# Patient Record
Sex: Female | Born: 1985 | Race: White | Hispanic: No | Smoking: Current every day smoker
Health system: Southern US, Community
[De-identification: ages and names within clinical notes are randomized; demographics above are authoritative.]

## PROBLEM LIST (undated history)

## (undated) ENCOUNTER — Inpatient Hospital Stay (HOSPITAL_COMMUNITY): Payer: Self-pay

## (undated) DIAGNOSIS — J449 Chronic obstructive pulmonary disease, unspecified: Secondary | ICD-10-CM

## (undated) DIAGNOSIS — F419 Anxiety disorder, unspecified: Secondary | ICD-10-CM

## (undated) DIAGNOSIS — F1020 Alcohol dependence, uncomplicated: Secondary | ICD-10-CM

## (undated) DIAGNOSIS — F319 Bipolar disorder, unspecified: Secondary | ICD-10-CM

## (undated) DIAGNOSIS — E669 Obesity, unspecified: Secondary | ICD-10-CM

## (undated) DIAGNOSIS — K759 Inflammatory liver disease, unspecified: Secondary | ICD-10-CM

## (undated) HISTORY — PX: TONSILLECTOMY AND ADENOIDECTOMY: SUR1326

## (undated) HISTORY — DX: Obesity, unspecified: E66.9

## (undated) HISTORY — DX: Anxiety disorder, unspecified: F41.9

## (undated) HISTORY — DX: Alcohol dependence, uncomplicated: F10.20

## (undated) HISTORY — PX: TEMPOROMANDIBULAR JOINT ARTHROPLASTY: SUR76

## (undated) HISTORY — DX: Chronic obstructive pulmonary disease, unspecified: J44.9

## (undated) HISTORY — PX: GASTRIC BYPASS: SHX52

## (undated) HISTORY — PX: CHOLECYSTECTOMY: SHX55

## (undated) HISTORY — PX: REMOVAL OF EAR TUBE: SHX6057

## (undated) HISTORY — PX: TMJ ARTHROPLASTY: SHX1066

## (undated) HISTORY — PX: OTHER SURGICAL HISTORY: SHX169

## (undated) HISTORY — PX: TUBAL LIGATION: SHX77

---
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
## 2003-01-06 ENCOUNTER — Inpatient Hospital Stay (HOSPITAL_COMMUNITY): Admission: EM | Admit: 2003-01-06 | Discharge: 2003-01-09 | Payer: Self-pay | Admitting: Emergency Medicine

## 2003-01-31 ENCOUNTER — Emergency Department (HOSPITAL_COMMUNITY): Admission: EM | Admit: 2003-01-31 | Discharge: 2003-01-31 | Payer: Self-pay | Admitting: Emergency Medicine

## 2003-06-11 ENCOUNTER — Emergency Department (HOSPITAL_COMMUNITY): Admission: EM | Admit: 2003-06-11 | Discharge: 2003-06-11 | Payer: Self-pay | Admitting: Emergency Medicine

## 2003-06-21 ENCOUNTER — Emergency Department (HOSPITAL_COMMUNITY): Admission: EM | Admit: 2003-06-21 | Discharge: 2003-06-21 | Payer: Self-pay | Admitting: Emergency Medicine

## 2004-01-03 ENCOUNTER — Emergency Department (HOSPITAL_COMMUNITY): Admission: EM | Admit: 2004-01-03 | Discharge: 2004-01-03 | Payer: Self-pay | Admitting: Emergency Medicine

## 2004-01-21 ENCOUNTER — Emergency Department (HOSPITAL_COMMUNITY): Admission: EM | Admit: 2004-01-21 | Discharge: 2004-01-21 | Payer: Self-pay | Admitting: Emergency Medicine

## 2004-05-05 ENCOUNTER — Emergency Department (HOSPITAL_COMMUNITY): Admission: EM | Admit: 2004-05-05 | Discharge: 2004-05-05 | Payer: Self-pay | Admitting: Emergency Medicine

## 2004-05-25 ENCOUNTER — Inpatient Hospital Stay (HOSPITAL_COMMUNITY): Admission: AD | Admit: 2004-05-25 | Discharge: 2004-05-25 | Payer: Self-pay | Admitting: Family Medicine

## 2004-05-28 ENCOUNTER — Inpatient Hospital Stay (HOSPITAL_COMMUNITY): Admission: AD | Admit: 2004-05-28 | Discharge: 2004-05-28 | Payer: Self-pay | Admitting: Obstetrics and Gynecology

## 2004-05-29 ENCOUNTER — Inpatient Hospital Stay (HOSPITAL_COMMUNITY): Admission: AD | Admit: 2004-05-29 | Discharge: 2004-05-29 | Payer: Self-pay | Admitting: Obstetrics & Gynecology

## 2004-06-23 ENCOUNTER — Other Ambulatory Visit: Admission: RE | Admit: 2004-06-23 | Discharge: 2004-06-23 | Payer: Self-pay | Admitting: Obstetrics and Gynecology

## 2004-09-03 ENCOUNTER — Inpatient Hospital Stay (HOSPITAL_COMMUNITY): Admission: AD | Admit: 2004-09-03 | Discharge: 2004-09-03 | Payer: Self-pay | Admitting: Obstetrics & Gynecology

## 2005-01-12 ENCOUNTER — Ambulatory Visit (HOSPITAL_COMMUNITY): Admission: RE | Admit: 2005-01-12 | Discharge: 2005-01-12 | Payer: Self-pay | Admitting: Obstetrics and Gynecology

## 2005-01-13 ENCOUNTER — Inpatient Hospital Stay (HOSPITAL_COMMUNITY): Admission: RE | Admit: 2005-01-13 | Discharge: 2005-01-15 | Payer: Self-pay | Admitting: Obstetrics and Gynecology

## 2006-01-02 ENCOUNTER — Emergency Department (HOSPITAL_COMMUNITY): Admission: EM | Admit: 2006-01-02 | Discharge: 2006-01-03 | Payer: Self-pay | Admitting: Emergency Medicine

## 2006-01-04 ENCOUNTER — Emergency Department (HOSPITAL_COMMUNITY): Admission: EM | Admit: 2006-01-04 | Discharge: 2006-01-04 | Payer: Self-pay | Admitting: Emergency Medicine

## 2006-01-06 ENCOUNTER — Inpatient Hospital Stay (HOSPITAL_COMMUNITY): Admission: AD | Admit: 2006-01-06 | Discharge: 2006-01-06 | Payer: Self-pay | Admitting: Gynecology

## 2008-03-17 ENCOUNTER — Emergency Department (HOSPITAL_COMMUNITY): Admission: EM | Admit: 2008-03-17 | Discharge: 2008-03-17 | Payer: Self-pay | Admitting: Emergency Medicine

## 2008-07-10 ENCOUNTER — Emergency Department (HOSPITAL_COMMUNITY): Admission: EM | Admit: 2008-07-10 | Discharge: 2008-07-10 | Payer: Self-pay | Admitting: Emergency Medicine

## 2008-07-25 ENCOUNTER — Inpatient Hospital Stay (HOSPITAL_COMMUNITY): Admission: AD | Admit: 2008-07-25 | Discharge: 2008-07-25 | Payer: Self-pay | Admitting: Obstetrics & Gynecology

## 2008-08-01 ENCOUNTER — Inpatient Hospital Stay (HOSPITAL_COMMUNITY): Admission: AD | Admit: 2008-08-01 | Discharge: 2008-08-01 | Payer: Self-pay | Admitting: Obstetrics and Gynecology

## 2008-08-14 ENCOUNTER — Emergency Department (HOSPITAL_COMMUNITY): Admission: EM | Admit: 2008-08-14 | Discharge: 2008-08-14 | Payer: Self-pay | Admitting: Emergency Medicine

## 2008-08-27 ENCOUNTER — Emergency Department (HOSPITAL_COMMUNITY): Admission: EM | Admit: 2008-08-27 | Discharge: 2008-08-28 | Payer: Self-pay | Admitting: Emergency Medicine

## 2008-08-30 ENCOUNTER — Ambulatory Visit (HOSPITAL_COMMUNITY): Admission: RE | Admit: 2008-08-30 | Discharge: 2008-08-30 | Payer: Self-pay | Admitting: Obstetrics and Gynecology

## 2008-09-10 ENCOUNTER — Inpatient Hospital Stay (HOSPITAL_COMMUNITY): Admission: AD | Admit: 2008-09-10 | Discharge: 2008-09-10 | Payer: Self-pay | Admitting: Obstetrics & Gynecology

## 2008-09-23 ENCOUNTER — Emergency Department (HOSPITAL_COMMUNITY): Admission: EM | Admit: 2008-09-23 | Discharge: 2008-09-23 | Payer: Self-pay | Admitting: Emergency Medicine

## 2009-01-17 ENCOUNTER — Inpatient Hospital Stay (HOSPITAL_COMMUNITY): Admission: RE | Admit: 2009-01-17 | Discharge: 2009-01-19 | Payer: Self-pay | Admitting: Obstetrics and Gynecology

## 2010-06-04 LAB — CBC
HCT: 31.1 % — ABNORMAL LOW (ref 36.0–46.0)
HCT: 38.1 % (ref 36.0–46.0)
Hemoglobin: 10.3 g/dL — ABNORMAL LOW (ref 12.0–15.0)
Hemoglobin: 12.6 g/dL (ref 12.0–15.0)
MCHC: 33.2 g/dL (ref 30.0–36.0)
MCHC: 33.2 g/dL (ref 30.0–36.0)
MCV: 82.6 fL (ref 78.0–100.0)
MCV: 83.9 fL (ref 78.0–100.0)
Platelets: 204 10*3/uL (ref 150–400)
Platelets: 221 10*3/uL (ref 150–400)
RBC: 3.71 MIL/uL — ABNORMAL LOW (ref 3.87–5.11)
RBC: 4.61 MIL/uL (ref 3.87–5.11)
RDW: 13.9 % (ref 11.5–15.5)
RDW: 14.2 % (ref 11.5–15.5)
WBC: 10 10*3/uL (ref 4.0–10.5)
WBC: 9.9 10*3/uL (ref 4.0–10.5)

## 2010-06-04 LAB — RPR: RPR Ser Ql: NONREACTIVE

## 2010-06-08 LAB — COMPREHENSIVE METABOLIC PANEL
ALT: 16 U/L (ref 0–35)
AST: 20 U/L (ref 0–37)
Albumin: 3.1 g/dL — ABNORMAL LOW (ref 3.5–5.2)
Alkaline Phosphatase: 51 U/L (ref 39–117)
BUN: 4 mg/dL — ABNORMAL LOW (ref 6–23)
CO2: 24 mEq/L (ref 19–32)
Calcium: 8.8 mg/dL (ref 8.4–10.5)
Chloride: 105 mEq/L (ref 96–112)
Creatinine, Ser: 0.48 mg/dL (ref 0.4–1.2)
GFR calc Af Amer: >60 mL/min (ref >60)
GFR calc non Af Amer: >60 mL/min (ref >60)
Glucose, Bld: 108 mg/dL — ABNORMAL HIGH (ref 70–99)
Potassium: 3.5 mEq/L (ref 3.5–5.1)
Sodium: 136 mEq/L (ref 135–145)
Total Bilirubin: 0.6 mg/dL (ref 0.3–1.2)
Total Protein: 6.1 g/dL (ref 6.0–8.3)

## 2010-06-08 LAB — CBC
HCT: 36.1 % (ref 36.0–46.0)
Hemoglobin: 12.4 g/dL (ref 12.0–15.0)
MCHC: 34.3 g/dL (ref 30.0–36.0)
MCV: 84.9 fL (ref 78.0–100.0)
Platelets: 226 10*3/uL (ref 150–400)
RBC: 4.26 MIL/uL (ref 3.87–5.11)
RDW: 13.4 % (ref 11.5–15.5)
WBC: 9 10*3/uL (ref 4.0–10.5)

## 2010-06-09 LAB — WET PREP, GENITAL
Clue Cells Wet Prep HPF POC: NONE SEEN
Trich, Wet Prep: NONE SEEN
Yeast Wet Prep HPF POC: NONE SEEN

## 2010-06-09 LAB — URINALYSIS, ROUTINE W REFLEX MICROSCOPIC
Bilirubin Urine: NEGATIVE
Glucose, UA: NEGATIVE mg/dL
Ketones, ur: 40 mg/dL — AB
Leukocytes, UA: NEGATIVE
Nitrite: NEGATIVE
Protein, ur: NEGATIVE mg/dL
Specific Gravity, Urine: 1.02 (ref 1.005–1.030)
Urobilinogen, UA: 0.2 mg/dL (ref 0.0–1.0)
pH: 7 (ref 5.0–8.0)

## 2010-06-09 LAB — URINE MICROSCOPIC-ADD ON

## 2010-06-10 LAB — URINALYSIS, ROUTINE W REFLEX MICROSCOPIC
Bilirubin Urine: NEGATIVE
Glucose, UA: NEGATIVE mg/dL
Hgb urine dipstick: NEGATIVE
Ketones, ur: NEGATIVE mg/dL
Nitrite: NEGATIVE
Protein, ur: NEGATIVE mg/dL
Specific Gravity, Urine: >1.03 — ABNORMAL HIGH (ref 1.005–1.030)
Urobilinogen, UA: 0.2 mg/dL (ref 0.0–1.0)
pH: 6 (ref 5.0–8.0)

## 2010-06-10 LAB — GC/CHLAMYDIA PROBE AMP, GENITAL
Chlamydia, DNA Probe: NEGATIVE
GC Probe Amp, Genital: NEGATIVE

## 2010-06-10 LAB — WET PREP, GENITAL
Clue Cells Wet Prep HPF POC: NONE SEEN
Trich, Wet Prep: NONE SEEN
Yeast Wet Prep HPF POC: NONE SEEN

## 2010-07-18 NOTE — Op Note (Signed)
NAMEVELINDA, Kathy Burns                 ACCOUNT NO.:  [id number]   MEDICAL RECORD NO.:  [id number]          PATIENT TYPE:  INP   LOCATION:  9125                          FACILITY:  [facility]   PHYSICIAN:  Malva Limes, M.D.    DATE OF BIRTH:  Jan 14, 1986   DATE OF PROCEDURE:  01/13/2005  DATE OF DISCHARGE:                                 OPERATIVE REPORT   PREOPERATIVE DIAGNOSIS:  1.  Intrauterine pregnancy at term.  2.  The patient strongly desires primary cesarean section.  3.  Suspected macrosomia.   POSTOPERATIVE DIAGNOSIS:  1.  Intrauterine pregnancy at term.  2.  The patient strongly desires primary cesarean section.  3.  Suspected macrosomia.   PROCEDURE:  Primary low transverse cesarean section.   SURGEON:  Earlene Plater, M.D.   ASSISTANT:  Luvenia Redden, M.D.   ANESTHESIA:  Spinal.   ANTIBIOTICS:  Ancef 1 gram.   ESTIMATED BLOOD LOSS:  900 mL.   COMPLICATIONS:  None.   SPECIMENS:  None.   FINDINGS:  The patient had normal fallopian tubes and ovaries bilaterally.  The uterus appeared to be normal.  The patient delivered one live viable  white female infant with Apgars of 9 at one minute and 9 at five minutes.  Weight was 8 pounds 5 ounces.   DESCRIPTION OF PROCEDURE:  The patient was taken to the operating room where  a spinal anesthetic was administered.  She was then placed in the dorsal  supine position with a left lateral tilt.  She was prepped with Betadine and  Foley catheter was placed.  She was then draped in the usual fashion for  this procedure.  Once an adequate level was reached, a Pfannenstiel incision  was made.  This was carried down to the fascia.  The fascia was entered in  the midline and extended laterally with the Mayo scissors.  The rectus  muscles were then dissected from the fascia with the Bovie.  The rectus  muscles were divided in the midline and taken superiorly and inferiorly.  The parietoperitoneum was entered sharply and taken superiorly  and  inferiorly.  The bladder flap was taken down sharply.  A low transverse  uterine incision was made in the midline and extended laterally with blunt  dissection.  The amniotic fluid was noted to be clear.  The infant was  delivered in the vertex presentation.  On delivery of the head, the  oropharynx and nostrils were bulb suctioned.  The remaining infant was then  delivered.  The cord was doubly clamped and cut and the infant handed to the  awaiting NICU team.  The placenta was manually removed and the uterus  exteriorized.  The uterine cavity was cleaned with a wet laparoscopy.  The  uterine incision was closed in a single layer of 0 Monocryl in a running  locking fashion.  The bladder flap was closed using 2-0 Monocryl in a  running fashion.  The uterus was placed back into the abdominal cavity.  Hemostasis was checked and found to be adequate.  Parietoperitoneum and  rectus muscles  were reapproximated in the midline using 2-0 Monocryl suture.  The rectus fascia was then closed using 0 Monocryl suture in a running  fashion.  Subcuticular tissue was made hemostatic with the Bovie.  Stainless  steel clips were used to close the skin.  The patient tolerated the  procedure well and she was taken to the recovery room in stable condition.  Needle, sponge, and instrument counts correct x2.           ______________________________  Malva Limes, M.D.     MA/MEDQ  D:  01/13/2005  T:  01/13/2005  Job:  [id number]

## 2010-07-18 NOTE — Discharge Summary (Signed)
Kathy Burns, Burns                 ACCOUNT NO.:  [id number]   MEDICAL RECORD NO.:  [id number]          Burns TYPE:  INP   LOCATION:  9125                          FACILITY:  [facility]   PHYSICIAN:  Randye Lobo, M.D.   DATE OF BIRTH:  11/25/1985   DATE OF ADMISSION:  01/13/2005  DATE OF DISCHARGE:  01/15/2005                                 DISCHARGE SUMMARY   FINAL DIAGNOSES:  1.  Intrauterine pregnancy at term.  2.  Suspected macrosomia.  3.  Kathy Burns desires primary cesarean section.   PROCEDURE:  Primary low transverse cesarean section. Surgeon Dr. Malva Limes.  Assistant Dr. Lodema Hong.   COMPLICATIONS:  None.   HISTORY OF PRESENT ILLNESS:  This 25 year old, G1, P0 presents at term  requesting a primary low transverse cesarean section. There is some  suspicion of macrosomia. Otherwise, Kathy Burns's antepartum course had been  uncomplicated. She did have a positive group B strep culture obtained in Kathy  office at 35 weeks. Kathy Burns also has a history of drug abuse, a history  of Kathy panic attacks and anxiety.  Kathy Burns was not on any medicines  during this pregnancy for her panic attacks.   HOSPITAL COURSE:  She was taken to Kathy operating room on November14,2006 by  Dr. Malva Limes where a primary low transverse cesarean section was  performed with Kathy delivery of an 8 pound 5 ounce female infant, with Apgars  of 9/9. Delivery went without complications. Kathy Burns's postoperative  course was benign without any significant fevers. She was felt ready for  discharge on postoperative day #2.   DIET:  She was sent home on a regular diet.   ACTIVITY:  Told to decrease activities.   DISCHARGE MEDICATIONS:  1.  She was told to continue her prenatal vitamins, and to make sure she is      getting enough calcium.  2.  She was given Percocet one to two every 4-6 hours as needed for pain.  3.  Told she could use ibuprofen up to 600 milligrams every 6 hours as  needed for pain.   FOLLOW UP:  She was to follow up in Kathy office in 4 weeks.   LABORATORIES ON DISCHARGE:  Kathy Burns had a hemoglobin of 11.2, white  blood cell count of 10.4 and platelets of 193,000.      Kathy Burns, P.A.-C.      Randye Lobo, M.D.  Electronically Signed    MB/MEDQ  D:  02/04/2005  T:  02/04/2005  Job:  [id number]
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
## 2010-07-18 NOTE — Discharge Summary (Signed)
Kathy Burns, Kathy Burns                             ACCOUNT NO.:  [id number]   MEDICAL RECORD NO.:  [id number]                   PATIENT TYPE:  LINP   LOCATION:                                       FACILITY:  [facility]   PHYSICIAN:  Katy Fitch. Sypher, M.D.              DATE OF BIRTH:  Oct 23, 1985   DATE OF ADMISSION:  01/06/2003  DATE OF DISCHARGE:  01/09/2003                                 DISCHARGE SUMMARY   ADMISSION DIAGNOSES:  1. Cellulitis, left hand and dorsal forearm secondary to history of     intravenous cocaine abuse.  2. Severe chronic and progressive cocaine abuse.  3. Relapse from substance abuse program.   OPERATION PERFORMED:  None.   CONSULTATIONS:  Psychiatry.   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  She spent  approximately eight month in a drug rehab program with a recent apparent  relapse.  She presented to [facility] Emergency Room with 3+  cellulitis of the left hand and dorsal forearm.  She was seen by the  emergency room physicians and a hand consultation was ordered.  They started  the patient on IV Unasyn.  Exam revealed 3+ cellulitis of the left hand with  multiple needle tracks on the dorsal and lower aspect of the left hand and  arm.  It was felt that the patient would need to be admitted.  Placed on IV  Unasyn and obtain a behavioral medicine consult.   LABORATORY DATA:  Hemoglobin of 13.3, hematocrit 39.7, white blood cell  count 12.9, and 333,000 platelets.  Differential reveals elevated  neutrophils at 79%, lymphs were low at 11, and monos were slightly high at  1.1.  X-ray of the left hand revealed no acute bony abnormality.   HOSPITAL COURSE:  The patient was seen and evaluated by Dr. Teressa Senter in the  emergency room.  She was admitted with the diagnosis of cellulitis of the  left  hand and forearm secondary to multiple injections of cocaine.  She was  placed on Unasyn 3 g IV q.6h., given Percocet for pain.  Started on the  Librium protocol for substance withdrawal, and a behavioral medicine consult  was called to the psychiatrist on call.  She was seen on the same day by  psychiatry.  They placed the patient on Ativan 2 mg IM or p.o. q.6h. p.r.n.  and Seroquel 100 mg p.o. q. day, q.4h. p.r.n. anxiety and agitation.  Apparently, they did not feel that she was a threat to herself.  The second  day postop on January 07, 2003, she was afebrile with stable vital signs.  The patient had been quite manipulative of the staff, disrupting the  patients care.  Maximum sedation was  supervised by Dr. Dub Mikes who is the  treating psychiatrist.  The patient was seen by Dr. Teressa Senter and the floor  nurse.  The patient requested IM meds, but this was discontinued.  On exam,  the patient was 4+ sedated, she was much calmer overall.  Complaining about  Dr. Dub Mikes not medicated her enough.  Left hand revealed decreased rubor and  edema.  Range of motion was 50% improved.  There was no signs of abscess  formation.  At this time, he felt that there was no indication for IV  morphine noted, felt that it was unsafe, and Dr. Stark Jock judgment with her  present level of sedation.  There was a great deal of improvement of the  cellulitis.  Her Unasyn was continued.  Her IM meds were discontinued.  We  felt that p.o. meds were reasonable for her pain control.  The patient was  started on a K-Pad for her left hand and arm.  The following day on January 08, 2003, the patient was complaining of severe pain of the left upper  extremity that p.o. medications were not relieving.  She stated that she  wished she could just get a shot in her IV to relax all her limbs and it  made her feel good and the pain goes away.  She had a fair appetite, she  was voiding.  On exam, she had 1+ swelling of the dorsum of the  hand.  There  was no lymphangitis.  Neurovascularly, she was grossly intact.  We discussed  with her the need to continue to elevate her left upper extremity.  Would  continue p.o. medications for pain only in anticipation of discharge home.  We spoke at length with Kathy Burns, R.N., case manager, concerning the  need for p.o. Augmentin for discharge.  On January 09, 2003, the patient  remained afebrile with stable vital signs.  She continued to complain of  moderate to severe pain.  She was taking a combination of Dilaudid, Ativan,  and Seroquel for pain relief.  She stated that she was so drowsy, but just  cannot sleep, wants something to kill all the pain.  On exam, she had less  swelling than on the previous day.  There was no erythema or lymphangitis.  She had full range of motion fo her digits, wrists, and elbow.  Plan at this  time:  It was felt that the patient was stable and ready for discharge home.  She was given a prescription for Tylox 5 mg.  After a lengthy discussion  concerning her pain she felt that this would be adequate for her.  Next was  Augmentin 875 mg #10 one p.o. b.i.d.  This prescription was filled by case  Production designer, theatre/television/film and social services at [facility].   ACTIVITY:  The patient will continue to keep her hand elevated and work on  range of motion of her fingers.   FOLLOWUP:  She will return to see Dr. Teressa Senter on Monday, January 15, 2003.  She will call [phone] for an appointment.   DIET:  No restrictions.   WOUND CARE:  She will keep her hand elevated.   CONDITION ON DISCHARGE:  Stable and improving.   FINAL DISCHARGE DIAGNOSIS:  Cellulitis of left upper extremity related to a  history of intravenous cocaine abuse.     Kathy Burns. Dasnoit, P.A.                   Katy Fitch  Sypher, M.D.    RJD/MEDQ  D:  02/15/2003  T:  02/15/2003  Job:  [id number]

## 2010-09-22 IMAGING — US US OB COMP +14 WK
2 series · 14 of 28 positions shown · non-contrast
Comparison: none

OBSTETRICAL ULTRASOUND:
 This ultrasound exam was performed in the [HOSPITAL] Ultrasound Department.  The OB US report was generated in the AS system, and faxed to the ordering physician.  This report is also available in [REDACTED] PACS.

[Series 1: us ob comp +14 wk · 12 of 47 slices shown (1 of 2)]
[im 3/47]
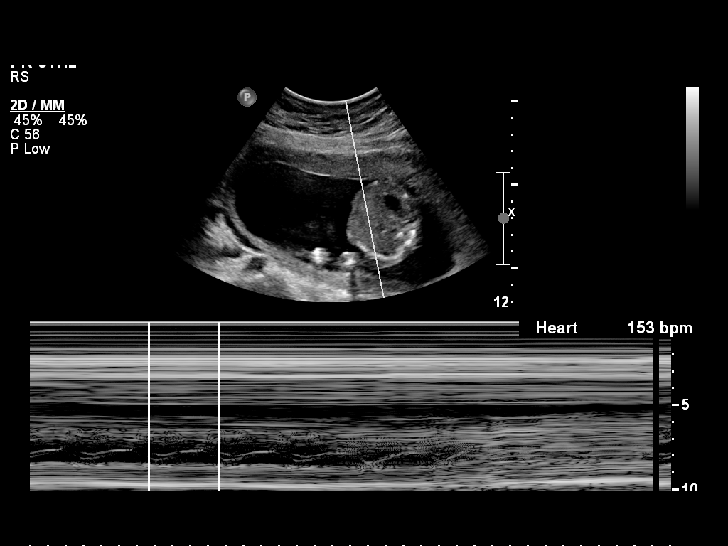
[im 7/47]
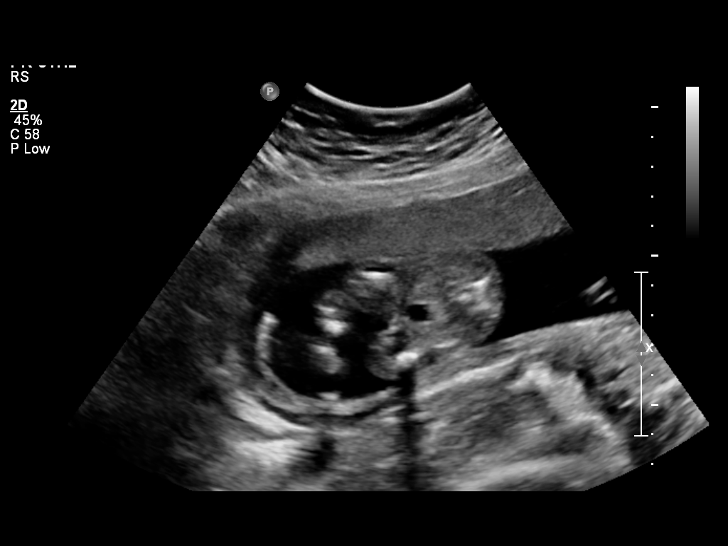
[im 11/47]
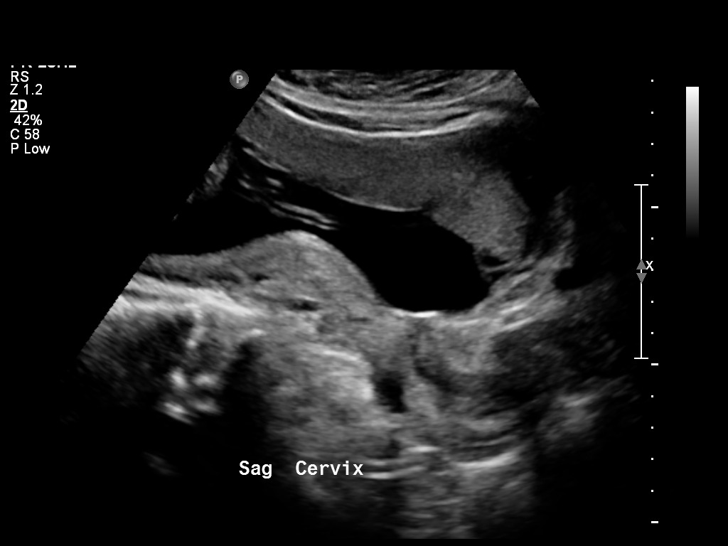
[im 15/47]
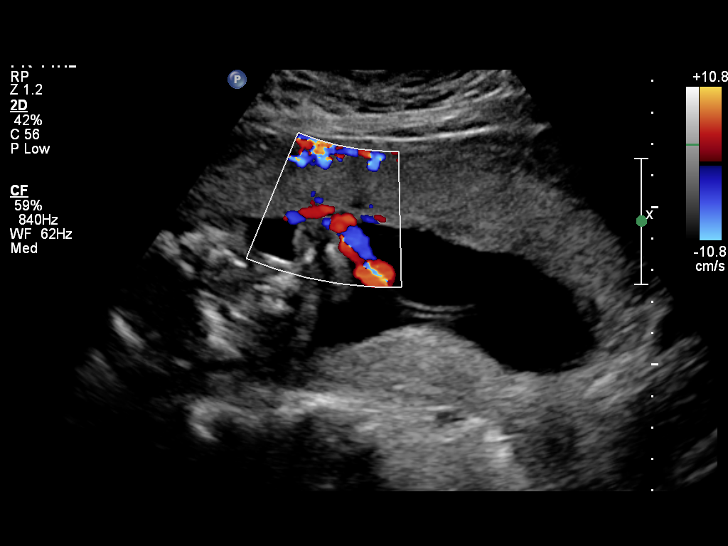
[im 19/47]
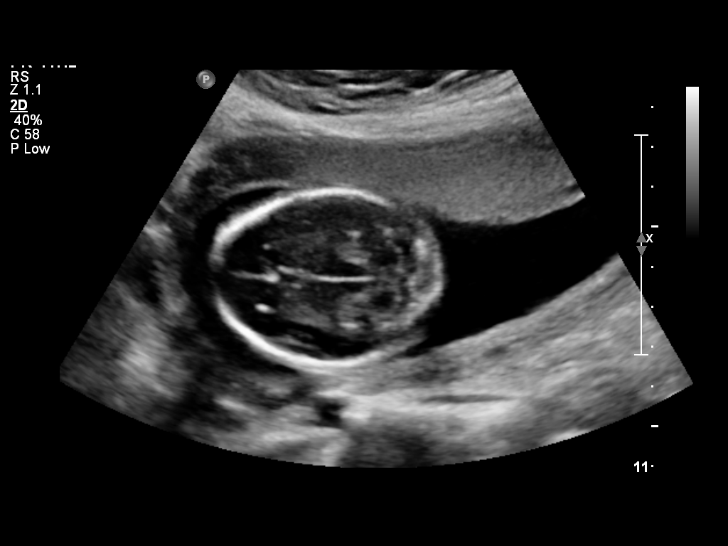
[im 23/47]
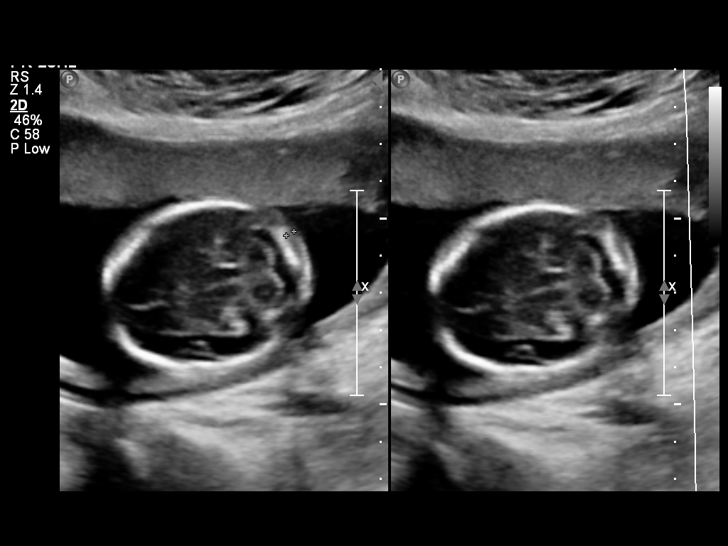
[im 27/47]
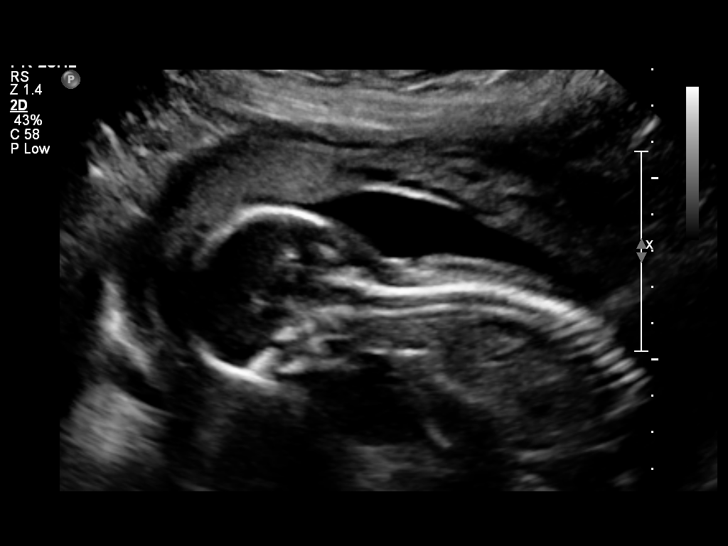
[im 31/47]
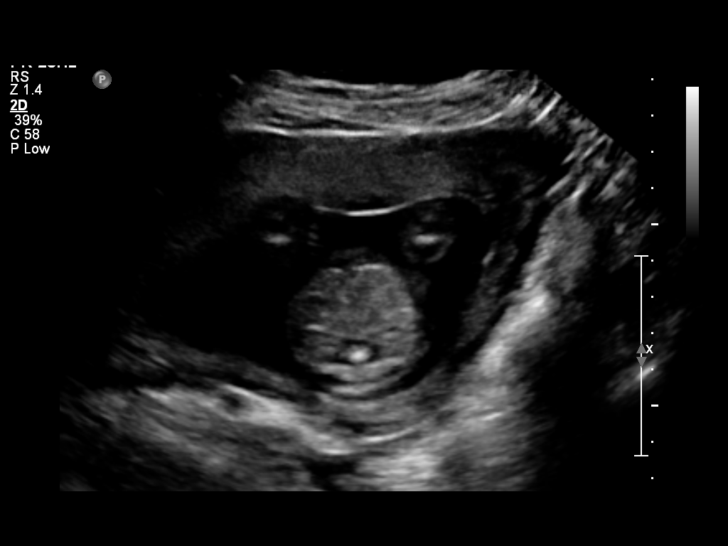
[im 35/47]
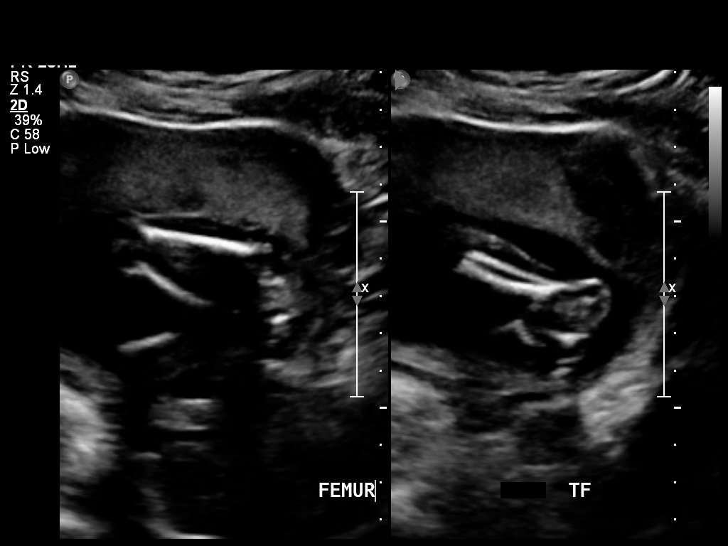
[im 39/47]
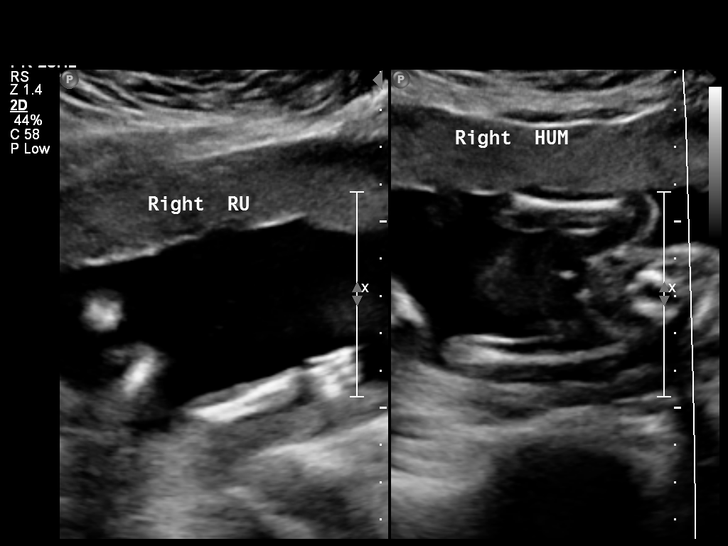
[im 43/47]
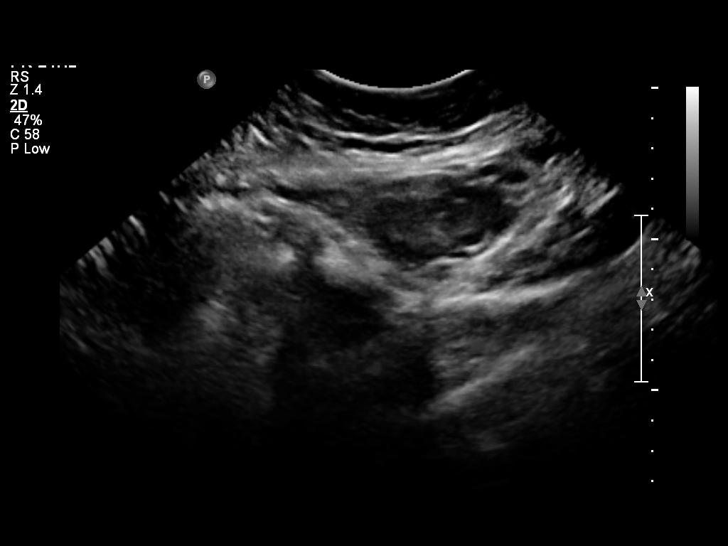
[im 47/47]
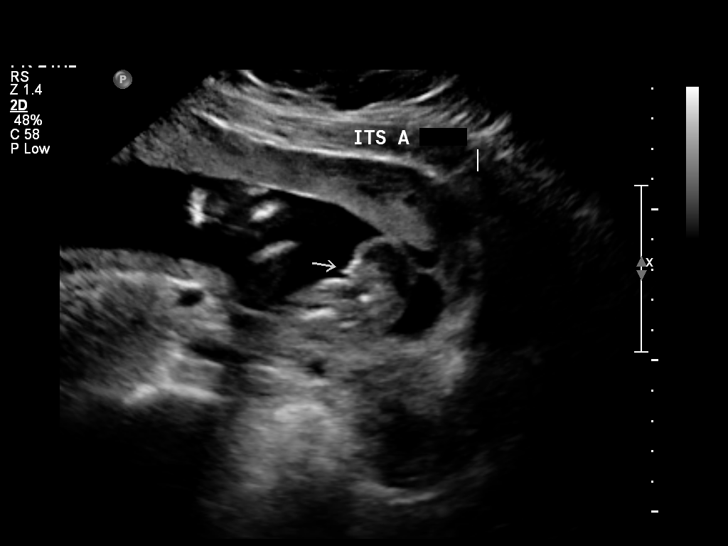

[Series 1: us ob comp +14 wk · 0.14mm/px · 2 of 7 slices shown (2 of 2)]
[im 3/7]
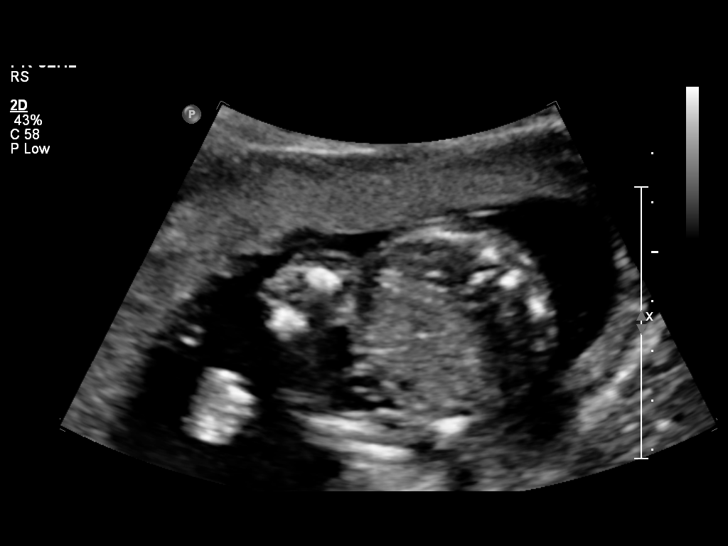
[im 7/7]
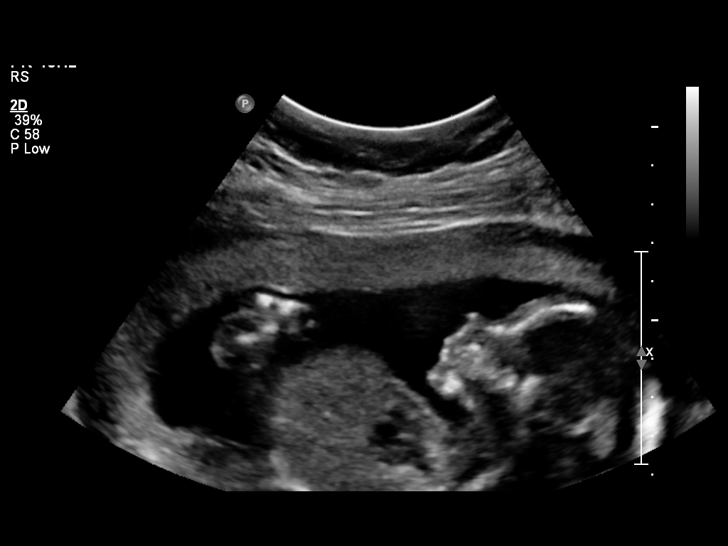

[14 of 28 positions shown; findings below may reference images not displayed]

IMPRESSION: See AS Obstetric US report.

## 2012-02-12 ENCOUNTER — Encounter (HOSPITAL_COMMUNITY): Payer: Self-pay | Admitting: *Deleted

## 2012-02-12 ENCOUNTER — Emergency Department (HOSPITAL_COMMUNITY)
Admission: EM | Admit: 2012-02-12 | Discharge: 2012-02-12 | Disposition: A | Discharge status: Home / Self Care | Payer: Self-pay | Attending: Emergency Medicine | Admitting: Emergency Medicine

## 2012-02-12 DIAGNOSIS — R209 Unspecified disturbances of skin sensation: Secondary | ICD-10-CM | POA: Insufficient documentation

## 2012-02-12 DIAGNOSIS — F1910 Other psychoactive substance abuse, uncomplicated: Secondary | ICD-10-CM

## 2012-02-12 DIAGNOSIS — F1110 Opioid abuse, uncomplicated: Secondary | ICD-10-CM | POA: Insufficient documentation

## 2012-02-12 DIAGNOSIS — F319 Bipolar disorder, unspecified: Secondary | ICD-10-CM | POA: Insufficient documentation

## 2012-02-12 DIAGNOSIS — R202 Paresthesia of skin: Secondary | ICD-10-CM

## 2012-02-12 DIAGNOSIS — F17200 Nicotine dependence, unspecified, uncomplicated: Secondary | ICD-10-CM | POA: Insufficient documentation

## 2012-02-12 HISTORY — DX: Bipolar disorder, unspecified: F31.9

## 2012-02-12 LAB — GLUCOSE, CAPILLARY: Glucose-Capillary: 108 mg/dL — ABNORMAL HIGH (ref 70–99)

## 2012-02-12 NOTE — ED Provider Notes (Signed)
History     CSN: [id number]  Arrival date & time 02/12/12  1849   None     Chief Complaint  Patient presents with  . Tingling    (Consider location/radiation/quality/duration/timing/severity/associated sxs/prior treatment) HPI  Kathy Burns is a 26 y.o. female complaining of left hand paraesthesia to all 5 digits radiating into the wrist and up the ulnar side of the arm. This has been happening off and on for 2 weeks. Nothing she does makes it better or worse. Patient is IV drug user she injects heroin into the left arm she states that she has tried to stop injecting into the left arm. She denies any fever, back pain, neck pain, shortness of breath or rashes.   Past Medical History  Diagnosis Date  . Bipolar 1 disorder     History reviewed. No pertinent past surgical history.  No family history on file.  History  Substance Use Topics  . Smoking status: Current Every Day Smoker  . Smokeless tobacco: Not on file  . Alcohol Use: No    OB History    Grav Para Term Preterm Abortions TAB SAB Ect Mult Living                  Review of Systems  Constitutional: Negative for fever.  Respiratory: Negative for shortness of breath.   Cardiovascular: Negative for chest pain.  Gastrointestinal: Negative for nausea, vomiting, abdominal pain and diarrhea.  Neurological: Positive for numbness.  All other systems reviewed and are negative.    Allergies  Broccoli and Codeine  Home Medications  No current outpatient prescriptions on file.  BP 119/65  Pulse 79  Temp 98.8 F (37.1 C) (Oral)  Resp 18  SpO2 99%  Physical Exam  Nursing note and vitals reviewed. Constitutional: She is oriented to person, place, and time. She appears well-developed and well-nourished. No distress.  HENT:  Head: Normocephalic.  Eyes: Conjunctivae normal and EOM are normal. Pupils are equal, round, and reactive to light.  Neck: Normal range of motion.  Cardiovascular: Normal rate.   No  murmur heard. Pulmonary/Chest: Effort normal. No stridor.  Musculoskeletal: Normal range of motion.       Patient has full range of motion to left wrist and all digits. Tinel and Phalen are negative.  Neurological: She is alert and oriented to person, place, and time.  Skin:       Non-infected injection marks to right and left arm. No induration, erythema, warmth, tenderness to palpation.  Psychiatric: She has a normal mood and affect.    ED Course  Procedures (including critical care time)  Labs Reviewed  GLUCOSE, CAPILLARY - Abnormal; Notable for the following:    Glucose-Capillary 108 (*)     All other components within normal limits   No results found.   1. Paresthesia of left arm   2. IV drug abuse       MDM   Reassured patient that she is not having a stroke. Advise cessation of IV drug use. Discussed case with attending who agrees with stability to discharge to home. Outpatient resource guide for both primary care and drug detox given        Wynetta Emery, [name] 02/12/12 2217

## 2012-02-12 NOTE — ED Notes (Signed)
Pt reports tingling in left fingers and left arm. Thinks that she has diabetes, states cuts/wounds aren't healing.

## 2012-03-02 NOTE — ED Provider Notes (Signed)
Medical screening examination/treatment/procedure(s) were performed by non-physician practitioner and as supervising physician I was immediately available for consultation/collaboration.  Toy Baker, MD 03/02/12 1536

## 2012-03-17 NOTE — ED Provider Notes (Signed)
Medical screening examination/treatment/procedure(s) were performed by non-physician practitioner and as supervising physician I was immediately available for consultation/collaboration.  Toy Baker, MD 03/17/12 [phone]

## 2012-09-10 ENCOUNTER — Emergency Department (HOSPITAL_COMMUNITY)
Admission: EM | Admit: 2012-09-10 | Discharge: 2012-09-13 | Disposition: A | Discharge status: Home / Self Care | Payer: Self-pay | Attending: Emergency Medicine | Admitting: Emergency Medicine

## 2012-09-10 ENCOUNTER — Encounter (HOSPITAL_COMMUNITY): Payer: Self-pay | Admitting: *Deleted

## 2012-09-10 DIAGNOSIS — Z8659 Personal history of other mental and behavioral disorders: Secondary | ICD-10-CM | POA: Insufficient documentation

## 2012-09-10 DIAGNOSIS — Z3202 Encounter for pregnancy test, result negative: Secondary | ICD-10-CM | POA: Insufficient documentation

## 2012-09-10 DIAGNOSIS — F1120 Opioid dependence, uncomplicated: Secondary | ICD-10-CM | POA: Insufficient documentation

## 2012-09-10 DIAGNOSIS — F1110 Opioid abuse, uncomplicated: Secondary | ICD-10-CM

## 2012-09-10 DIAGNOSIS — F17200 Nicotine dependence, unspecified, uncomplicated: Secondary | ICD-10-CM | POA: Insufficient documentation

## 2012-09-10 DIAGNOSIS — F1910 Other psychoactive substance abuse, uncomplicated: Secondary | ICD-10-CM

## 2012-09-10 DIAGNOSIS — F1123 Opioid dependence with withdrawal: Secondary | ICD-10-CM

## 2012-09-10 LAB — RAPID URINE DRUG SCREEN, HOSP PERFORMED
Amphetamines: NOT DETECTED
Opiates: POSITIVE — AB

## 2012-09-10 LAB — COMPREHENSIVE METABOLIC PANEL
Alkaline Phosphatase: 93 U/L (ref 39–117)
BUN: 13 mg/dL (ref 6–23)
Chloride: 103 mEq/L (ref 96–112)
Creatinine, Ser: 0.69 mg/dL (ref 0.50–1.10)
GFR calc Af Amer: >90 mL/min (ref >90)
GFR calc non Af Amer: >90 mL/min (ref >90)
Glucose, Bld: 118 mg/dL — ABNORMAL HIGH (ref 70–99)
Potassium: 3.8 mEq/L (ref 3.5–5.1)
Total Bilirubin: 0.4 mg/dL (ref 0.3–1.2)

## 2012-09-10 LAB — GLUCOSE, CAPILLARY: Glucose-Capillary: 96 mg/dL (ref 70–99)

## 2012-09-10 LAB — CBC
HCT: 43.3 % (ref 36.0–46.0)
Hemoglobin: 14.4 g/dL (ref 12.0–15.0)
MCV: 80 fL (ref 78.0–100.0)
RDW: 13.6 % (ref 11.5–15.5)
WBC: 8.1 10*3/uL (ref 4.0–10.5)

## 2012-09-10 LAB — ETHANOL: Alcohol, Ethyl (B): <11 mg/dL (ref 0–11)

## 2012-09-10 LAB — POCT PREGNANCY, URINE: Preg Test, Ur: NEGATIVE

## 2012-09-10 MED ORDER — SODIUM CHLORIDE 0.9 % IV SOLN
Freq: Once | INTRAVENOUS | Status: AC
  Administered 2012-09-10: 04:00:00 via INTRAVENOUS

## 2012-09-10 MED ORDER — METHOCARBAMOL 500 MG PO TABS
500.0000 mg | ORAL_TABLET | Freq: Three times a day (TID) | ORAL | Status: DC | PRN
  Administered 2012-09-10 – 2012-09-11 (×4): 500 mg via ORAL
  Filled 2012-09-10 (×3): qty 1

## 2012-09-10 MED ORDER — HYDROXYZINE HCL 25 MG PO TABS: 25.0000 mg | ORAL_TABLET | Freq: Four times a day (QID) | ORAL | Status: DC | PRN

## 2012-09-10 MED ORDER — CLONIDINE HCL 0.1 MG PO TABS
0.1000 mg | ORAL_TABLET | ORAL | Status: DC
  Administered 2012-09-12 – 2012-09-13 (×3): 0.1 mg via ORAL
  Filled 2012-09-10 (×3): qty 1

## 2012-09-10 MED ORDER — NAPROXEN 500 MG PO TABS
500.0000 mg | ORAL_TABLET | Freq: Two times a day (BID) | ORAL | Status: DC | PRN
  Administered 2012-09-10 – 2012-09-11 (×3): 500 mg via ORAL
  Filled 2012-09-10 (×2): qty 1

## 2012-09-10 MED ORDER — ONDANSETRON 4 MG PO TBDP: 4.0000 mg | ORAL_TABLET | Freq: Four times a day (QID) | ORAL | Status: DC | PRN

## 2012-09-10 MED ORDER — DICYCLOMINE HCL 20 MG PO TABS: 20.0000 mg | ORAL_TABLET | Freq: Four times a day (QID) | ORAL | Status: DC | PRN

## 2012-09-10 MED ORDER — DIPHENHYDRAMINE HCL 25 MG PO CAPS
25.0000 mg | ORAL_CAPSULE | Freq: Once | ORAL | Status: AC
  Administered 2012-09-11: 25 mg via ORAL
  Filled 2012-09-10: qty 1

## 2012-09-10 MED ORDER — CLONIDINE HCL 0.1 MG PO TABS: 0.1000 mg | ORAL_TABLET | ORAL | Status: DC

## 2012-09-10 MED ORDER — CLONIDINE HCL 0.1 MG PO TABS
0.1000 mg | ORAL_TABLET | Freq: Four times a day (QID) | ORAL | Status: AC
  Administered 2012-09-10 – 2012-09-11 (×5): 0.1 mg via ORAL
  Filled 2012-09-10 (×5): qty 1

## 2012-09-10 MED ORDER — CLONIDINE HCL 0.1 MG PO TABS: 0.1000 mg | ORAL_TABLET | Freq: Every day | ORAL | Status: DC

## 2012-09-10 MED ORDER — LOPERAMIDE HCL 2 MG PO CAPS
2.0000 mg | ORAL_CAPSULE | ORAL | Status: DC | PRN
  Administered 2012-09-13: 2 mg via ORAL
  Filled 2012-09-10: qty 1

## 2012-09-10 MED ORDER — NAPROXEN 500 MG PO TABS
500.0000 mg | ORAL_TABLET | Freq: Two times a day (BID) | ORAL | Status: DC | PRN
  Filled 2012-09-10: qty 1

## 2012-09-10 MED ORDER — CLONIDINE HCL 0.1 MG PO TABS: 0.1000 mg | ORAL_TABLET | Freq: Four times a day (QID) | ORAL | Status: DC

## 2012-09-10 MED ORDER — CLONIDINE HCL 0.1 MG PO TABS
0.1000 mg | ORAL_TABLET | Freq: Every day | ORAL | Status: DC
  Filled 2012-09-10: qty 1

## 2012-09-10 MED ORDER — METHOCARBAMOL 500 MG PO TABS
500.0000 mg | ORAL_TABLET | Freq: Three times a day (TID) | ORAL | Status: DC | PRN
  Filled 2012-09-10: qty 1

## 2012-09-10 MED ORDER — LORAZEPAM 2 MG/ML IJ SOLN
1.0000 mg | Freq: Once | INTRAMUSCULAR | Status: AC
  Administered 2012-09-11: 1 mg via INTRAMUSCULAR
  Filled 2012-09-10: qty 1

## 2012-09-10 MED ORDER — HYDROXYZINE HCL 25 MG PO TABS
25.0000 mg | ORAL_TABLET | Freq: Four times a day (QID) | ORAL | Status: DC | PRN
  Administered 2012-09-10 – 2012-09-13 (×5): 25 mg via ORAL
  Filled 2012-09-10 (×6): qty 1

## 2012-09-10 MED ORDER — LOPERAMIDE HCL 2 MG PO CAPS: 2.0000 mg | ORAL_CAPSULE | ORAL | Status: DC | PRN

## 2012-09-10 NOTE — ED Notes (Signed)
Laying in bed restless, milk and crackers given

## 2012-09-10 NOTE — ED Notes (Signed)
Pt is still complaining of a lot of pain/body aches. BP is too low to give clonidine protocol.

## 2012-09-10 NOTE — ED Notes (Signed)
Dr Arfeen into see 

## 2012-09-10 NOTE — ED Notes (Signed)
Misty Stanley RN charge spoke with Dr Dierdre Highman about pt.

## 2012-09-10 NOTE — ED Notes (Signed)
Pt accidentally pulled IV out,

## 2012-09-10 NOTE — ED Notes (Signed)
Up to the bathroom 

## 2012-09-10 NOTE — Consult Note (Signed)
Reason for Consult: Evaluation for inpatient treatment Referring Physician:  EDP  Kathy Burns is an 27 y.o. female.  HPI:  Patient presents to [facility] wanting detox from heroin and pain pills.  Patient states that she needs help.  Patient denies suicidal ideations, homicidal ideations, psychosis, and paranoia.  Patient states that she is not SI at this time but unsure of what she will do if she is discharged at this state.  Patient does have a history of suicide attempt in past.  Past Medical History  Diagnosis Date  . Bipolar 1 disorder     History reviewed. No pertinent past surgical history.  History reviewed. No pertinent family history.  Social History:  reports that she has been smoking Cigarettes.  She has been smoking about 0.00 packs per day. She does not have any smokeless tobacco history on file. She reports that she uses illicit drugs (Marijuana and Cocaine). She reports that she does not drink alcohol.  Allergies:  Allergies  Allergen Reactions  . Broccoli (Brassica Oleracea Italica) Anaphylaxis, Itching and Swelling    Only raw broccoli causes reaction:  . Codeine Itching    Medications: I have reviewed the patient's current medications.  Results for orders placed during the hospital encounter of 09/10/12 (from the past 48 hour(s))  GLUCOSE, CAPILLARY     Status: None   Collection Time    09/10/12  3:38 AM      Result Value Range   Glucose-Capillary 96  70 - 99 mg/dL   Comment 1 Documented in Chart     Comment 2 Notify RN    CBC     Status: Abnormal   Collection Time    09/10/12  3:57 AM      Result Value Range   WBC 8.1  4.0 - 10.5 K/uL   RBC 5.41 (*) 3.87 - 5.11 MIL/uL   Hemoglobin 14.4  12.0 - 15.0 g/dL   HCT 16.1  09.6 - 04.5 %   MCV 80.0  78.0 - 100.0 fL   MCH 26.6  26.0 - 34.0 pg   MCHC 33.3  30.0 - 36.0 g/dL   RDW 40.9  81.1 - 91.4 %   Platelets 264  150 - 400 K/uL  COMPREHENSIVE METABOLIC PANEL     Status: Abnormal   Collection Time    09/10/12   3:57 AM      Result Value Range   Sodium 139  135 - 145 mEq/L   Potassium 3.8  3.5 - 5.1 mEq/L   Chloride 103  96 - 112 mEq/L   CO2 28  19 - 32 mEq/L   Glucose, Bld 118 (*) 70 - 99 mg/dL   BUN 13  6 - 23 mg/dL   Creatinine, Ser 7.82  0.50 - 1.10 mg/dL   Calcium 95.6  8.4 - 21.3 mg/dL   Total Protein 7.9  6.0 - 8.3 g/dL   Albumin 3.7  3.5 - 5.2 g/dL   AST 23  0 - 37 U/L   ALT 27  0 - 35 U/L   Alkaline Phosphatase 93  39 - 117 U/L   Total Bilirubin 0.4  0.3 - 1.2 mg/dL   GFR calc non Af Amer >90  >90 mL/min   GFR calc Af Amer >90  >90 mL/min   Comment:            The eGFR has been calculated     using the CKD EPI equation.     This calculation  has not been     validated in all clinical     situations.     eGFR's persistently     <90 mL/min signify     possible Chronic Kidney Disease.  ETHANOL     Status: None   Collection Time    09/10/12  3:57 AM      Result Value Range   Alcohol, Ethyl (B) <11  0 - 11 mg/dL   Comment:            LOWEST DETECTABLE LIMIT FOR     SERUM ALCOHOL IS 11 mg/dL     FOR MEDICAL PURPOSES ONLY  URINE RAPID DRUG SCREEN (HOSP PERFORMED)     Status: Abnormal   Collection Time    09/10/12  5:03 AM      Result Value Range   Opiates POSITIVE (*) NONE DETECTED   Cocaine POSITIVE (*) NONE DETECTED   Benzodiazepines NONE DETECTED  NONE DETECTED   Amphetamines NONE DETECTED  NONE DETECTED   Tetrahydrocannabinol NONE DETECTED  NONE DETECTED   Barbiturates NONE DETECTED  NONE DETECTED   Comment:            DRUG SCREEN FOR MEDICAL PURPOSES     ONLY.  IF CONFIRMATION IS NEEDED     FOR ANY PURPOSE, NOTIFY LAB     WITHIN 5 DAYS.                LOWEST DETECTABLE LIMITS     FOR URINE DRUG SCREEN     Drug Class       Cutoff (ng/mL)     Amphetamine      1000     Barbiturate      200     Benzodiazepine   200     Tricyclics       300     Opiates          300     Cocaine          300     THC              50  POCT PREGNANCY, URINE     Status: None    Collection Time    09/10/12  5:13 AM      Result Value Range   Preg Test, Ur NEGATIVE  NEGATIVE   Comment:            THE SENSITIVITY OF THIS     METHODOLOGY IS >24 mIU/mL    No results found.  Review of Systems  Gastrointestinal: Positive for nausea.  Neurological: Positive for tremors.  Psychiatric/Behavioral: Positive for substance abuse (Heroin and pain pills). Negative for depression, suicidal ideas (Denies but states "if I am discharged like this I don't know what I might do.") and hallucinations. The patient is not nervous/anxious.    Blood pressure 108/62, pulse 61, temperature 97.7 F (36.5 C), temperature source Oral, resp. rate 15, SpO2 98.00%. Physical Exam  Constitutional: She is oriented to person, place, and time. She appears well-developed and well-nourished.  HENT:  Head: Normocephalic and atraumatic.  Neck: Normal range of motion.  Respiratory: Effort normal.  Musculoskeletal: Normal range of motion.  Neurological: She is alert and oriented to person, place, and time.  Psychiatric: Her mood appears anxious. She is not actively hallucinating. Thought content is not paranoid. She expresses no homicidal and no suicidal ideation.  Patient showing symptoms of withdrawal.   Face to face interview and consulted with Dr. Lolly Mustache  Assessment/Plan: Axis I: Substance Abuse and Heroin Withdrawal Axis II: Deferred Axis III:  Past Medical History  Diagnosis Date  . Bipolar 1 disorder    Axis IV: economic problems, housing problems, other psychosocial or environmental problems and problems related to social environment Axis V: 51-60 moderate symptoms  Recommendation:  Keep patient in ED to assist with withdrawal and monitor over night.  Outpatient resources and referrals to be given to patient.      Rankin, Shuvon, FNP-BC 09/10/2012, 9:56 AM     I have personally seen the patient and agreed with the findings and involved in the treatment plan. Kathryne Sharper, MD
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
## 2012-09-10 NOTE — ED Notes (Addendum)
Pt ambulated to restroom without difficulty and changed scrubs.  Pt sts she wants detox.  This RN was informed that Pt will probably not qualify for Inpatient care.

## 2012-09-10 NOTE — ED Provider Notes (Signed)
History    CSN: [id number] Arrival date & time 09/10/12  0305  First MD Initiated Contact with Patient 09/10/12 0347     Chief Complaint  Patient presents with  . Medical Clearance   (Consider location/radiation/quality/duration/timing/severity/associated sxs/prior Treatment) HPI History provided by patient. Has been using heroin for some time and is here requesting detox. Last use about 6 hours prior to arrival. States she uses heroin about 3 times a day. She has gone through detox in the past when she was incarcerated and states she's having the same symptoms of diaphoresis, twitching and body aches. No vomiting or diarrhea. She also uses cocaine and marijuana but denies any other ingestions or drug use. She denies alcohol use. No suicidal or homicidal ideation. No history of seizures. Symptoms moderate to severe.  Past Medical History  Diagnosis Date  . Bipolar 1 disorder    History reviewed. No pertinent past surgical history. History reviewed. No pertinent family history. History  Substance Use Topics  . Smoking status: Current Every Day Smoker    Types: Cigarettes  . Smokeless tobacco: Not on file  . Alcohol Use: No   OB History   Grav Para Term Preterm Abortions TAB SAB Ect Mult Living                 Review of Systems  Constitutional: Negative for fever and chills.  HENT: Negative for neck pain and neck stiffness.   Eyes: Negative for visual disturbance.  Respiratory: Negative for shortness of breath.   Cardiovascular: Negative for chest pain.  Gastrointestinal: Negative for vomiting and diarrhea.  Genitourinary: Negative for dysuria and flank pain.  Musculoskeletal: Negative for back pain.  Skin: Negative for rash.  Neurological: Negative for headaches.  All other systems reviewed and are negative.    Allergies  Broccoli and Codeine  Home Medications  No current outpatient prescriptions on file. BP 102/51  Pulse 58  Temp(Src) 97.7 F (36.5 C) (Oral)   Resp 16  SpO2 98% Physical Exam  Constitutional: She is oriented to person, place, and time. She appears well-developed and well-nourished.  HENT:  Head: Normocephalic and atraumatic.  Mouth/Throat: Oropharynx is clear and moist.  Eyes: EOM are normal. Pupils are equal, round, and reactive to light.  Neck: Neck supple.  Cardiovascular: Regular rhythm and intact distal pulses.   Heart rate 50s  Pulmonary/Chest: Effort normal and breath sounds normal. No respiratory distress. She exhibits no tenderness.  Abdominal: Soft. She exhibits no distension. There is no tenderness.  Musculoskeletal: Normal range of motion. She exhibits no edema.  Neurological: She is alert and oriented to person, place, and time. No cranial nerve deficit.  Skin: Skin is warm and dry.    ED Course  Procedures (including critical care time)  Results for orders placed during the hospital encounter of 09/10/12  CBC      Result Value Range   WBC 8.1  4.0 - 10.5 K/uL   RBC 5.41 (*) 3.87 - 5.11 MIL/uL   Hemoglobin 14.4  12.0 - 15.0 g/dL   HCT 78.2  95.6 - 21.3 %   MCV 80.0  78.0 - 100.0 fL   MCH 26.6  26.0 - 34.0 pg   MCHC 33.3  30.0 - 36.0 g/dL   RDW 08.6  57.8 - 46.9 %   Platelets 264  150 - 400 K/uL  COMPREHENSIVE METABOLIC PANEL      Result Value Range   Sodium 139  135 - 145 mEq/L   Potassium  3.8  3.5 - 5.1 mEq/L   Chloride 103  96 - 112 mEq/L   CO2 28  19 - 32 mEq/L   Glucose, Bld 118 (*) 70 - 99 mg/dL   BUN 13  6 - 23 mg/dL   Creatinine, Ser 0.27  0.50 - 1.10 mg/dL   Calcium 25.3  8.4 - 66.4 mg/dL   Total Protein 7.9  6.0 - 8.3 g/dL   Albumin 3.7  3.5 - 5.2 g/dL   AST 23  0 - 37 U/L   ALT 27  0 - 35 U/L   Alkaline Phosphatase 93  39 - 117 U/L   Total Bilirubin 0.4  0.3 - 1.2 mg/dL   GFR calc non Af Amer >90  >90 mL/min   GFR calc Af Amer >90  >90 mL/min  ETHANOL      Result Value Range   Alcohol, Ethyl (B) <11  0 - 11 mg/dL  URINE RAPID DRUG SCREEN (HOSP PERFORMED)      Result Value Range    Opiates POSITIVE (*) NONE DETECTED   Cocaine POSITIVE (*) NONE DETECTED   Benzodiazepines NONE DETECTED  NONE DETECTED   Amphetamines NONE DETECTED  NONE DETECTED   Tetrahydrocannabinol NONE DETECTED  NONE DETECTED   Barbiturates NONE DETECTED  NONE DETECTED  GLUCOSE, CAPILLARY      Result Value Range   Glucose-Capillary 96  70 - 99 mg/dL   Comment 1 Documented in Chart     Comment 2 Notify RN    POCT PREGNANCY, URINE      Result Value Range   Preg Test, Ur NEGATIVE  NEGATIVE   IV fluids.  Clonidine detox protocol initiated.  ACT consult requested  MDM  Polysubstance/heroin abuse requesting help with detox  Labs, UA, UDS reviewed as above  Medications provided  Vital signs, nursing notes reviewed  Sunnie Nielsen, MD 09/10/12 [phone]

## 2012-09-10 NOTE — ED Notes (Signed)
Patient will be transported to [facility].

## 2012-09-10 NOTE — ED Notes (Signed)
This RN removed supplemental O2, in preparation to move Pt to Psych ED.  O2 sat remaining at 95% RA.  Also, had Pt stand and take a couple steps to assess gait.  Pt sts she is "very sleepy," but was able to stand and take 2 steps w/o issue.

## 2012-09-10 NOTE — ED Notes (Signed)
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

## 2012-09-10 NOTE — ED Notes (Signed)
Correction pupils are pinpoint

## 2012-09-10 NOTE — ED Notes (Signed)
Pt states she is a "heroin addict" and gets sick every day and is ready for help. Pt requesting placement in a detox facility. Pt denies SI/HI at this time. Pt is in obvious distress.

## 2012-09-10 NOTE — ED Notes (Signed)
Pt placed on oxygen 2L [place],  O2 had dropped to 86 % room air and hr 42,  Started NS  Wide open

## 2012-09-10 NOTE — ED Notes (Signed)
ACT at bedside 

## 2012-09-10 NOTE — ED Notes (Signed)
Dr Manus Gunning came to see pt, told him pt is moving and jerking a lot, sweating and still complaining of body aches even though given Robaxin and Clonidine cannot be given due to BP 99/55 and this is her first detox from heroine. EDP said he'd put some new orders in for pt.

## 2012-09-11 MED ORDER — TRAZODONE HCL 50 MG PO TABS
50.0000 mg | ORAL_TABLET | Freq: Once | ORAL | Status: AC
  Administered 2012-09-11: 50 mg via ORAL
  Filled 2012-09-11: qty 1

## 2012-09-11 NOTE — BH Assessment (Signed)
[facility] Assessment Progress Note   Pt was evaluated again by Derwood Kaplan who did rounds with Dr. Lolly Mustache and ACT was informed pt will remain in psych ED to detox and be referred to [facility] or [facility] in the AM when they take those referrals.  See Shavon Rankin note

## 2012-09-11 NOTE — ED Notes (Signed)
Up to the bathroom 

## 2012-09-11 NOTE — ED Notes (Signed)
Sandwich/soda given 

## 2012-09-11 NOTE — ED Notes (Signed)
Dr wentz into see 

## 2012-09-11 NOTE — ED Notes (Signed)
Dr Arfeen andShavon NP into see 

## 2012-09-12 DIAGNOSIS — F1110 Opioid abuse, uncomplicated: Secondary | ICD-10-CM

## 2012-09-12 NOTE — Progress Notes (Signed)
Patient ID: Kathy Burns, female   DOB: 1985-05-17, 27 y.o.   MRN: [id number] Consult with and face to face interview with Dr Lolly Mustache this am.  Patient is here for Opioid detoxification and she is medicated with Clonidine per protocol.   She still reports hot/cold symptoms but denies SI/HI/AVH.  She is calm and cooperative and her V/S are within normal range.  She is asking for long treatment period at a facility that can assist her finish  Her detox.  We will continue to seek placement at [facility] or [facility].  She is tolerating po intakes. Dahlia Byes  PMHNP-BC  I have personally seen the patient and agreed with the findings and involved in the treatment plan. Kathryne Sharper, MD

## 2012-09-12 NOTE — Progress Notes (Signed)
Patient for possible discharge to [facility].  Patient is self pay patient and will need medications to enter [facility].  EDCM discussed medications with  [facility] NP Josephine.  As per NP, medications that patient is currently on are all for withdraw including the catapres.

## 2012-09-12 NOTE — ED Provider Notes (Signed)
Filed Vitals:   09/12/12 0500  BP: 109/68  Pulse: 83  Temp:   Resp: 17   Pt seen and assessed. NAD. Feels "better" but still symptomatic. Here for heroin detox. No other acute medical/psychiatric issues. Will attempt placement again today. If cannot place by afternoon, will DC with outpt resources.   Raeford Razor, MD 09/12/12 0930

## 2012-09-12 NOTE — BHH Counselor (Signed)
Pt has completed detox here in the ER 09/11/12. Dr. Lolly Mustache recommends residential treatment.  Pt referred to [facility] for residential treatment. No beds are available today, per [facility].  Pt referred to [facility] but declined due to  suicidal thoughts. However, patient today denies SI. ACT re-faxed consult note to see if [facility] will consider patient for a bed. Referral refaxed along with the evaluating provider Josephine's note from today stating patient is no longer suicidal. Patient was declined again due to SI.  Writer consulted with Julieanne Cotton. She recommended that pt stays here in the ED another night and have the morning providers (Dr. Claudius Sis, NP) decide what to do.

## 2012-09-12 NOTE — Progress Notes (Signed)
[facility] has seen patient and provided her with a oc application. °

## 2012-09-13 MED ORDER — LOPERAMIDE HCL 2 MG PO CAPS: 4.0000 mg | ORAL_CAPSULE | Freq: Once | ORAL | Status: DC

## 2012-09-13 MED ORDER — WHITE PETROLATUM GEL
Status: DC | PRN
  Filled 2012-09-13: qty 5

## 2012-09-13 NOTE — BHH Counselor (Signed)
Pt accepted to to [facility], per [facility]. Pt's bed will be available 7/15. The driver will be here to pick patient up at 10am 7/15.

## 2012-09-13 NOTE — ED Notes (Signed)
Patient requesting to go home so she can make court dates.  She has capacity to make decisions, denies HI or SI.   She is comfortable on exam and will follow up for drug rehab outpatient. Labs reviewed. DC Kathy Burns 9:25 PM   Kathy Skeens, MD 09/13/12 2125

## 2012-09-13 NOTE — ED Notes (Signed)
Pt was scheduled to go to [facility] tomorrow.  However, since she has court on Monday and Tuesday of next week, she is concerned that she will be taken to jail if she misses court.  This has caused pt to decline her admission to [facility].  EDP spoke with pt.  Pt denies SI/HI/AVH.

## 2012-09-13 NOTE — ED Notes (Signed)
Patient is awake and alert, denies HI or SI, interacts well with staff and others. Pt is medication complaint. Pt reports excesive shaking this morning. Denies N/V or SOB or headache. Pt recv scheduled mediation. 125/73 HR: 65 RR: 18 Sat 99% will continue to monitor for safety. Support and encouragment offerdQ 15 mins checks and hourly rounding

## 2012-09-13 NOTE — Progress Notes (Signed)
Kathy Burns [birth year] [id number] [facility]  Face to face interview and consult with Dr. Lolly Mustache.  [name].  [name]. Patient denies psychosis and paranoia.  Will continue to monitor patient.  [name].  Will continue with current plan to find placement for patient in long term rehab program  Kathy Bessler B. Sanaii Caporaso FNP-BC Family Nurse Practitioner, Board Certified 09/13/2012

## 2013-02-13 ENCOUNTER — Emergency Department (HOSPITAL_COMMUNITY)
Admission: EM | Admit: 2013-02-13 | Discharge: 2013-02-13 | Disposition: A | Discharge status: Home / Self Care | Payer: Self-pay | Attending: Emergency Medicine | Admitting: Emergency Medicine

## 2013-02-13 ENCOUNTER — Encounter (HOSPITAL_COMMUNITY): Payer: Self-pay | Admitting: Emergency Medicine

## 2013-02-13 DIAGNOSIS — Y9301 Activity, walking, marching and hiking: Secondary | ICD-10-CM | POA: Insufficient documentation

## 2013-02-13 DIAGNOSIS — Z8659 Personal history of other mental and behavioral disorders: Secondary | ICD-10-CM | POA: Insufficient documentation

## 2013-02-13 DIAGNOSIS — S0180XA Unspecified open wound of other part of head, initial encounter: Secondary | ICD-10-CM | POA: Insufficient documentation

## 2013-02-13 DIAGNOSIS — Y929 Unspecified place or not applicable: Secondary | ICD-10-CM | POA: Insufficient documentation

## 2013-02-13 DIAGNOSIS — F17200 Nicotine dependence, unspecified, uncomplicated: Secondary | ICD-10-CM | POA: Insufficient documentation

## 2013-02-13 DIAGNOSIS — W1809XA Striking against other object with subsequent fall, initial encounter: Secondary | ICD-10-CM | POA: Insufficient documentation

## 2013-02-13 DIAGNOSIS — S01112A Laceration without foreign body of left eyelid and periocular area, initial encounter: Secondary | ICD-10-CM

## 2013-02-13 MED ORDER — LIDOCAINE-EPINEPHRINE-TETRACAINE (LET) SOLUTION
3.0000 mL | Freq: Once | NASAL | Status: AC
  Administered 2013-02-13: 3 mL via TOPICAL
  Filled 2013-02-13: qty 3

## 2013-02-13 NOTE — ED Provider Notes (Signed)
CSN: [id number]     Arrival date & time 02/13/13  0711 History   First MD Initiated Contact with Patient 02/13/13 [phone]     Chief Complaint  Patient presents with  . Facial Laceration   (Consider location/radiation/quality/duration/timing/severity/associated sxs/prior Treatment) HPI Patient reports about an hour ago she got up to go to the bathroom and she fell. She does not know why she fell except she was walking in the dark. She states her head hit the wall. She denies any loss of consciousness. She states she has a laceration in her left eyebrow from the fall. She denies any other injury. She states she felt dizzy initially however that has resolved. She denies any nausea, visual changes, numbness or tingling in her extremities, neck pain. Patient reports her last tetanus was less than 10 years ago.   PCP Dr Jeannetta Nap  Past Medical History  Diagnosis Date  . Bipolar 1 disorder    History reviewed. No pertinent past surgical history. No family history on file. History  Substance Use Topics  . Smoking status: Current Every Day Smoker    Types: Cigarettes  . Smokeless tobacco: Not on file  . Alcohol Use: No   Smokes 2-3 ppd unemployed  OB History   Grav Para Term Preterm Abortions TAB SAB Ect Mult Living                 Review of Systems  All other systems reviewed and are negative.    Allergies  Broccoli and Codeine  Home Medications  No current outpatient prescriptions on file.   BP 117/64  Pulse 88  Temp(Src) 98.8 F (37.1 C)  Resp 20  SpO2 100%  LMP 02/13/2013  Vital signs normal   Physical Exam  Nursing note and vitals reviewed. Constitutional: She is oriented to person, place, and time. She appears well-developed and well-nourished.  Non-toxic appearance. She does not appear ill. No distress.  HENT:  Head: Normocephalic.  Right Ear: External ear normal.  Left Ear: External ear normal.  Nose: Nose normal. No mucosal edema or rhinorrhea.    Mouth/Throat: Oropharynx is clear and moist and mucous membranes are normal. No dental abscesses or uvula swelling.  Eyes: Conjunctivae and EOM are normal. Pupils are equal, round, and reactive to light.    Pt has mild swelling and bruising of her left upper eyelid/eyebrow with 3 mm linear vertically placed laceration in the inferior eyebrow. Mild tenderness of the superior orbital rim. Nontender other facial bones.   Neck: Normal range of motion and full passive range of motion without pain. Neck supple.  Pulmonary/Chest: Effort normal and breath sounds normal. No respiratory distress. She has no rhonchi. She exhibits no crepitus.  Abdominal: Soft. Normal appearance and bowel sounds are normal.  Musculoskeletal: Normal range of motion.  Moves all extremities well.   Neurological: She is alert and oriented to person, place, and time. She has normal strength. No cranial nerve deficit.  Skin: Skin is warm, dry and intact. No rash noted. No erythema. No pallor.  Psychiatric: She has a normal mood and affect. Her speech is normal and behavior is normal. Her mood appears not anxious.    ED Course  Procedures (including critical care time)  Medications  lidocaine-EPINEPHrine-tetracaine (LET) solution (3 mLs Topical Given 02/13/13 0741)   Pt requesting local injection, didn't feel LET had her adequately numb. We discussed bruising and probably black eye in a few days. Pt has no worrisome signs or symptoms to suggest acute intracranial/cervical  injury.    LACERATION REPAIR Performed by: Ward Givens Authorized by: Ward Givens Consent: Verbal consent obtained. Risks and benefits: risks, benefits and alternatives were discussed Consent given by: patient Patient identity confirmed: provided demographic data Prepped and Draped in normal sterile fashion Wound explored  Laceration Location: left lower mid eyebrow  Laceration Length: 0.3 cm  No Foreign Bodies seen or palpated  Anesthesia:  local infiltration and LET  Local anesthetic: lidocaine 2% + 1 epinephrine  Anesthetic total: 2 ml   Amount of cleaning: standard  Skin closure: 6-0 nylon  Number of sutures: 2  Technique: simple interrupted  Patient tolerance: Patient tolerated the procedure well with no immediate complications.    MDM   1. Eyebrow laceration, left, initial encounter     Plan discharge  Devoria Albe, MD, Franz Dell, MD 02/13/13 [phone]

## 2013-02-13 NOTE — ED Notes (Signed)
Pt sts "I got up to go to the bathroom and don't really remember falling."  Small laceration noted to L eyebrow.  Bleeding controlled.

## 2013-02-13 NOTE — ED Notes (Signed)
Pt states sleep walking; fell and hit head; small laceration above left eye; denies any other pain

## 2013-02-13 NOTE — ED Notes (Signed)
Pt escorted to discharge window. Verbalized understanding discharge instructions. In no acute distress.   

## 2013-02-14 ENCOUNTER — Encounter (HOSPITAL_COMMUNITY): Payer: Self-pay | Admitting: Emergency Medicine

## 2013-02-14 ENCOUNTER — Inpatient Hospital Stay (HOSPITAL_COMMUNITY)
Admission: EM | Admit: 2013-02-14 | Discharge: 2013-02-16 | DRG: 603 | Disposition: A | Discharge status: Home / Self Care | Payer: Self-pay | Attending: Internal Medicine | Admitting: Internal Medicine

## 2013-02-14 DIAGNOSIS — D72829 Elevated white blood cell count, unspecified: Secondary | ICD-10-CM | POA: Diagnosis present

## 2013-02-14 DIAGNOSIS — F17200 Nicotine dependence, unspecified, uncomplicated: Secondary | ICD-10-CM | POA: Diagnosis present

## 2013-02-14 DIAGNOSIS — F1910 Other psychoactive substance abuse, uncomplicated: Secondary | ICD-10-CM | POA: Diagnosis present

## 2013-02-14 DIAGNOSIS — Z8249 Family history of ischemic heart disease and other diseases of the circulatory system: Secondary | ICD-10-CM

## 2013-02-14 DIAGNOSIS — F1110 Opioid abuse, uncomplicated: Secondary | ICD-10-CM | POA: Diagnosis present

## 2013-02-14 DIAGNOSIS — Z79899 Other long term (current) drug therapy: Secondary | ICD-10-CM

## 2013-02-14 DIAGNOSIS — Z833 Family history of diabetes mellitus: Secondary | ICD-10-CM

## 2013-02-14 DIAGNOSIS — F319 Bipolar disorder, unspecified: Secondary | ICD-10-CM | POA: Diagnosis present

## 2013-02-14 DIAGNOSIS — F1920 Other psychoactive substance dependence, uncomplicated: Secondary | ICD-10-CM | POA: Diagnosis present

## 2013-02-14 DIAGNOSIS — Z823 Family history of stroke: Secondary | ICD-10-CM

## 2013-02-14 DIAGNOSIS — L0390 Cellulitis, unspecified: Secondary | ICD-10-CM | POA: Diagnosis present

## 2013-02-14 DIAGNOSIS — L02419 Cutaneous abscess of limb, unspecified: Principal | ICD-10-CM | POA: Diagnosis present

## 2013-02-14 DIAGNOSIS — F1210 Cannabis abuse, uncomplicated: Secondary | ICD-10-CM | POA: Diagnosis present

## 2013-02-14 MED ORDER — CLINDAMYCIN PHOSPHATE 900 MG/50ML IV SOLN
900.0000 mg | Freq: Once | INTRAVENOUS | Status: AC
  Administered 2013-02-15: 900 mg via INTRAVENOUS
  Filled 2013-02-14: qty 50

## 2013-02-14 NOTE — ED Notes (Signed)
Pt states she has redness and pain in her right ankle  Pt states she either got bit by something or has an infection in it  Pt states she is an drug user and has shot up in her ankle lately  Pt has redness and swelling noted and it is hot to the touch

## 2013-02-15 ENCOUNTER — Inpatient Hospital Stay (HOSPITAL_COMMUNITY): Payer: Self-pay

## 2013-02-15 ENCOUNTER — Encounter (HOSPITAL_COMMUNITY): Payer: Self-pay | Admitting: Internal Medicine

## 2013-02-15 DIAGNOSIS — L0291 Cutaneous abscess, unspecified: Secondary | ICD-10-CM

## 2013-02-15 DIAGNOSIS — L0390 Cellulitis, unspecified: Secondary | ICD-10-CM | POA: Diagnosis present

## 2013-02-15 DIAGNOSIS — F1910 Other psychoactive substance abuse, uncomplicated: Secondary | ICD-10-CM | POA: Diagnosis present

## 2013-02-15 LAB — COMPREHENSIVE METABOLIC PANEL
ALT: 32 U/L (ref 0–35)
AST: 26 U/L (ref 0–37)
Alkaline Phosphatase: 111 U/L (ref 39–117)
CO2: 27 mEq/L (ref 19–32)
Calcium: 9.3 mg/dL (ref 8.4–10.5)
Chloride: 100 mEq/L (ref 96–112)
GFR calc Af Amer: >90 mL/min (ref >90)
GFR calc non Af Amer: >90 mL/min (ref >90)
Glucose, Bld: 101 mg/dL — ABNORMAL HIGH (ref 70–99)
Sodium: 135 mEq/L (ref 135–145)
Total Bilirubin: 0.7 mg/dL (ref 0.3–1.2)

## 2013-02-15 LAB — CG4 I-STAT (LACTIC ACID): Lactic Acid, Venous: 1.42 mmol/L (ref 0.5–2.2)

## 2013-02-15 LAB — CBC WITH DIFFERENTIAL/PLATELET
Basophils Absolute: 0 10*3/uL (ref 0.0–0.1)
Basophils Relative: 0 % (ref 0–1)
Eosinophils Absolute: 0.4 10*3/uL (ref 0.0–0.7)
Eosinophils Relative: 3 % (ref 0–5)
Eosinophils Relative: 4 % (ref 0–5)
HCT: 36.5 % (ref 36.0–46.0)
Hemoglobin: 12.3 g/dL (ref 12.0–15.0)
Hemoglobin: 13 g/dL (ref 12.0–15.0)
Lymphocytes Relative: 15 % (ref 12–46)
Lymphs Abs: 2.1 10*3/uL (ref 0.7–4.0)
MCH: 27.5 pg (ref 26.0–34.0)
MCH: 27.5 pg (ref 26.0–34.0)
MCHC: 33.2 g/dL (ref 30.0–36.0)
MCHC: 33.7 g/dL (ref 30.0–36.0)
MCV: 81.7 fL (ref 78.0–100.0)
MCV: 82.7 fL (ref 78.0–100.0)
Monocytes Absolute: 1.1 10*3/uL — ABNORMAL HIGH (ref 0.1–1.0)
Monocytes Relative: 8 % (ref 3–12)
Neutro Abs: 10 10*3/uL — ABNORMAL HIGH (ref 1.7–7.7)
Neutro Abs: 8.4 10*3/uL — ABNORMAL HIGH (ref 1.7–7.7)
Neutrophils Relative %: 71 % (ref 43–77)
Neutrophils Relative %: 74 % (ref 43–77)
Platelets: 227 10*3/uL (ref 150–400)
Platelets: 257 10*3/uL (ref 150–400)
RBC: 4.47 MIL/uL (ref 3.87–5.11)
RDW: 14.5 % (ref 11.5–15.5)
RDW: 14.5 % (ref 11.5–15.5)
WBC: 13.5 10*3/uL — ABNORMAL HIGH (ref 4.0–10.5)

## 2013-02-15 LAB — BASIC METABOLIC PANEL
BUN: 13 mg/dL (ref 6–23)
CO2: 25 mEq/L (ref 19–32)
Calcium: 9.1 mg/dL (ref 8.4–10.5)
Chloride: 100 mEq/L (ref 96–112)
Creatinine, Ser: 0.71 mg/dL (ref 0.50–1.10)
GFR calc Af Amer: >90 mL/min (ref >90)
GFR calc non Af Amer: >90 mL/min (ref >90)
Glucose, Bld: 125 mg/dL — ABNORMAL HIGH (ref 70–99)
Potassium: 3.4 mEq/L — ABNORMAL LOW (ref 3.5–5.1)
Sodium: 136 mEq/L (ref 135–145)

## 2013-02-15 LAB — RAPID URINE DRUG SCREEN, HOSP PERFORMED
Amphetamines: NOT DETECTED
Tetrahydrocannabinol: POSITIVE — AB

## 2013-02-15 LAB — HIV ANTIBODY (ROUTINE TESTING W REFLEX): HIV: NONREACTIVE

## 2013-02-15 LAB — SEDIMENTATION RATE: Sed Rate: 25 mm/hr — ABNORMAL HIGH (ref 0–22)

## 2013-02-15 MED ORDER — DICYCLOMINE HCL 20 MG PO TABS
20.0000 mg | ORAL_TABLET | Freq: Four times a day (QID) | ORAL | Status: DC | PRN
  Filled 2013-02-15: qty 1

## 2013-02-15 MED ORDER — TETANUS-DIPHTH-ACELL PERTUSSIS 5-2.5-18.5 LF-MCG/0.5 IM SUSP
0.5000 mL | Freq: Once | INTRAMUSCULAR | Status: AC
  Administered 2013-02-15: 0.5 mL via INTRAMUSCULAR
  Filled 2013-02-15: qty 0.5

## 2013-02-15 MED ORDER — ENOXAPARIN SODIUM 30 MG/0.3ML ~~LOC~~ SOLN: 30.0000 mg | SUBCUTANEOUS | Status: DC

## 2013-02-15 MED ORDER — SODIUM CHLORIDE 0.9 % IV SOLN
INTRAVENOUS | Status: DC
  Administered 2013-02-15: 09:00:00 via INTRAVENOUS

## 2013-02-15 MED ORDER — SODIUM CHLORIDE 0.9 % IV SOLN: INTRAVENOUS | Status: DC

## 2013-02-15 MED ORDER — ONDANSETRON 4 MG PO TBDP
4.0000 mg | ORAL_TABLET | Freq: Four times a day (QID) | ORAL | Status: DC | PRN
  Filled 2013-02-15: qty 1

## 2013-02-15 MED ORDER — CLONIDINE HCL 0.1 MG PO TABS: 0.1000 mg | ORAL_TABLET | Freq: Every day | ORAL | Status: DC

## 2013-02-15 MED ORDER — ACETAMINOPHEN 325 MG PO TABS: 650.0000 mg | ORAL_TABLET | Freq: Four times a day (QID) | ORAL | Status: DC | PRN

## 2013-02-15 MED ORDER — CLONIDINE HCL 0.1 MG PO TABS: 0.1000 mg | ORAL_TABLET | ORAL | Status: DC

## 2013-02-15 MED ORDER — ONDANSETRON HCL 4 MG PO TABS
4.0000 mg | ORAL_TABLET | Freq: Four times a day (QID) | ORAL | Status: DC | PRN
  Administered 2013-02-16: 01:00:00 4 mg via ORAL
  Filled 2013-02-15: qty 1

## 2013-02-15 MED ORDER — ONDANSETRON HCL 4 MG/2ML IJ SOLN: 4.0000 mg | Freq: Four times a day (QID) | INTRAMUSCULAR | Status: DC | PRN

## 2013-02-15 MED ORDER — NAPROXEN 500 MG PO TABS
500.0000 mg | ORAL_TABLET | Freq: Two times a day (BID) | ORAL | Status: DC | PRN
  Filled 2013-02-15: qty 1

## 2013-02-15 MED ORDER — ENOXAPARIN SODIUM 40 MG/0.4ML ~~LOC~~ SOLN
40.0000 mg | SUBCUTANEOUS | Status: DC
  Administered 2013-02-15: 40 mg via SUBCUTANEOUS
  Filled 2013-02-15 (×2): qty 0.4

## 2013-02-15 MED ORDER — ACETAMINOPHEN 650 MG RE SUPP: 650.0000 mg | Freq: Four times a day (QID) | RECTAL | Status: DC | PRN

## 2013-02-15 MED ORDER — CLONIDINE HCL 0.1 MG PO TABS
0.1000 mg | ORAL_TABLET | Freq: Four times a day (QID) | ORAL | Status: DC
  Administered 2013-02-15 – 2013-02-16 (×6): 0.1 mg via ORAL
  Filled 2013-02-15 (×8): qty 1

## 2013-02-15 MED ORDER — LOPERAMIDE HCL 2 MG PO CAPS: 2.0000 mg | ORAL_CAPSULE | ORAL | Status: DC | PRN

## 2013-02-15 MED ORDER — CLINDAMYCIN HCL 150 MG PO CAPS: 300.0000 mg | ORAL_CAPSULE | Freq: Four times a day (QID) | ORAL | Status: DC

## 2013-02-15 MED ORDER — ZOLPIDEM TARTRATE 5 MG PO TABS
5.0000 mg | ORAL_TABLET | Freq: Once | ORAL | Status: AC
  Administered 2013-02-15: 22:00:00 5 mg via ORAL
  Filled 2013-02-15: qty 1

## 2013-02-15 MED ORDER — HYDROXYZINE HCL 25 MG PO TABS
25.0000 mg | ORAL_TABLET | Freq: Four times a day (QID) | ORAL | Status: DC | PRN
  Administered 2013-02-16: 25 mg via ORAL
  Filled 2013-02-15: qty 1

## 2013-02-15 MED ORDER — METHOCARBAMOL 500 MG PO TABS: 500.0000 mg | ORAL_TABLET | Freq: Three times a day (TID) | ORAL | Status: DC | PRN

## 2013-02-15 MED ORDER — VANCOMYCIN HCL 10 G IV SOLR
1500.0000 mg | Freq: Two times a day (BID) | INTRAVENOUS | Status: DC
  Administered 2013-02-15 – 2013-02-16 (×3): 1500 mg via INTRAVENOUS
  Filled 2013-02-15 (×4): qty 1500

## 2013-02-15 NOTE — ED Notes (Signed)
Patient belongings placed into 2 belongings bags labels affixed secured in locker 32 located in [place]. Patient valuables envelope [phone] given to security.

## 2013-02-15 NOTE — H&P (Addendum)
[facility] History and Physical  Kathy Burns NGE:[id number] DOB: [birth year] DOA: 02/14/2013  Referring physician: ER physician. PCP: No primary provider on file.  Chief Complaint: Right ankle swelling.  HPI: Kathy Burns is a 27 y.o. female history of IV drug abuse started noticing swelling in her right ankle after she injected IV drugs on to that ankle 2 days ago. The swelling has been gradually worsening. In the ER Dr. Norlene Campbell noticed that the swelling has worsened quickly. Patient to start IV antibiotics after blood cultures were sent. Patient has been admitted for IV antibiotics for cellulitis of the right ankle probably secondary to IV drug injection at the site. X-rays are pending of the right ankle. Addition patient has been having chills and sweats probably from drug withdrawal and patient has been placed on clonidine withdrawal protocol. Patient otherwise denies any chest pain shortness of breath nausea vomiting abdominal pain or diarrhea. Patient had come yesterday for a fall and had sustained left eyebrow laceration which was sutured.   Review of Systems: As presented in the history of presenting illness, rest negative.  Past Medical History  Diagnosis Date  . Bipolar 1 disorder    Past Surgical History  Procedure Laterality Date  . Tmj arthroplasty    . C section x 2      Social History:  reports that she has been smoking Cigarettes.  She has been smoking about 0.00 packs per day. She does not have any smokeless tobacco history on file. She reports that she uses illicit drugs (Marijuana and Cocaine). She reports that she does not drink alcohol. Where does patient live home. Can patient participate in ADLs? Yes.  Allergies  Allergen Reactions  . Broccoli [Brassica Oleracea Italica] Anaphylaxis, Itching and Swelling    Only raw broccoli causes reaction:  . Codeine Itching    Family History:  Family History  Problem Relation Age of Onset  . Hypertension Other    . Diabetes Other   . Cancer Other   . Stroke Other       Prior to Admission medications   Medication Sig Start Date End Date Taking? Authorizing Provider  clindamycin (CLEOCIN) 150 MG capsule Take 2 capsules (300 mg total) by mouth every 6 (six) hours. 02/15/13   Olivia Mackie, MD    Physical Exam: Filed Vitals:   02/14/13 2241 02/15/13 0322  BP: 106/59 98/71  Pulse: 105 85  Temp: 98.4 F (36.9 C)   TempSrc: Oral   Resp: 20 16  Height: 5' 5.5" (1.664 m)   Weight: 73.143 kg (161 lb 4 oz)   SpO2: 100% 97%     General:  Well-developed and nourished.  Eyes: Anicteric no pallor. Suture on the left eyebrow.  ENT: No discharge from the assessments amount.  Neck: No mass felt.  Cardiovascular: S1-S2 heard.  Respiratory: No rhonchi or crepitations.  Abdomen: Soft nontender bowel sounds present.  Skin: Swelling of the right ankle with mild erythema. Swelling is mild extending to the distal leg.  Musculoskeletal: As explained in the skin section. Good pulses.  Psychiatric: Appears normal.  Neurologic: Alert awake oriented to time place and person.  Labs on Admission:  Basic Metabolic Panel:  Recent Labs Lab 02/15/13 0005  NA 136  K 3.4*  CL 100  CO2 25  GLUCOSE 125*  BUN 13  CREATININE 0.71  CALCIUM 9.1   Liver Function Tests: No results found for this basename: AST, ALT, ALKPHOS, BILITOT, PROT, ALBUMIN,  in the last  168 hours No results found for this basename: LIPASE, AMYLASE,  in the last 168 hours No results found for this basename: AMMONIA,  in the last 168 hours CBC:  Recent Labs Lab 02/15/13 0005  WBC 13.5*  NEUTROABS 10.0*  HGB 12.3  HCT 36.5  MCV 81.7  PLT 227   Cardiac Enzymes: No results found for this basename: CKTOTAL, CKMB, CKMBINDEX, TROPONINI,  in the last 168 hours  BNP (last 3 results) No results found for this basename: PROBNP,  in the last 8760 hours CBG: No results found for this basename: GLUCAP,  in the last 168  hours  Radiological Exams on Admission: No results found.   Assessment/Plan Principal Problem:   Cellulitis Active Problems:   Polysubstance abuse   1. Right leg cellulitis - probably secondary to IV drug administration at the site. Patient was initially started clindamycin and blood cultures were ordered. I have placed patient on vancomycin. I have ordered x-rays of the right ankle to check for any osteomyelitis of that area and I have ordered to keep her right leg raised. I have ordered one dose of tetanus toxoid patient does not recall the exact date she had the previous one. And I will continue with IV fluids. Check HIV status. 2. Polysubstance abuse - patient states she uses IV Heroin. Patient is willing for detox and has been placed on clonidine withdrawal protocol. I have requested social work consult for polysubstance abuse counseling. 3. Leukocytosis - probably from #1 reason.    Code Status: Full code.  Family Communication: None.  Disposition Plan: Admit to inpatient.    Reana Chacko N. [facility] Pager [phone].  If 7PM-7AM, please contact night-coverage [email or web address] Password [facility] 02/15/2013, 4:33 AM

## 2013-02-15 NOTE — ED Notes (Signed)
Patient states she would like assistance with detox from heroin. Patient reports a recent stay at [facility] @ 30 days ago. 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

## 2013-02-15 NOTE — Progress Notes (Signed)
CSW consulted to see pt for assistance with substance abuse resources. Pt unable to keep her eyes open during visit. Pt said she was willing to see CSW later today when she was more awake. CSW will return this afternoon.  Cori Razor LCSW [phone]

## 2013-02-15 NOTE — Progress Notes (Addendum)
Pt seen and examined, with IVDA, Heroin use 2gm/day -Agree with note per dr.Kakrakandy -For cellulitis continue Vanc -FU Blood Cx -Psych consult  Zannie Cove, MD [phone]

## 2013-02-15 NOTE — ED Provider Notes (Addendum)
Medical screening examination/treatment/procedure(s) were conducted as a shared visit with non-physician practitioner(s) and myself.  I personally evaluated the patient during the encounter.  Pt is IVDU, reports injecting heroin into ankle 2-3 days ago.  Yesterday noticed red spot to ankle around area of injection.  Has had worsening swelling, redness and pain since that time.  Has h/o cellulitis in the past.  Concern given her IVDU and poor f/u for worsening condition.  Recommending admission for iv abx  Olivia Mackie, MD 02/15/13 0221  She was seen by hospitalist, and refused admission.  Patient is concerned because she is starting to withdraw from heroin and does not want to be "drugs.  It" in the hospital.  She is wanting detox, but feels that she would not be adequately dictated for her detox in the hospital.  AMA paperwork completed, outpatient antibiotic prescription written.  Patient counseled that given her cellulitis it would be best if she was admitted the hospital, but could return at any time.  Patient has changed her mind, and now is willing to stay in the hospital for IV antibiotics  Olivia Mackie, MD 02/15/13 [phone]

## 2013-02-15 NOTE — Progress Notes (Signed)
Utilization review completed.  

## 2013-02-15 NOTE — Plan of Care (Signed)
Problem: Phase I Progression Outcomes Goal: Pain controlled with appropriate interventions Outcome: Progressing Pt not having pain.  Goal: OOB as tolerated unless otherwise ordered Outcome: Progressing Pt ambulates with no difficulty. Ambulated in room/  Goal: Wound assessment- dressing change as appropriate Outcome: Progressing Wound assessed. No dressing changes.

## 2013-02-15 NOTE — Progress Notes (Signed)
ANTIBIOTIC CONSULT NOTE - INITIAL  Pharmacy Consult for Vancomycin  Indication: Cellulitis   Allergies  Allergen Reactions  . Broccoli [Brassica Oleracea Italica] Anaphylaxis, Itching and Swelling    Only raw broccoli causes reaction:  . Codeine Itching    Patient Measurements: Height: 5' 5.5" (166.4 cm) Weight: 161 lb 4 oz (73.143 kg) IBW/kg (Calculated) : 58.15   Vital Signs: Temp: 98.7 F (37.1 C) (12/17 0744) Temp src: Oral (12/17 0744) BP: 122/70 mmHg (12/17 0744) Pulse Rate: 77 (12/17 0744) Intake/Output from previous day:   Intake/Output from this shift:    Labs:  Recent Labs  02/15/13 0005  WBC 13.5*  HGB 12.3  PLT 227  CREATININE 0.71   Estimated Creatinine Clearance: 105.2 ml/min (by C-G formula based on Cr of 0.71). No results found for this basename: VANCOTROUGH, VANCOPEAK, VANCORANDOM, GENTTROUGH, GENTPEAK, GENTRANDOM, TOBRATROUGH, TOBRAPEAK, TOBRARND, AMIKACINPEAK, AMIKACINTROU, AMIKACIN,  in the last 72 hours   Microbiology: No results found for this or any previous visit (from the past 720 hour(s)).  Medical History: Past Medical History  Diagnosis Date  . Bipolar 1 disorder     Medications:  Scheduled:  . cloNIDine  0.1 mg Oral QID   Followed by  . [START ON 02/17/2013] cloNIDine  0.1 mg Oral BH-qamhs   Followed by  . [START ON 02/20/2013] cloNIDine  0.1 mg Oral QAC breakfast   Infusions:  . sodium chloride     PRN: acetaminophen, acetaminophen, dicyclomine, hydrOXYzine, loperamide, methocarbamol, naproxen, ondansetron (ZOFRAN) IV, ondansetron, ondansetron Assessment:  27 yo F with cellulitis of R ankle likely secondary to IV drug injections at this site.   Initially started on clindamycin 900 mg x 1, now changing to Vancomycin per pharmacy.    Ankle X-ray from today was negative for OM, will aim for vancomycin trough to target cellulitis.  Scr is WNL 0.71, with estimated CrCl > 100 ml/min   WBC elevated 13.5  Afebrile  Blood  cultures x 2 sent on 12/17  Goal of Therapy:  Vancomycin trough level 10-15 mcg/ml  Plan:  1.) Vancomycin 1500 mg IV q12h  2.) Monitor renal function  3.) Check vancomycin troughs as needed 4.) f/u cultures and clinical course   Nuala Chiles, Loma Messing PharmD Pager #: [phone] 8:18 AM 02/15/2013

## 2013-02-15 NOTE — ED Provider Notes (Signed)
CSN: [id number]     Arrival date & time 02/14/13  2240 History   First MD Initiated Contact with Patient 02/14/13 2254     Chief Complaint  Patient presents with  . Ankle Pain   (Consider location/radiation/quality/duration/timing/severity/associated sxs/prior Treatment) HPI  Patient presents to the emergency department with cellulitis to the medial ankle.  Patient, states she is an IV drug user and injected, in this area 3 days, ago.  She states that the area of redness is more severe over that time.  The patient, states she's had cellulitis in the past.  Patient denies nausea, vomiting, headache, blurred some back pain, dysuria, rash, weakness, numbness, dizziness, back pain, abdominal pain, chest pain, or shortness of breath.  Patient, states she didn't take any medications prior to arrival.  She states that palpation makes the pain, worse. Past Medical History  Diagnosis Date  . Bipolar 1 disorder    Past Surgical History  Procedure Laterality Date  . Tmj arthroplasty    . C section x 2      Family History  Problem Relation Age of Onset  . Hypertension Other   . Diabetes Other   . Cancer Other   . Stroke Other    History  Substance Use Topics  . Smoking status: Current Every Day Smoker    Types: Cigarettes  . Smokeless tobacco: Not on file  . Alcohol Use: No   OB History   Grav Para Term Preterm Abortions TAB SAB Ect Mult Living                 Review of Systems All other systems negative except as documented in the HPI. All pertinent positives and negatives as reviewed in the HPI. Allergies  Broccoli and Codeine  Home Medications  No current outpatient prescriptions on file. BP 106/59  Pulse 105  Temp(Src) 98.4 F (36.9 C) (Oral)  Resp 20  Ht 5' 5.5" (1.664 m)  Wt 161 lb 4 oz (73.143 kg)  BMI 26.42 kg/m2  SpO2 100%  LMP 02/13/2013 Physical Exam  Constitutional: She is oriented to person, place, and time. She appears well-developed and well-nourished. No  distress.  HENT:  Head: Normocephalic and atraumatic.  Cardiovascular: Normal rate, regular rhythm and normal heart sounds.   Pulmonary/Chest: Effort normal and breath sounds normal.  Musculoskeletal:       Right ankle: She exhibits swelling. She exhibits normal range of motion, no ecchymosis, no deformity, no laceration and normal pulse.       Feet:  Neurological: She is alert and oriented to person, place, and time.  Skin: Skin is warm and dry.    ED Course  Procedures (including critical care time) Labs Review Labs Reviewed  CBC WITH DIFFERENTIAL - Abnormal; Notable for the following:    WBC 13.5 (*)    Neutro Abs 10.0 (*)    Monocytes Absolute 1.1 (*)    All other components within normal limits  BASIC METABOLIC PANEL - Abnormal; Notable for the following:    Potassium 3.4 (*)    Glucose, Bld 125 (*)    All other components within normal limits  CULTURE, BLOOD (ROUTINE X 2)  CULTURE, BLOOD (ROUTINE X 2)  SEDIMENTATION RATE  CG4 I-STAT (LACTIC ACID)    MDM   1. Cellulitis    Patient started on IV clindamycin.  I spoke with the, [facility] who will admit the patient to the hospital.  Patient, has been stable here in the emergency department  Carlyle Dolly, [name] 02/15/13 0127

## 2013-02-15 NOTE — ED Notes (Signed)
Patient transported to XR. 

## 2013-02-15 NOTE — ED Notes (Signed)
I stat CG4 given to Dr. Wilkie Aye.

## 2013-02-16 DIAGNOSIS — F1110 Opioid abuse, uncomplicated: Secondary | ICD-10-CM | POA: Diagnosis present

## 2013-02-16 DIAGNOSIS — F3160 Bipolar disorder, current episode mixed, unspecified: Secondary | ICD-10-CM

## 2013-02-16 HISTORY — DX: Opioid abuse, uncomplicated: F11.10

## 2013-02-16 LAB — BASIC METABOLIC PANEL
BUN: 12 mg/dL (ref 6–23)
CO2: 23 mEq/L (ref 19–32)
Chloride: 105 mEq/L (ref 96–112)
Glucose, Bld: 130 mg/dL — ABNORMAL HIGH (ref 70–99)
Potassium: 3.5 mEq/L (ref 3.5–5.1)
Sodium: 138 mEq/L (ref 135–145)

## 2013-02-16 LAB — CBC
Platelets: 277 10*3/uL (ref 150–400)
RBC: 4.79 MIL/uL (ref 3.87–5.11)
WBC: 11.1 10*3/uL — ABNORMAL HIGH (ref 4.0–10.5)

## 2013-02-16 MED ORDER — DOXYCYCLINE HYCLATE 100 MG PO TABS: 100.0000 mg | ORAL_TABLET | Freq: Two times a day (BID) | ORAL | Status: DC

## 2013-02-16 MED ORDER — VANCOMYCIN HCL 10 G IV SOLR
1500.0000 mg | Freq: Two times a day (BID) | INTRAVENOUS | Status: DC
  Filled 2013-02-16: qty 1500

## 2013-02-16 MED ORDER — CLONIDINE HCL 0.1 MG PO TABS: 0.1000 mg | ORAL_TABLET | Freq: Two times a day (BID) | ORAL | Status: DC

## 2013-02-16 NOTE — Progress Notes (Signed)
Pharmacy: Brief note  RN called pharmacy to report only half IV vancomycin dose infused this morning secondary to lost IV access.  Will reschedule vancomycin dosing so next dose is given this afternoon.    Thanks,  Raeshawn Vo, Loma Messing PharmD Pager #: [phone] 1:30 PM 02/16/2013

## 2013-02-16 NOTE — Discharge Summary (Signed)
Physician Discharge Summary  [name]:[id number] DOB: August 29, 1985 DOA: 02/14/2013  PCP: No primary provider on file.  Admit date: 02/14/2013 Discharge date: 02/16/2013  Time spent: 50 minutes  Recommendations for Outpatient Follow-up:  1. PCP in 1 week 2. Drug rehab program per judge  Discharge Diagnoses:  Principal Problem:   Cellulitis Active Problems:   Polysubstance abuse   Heroin abuse   Discharge Condition: stable Diet recommendation: regular  Filed Weights   02/14/13 2241 02/15/13 0830  Weight: 73.143 kg (161 lb 4 oz) 73.029 kg (161 lb)    History of present illness:  Chief Complaint: Right ankle swelling.  HPI: Kathy Burns is a 27 y.o. female history of IV drug abuse started noticing swelling in her right ankle after she injected IV drugs on to that ankle 2 days ago. The swelling has been gradually worsening. In the ER Dr. Norlene Campbell noticed that the swelling has worsened quickly. Patient to start IV antibiotics after blood cultures were sent. Patient has been admitted for IV antibiotics for cellulitis of the right ankle probably secondary to IV drug injection at the site. X-rays are pending of the right ankle. Addition patient has been having chills and sweats probably from drug withdrawal and patient has been placed on clonidine withdrawal protocol. Patient otherwise denies any chest pain shortness of breath nausea vomiting abdominal pain or diarrhea. Patient had come yesterday for a fall and had sustained left eyebrow laceration which was sutured.   Hospital Course:  1. Right leg cellulitis  - improved with IV Vanc -transitioned to PO DOxy for 10days -blood Cx negative -Xrays without osteomyelitis -HIV negative  2. Polysubstance abuse - patient states she uses IV Heroin daily -started on clonidine detox protocol -did not have overt withdrawal in the hospital,  -was seen by Psychiatry and inpatient drug rehab was not recommended, -was given outpatient  resources and patient declines rehab at this time.  3. Leukocytosis - probably from #1 reason. -improving   Consultations:  PSychiatry  Discharge Exam: Filed Vitals:   02/16/13 1341  BP: 110/59  Pulse: 73  Temp: 98.6 F (37 C)  Resp: 16    General:AAOx3 Cardiovascular:S1S2/RRR Respiratory:CTAB  Discharge Instructions  Discharge Orders   Future Orders Complete By Expires   Diet general  As directed    Increase activity slowly  As directed        Medication List         cloNIDine 0.1 MG tablet  Commonly known as:  CATAPRES  Take 1 tablet (0.1 mg total) by mouth 2 (two) times daily. 1 tab twice a day for 2 days then 1tab daily for 2 days then STOP  Start taking on:  02/17/2013     doxycycline 100 MG tablet  Commonly known as:  VIBRA-TABS  Take 1 tablet (100 mg total) by mouth 2 (two) times daily. For 10days       Allergies  Allergen Reactions  . Broccoli [Brassica Oleracea Italica] Anaphylaxis, Itching and Swelling    Only raw broccoli causes reaction:  . Codeine Itching       Follow-up Information   Follow up with PCP. Schedule an appointment as soon as possible for a visit in 1 week.       The results of significant diagnostics from this hospitalization (including imaging, microbiology, ancillary and laboratory) are listed below for reference.    Significant Diagnostic Studies: Dg Ankle Complete Right  02/15/2013   CLINICAL DATA:  Check for osteomyelitis.  EXAM:  RIGHT ANKLE - COMPLETE 3+ VIEW  TECHNIQUE: Three views of the right ankle  COMPARISON:  None available for comparison at time of study interpretation.  FINDINGS: No acute fracture deformity or dislocation. Joint space intact without erosions. No destructive bony lesions. Medial ankle soft tissue swelling without subcutaneous gas, radiopaque foreign bodies.  IMPRESSION: Mild medial ankle soft tissue swelling without acute fracture deformity, dislocation nor radiographic findings of  osteomyelitis.   Electronically Signed   By: Awilda Metro   On: 02/15/2013 05:43    Microbiology: Recent Results (from the past 240 hour(s))  CULTURE, BLOOD (ROUTINE X 2)     Status: None   Collection Time    02/15/13 12:05 AM      Result Value Range Status   Specimen Description BLOOD LEFT HAND   Final   Special Requests BOTTLES DRAWN AEROBIC AND ANAEROBIC 3CC   Final   Culture  Setup Time     Final   Value: 02/15/2013 04:22     Performed at [facility]   Culture     Final   Value:        BLOOD CULTURE RECEIVED NO GROWTH TO DATE CULTURE WILL BE HELD FOR 5 DAYS BEFORE ISSUING A FINAL NEGATIVE REPORT     Performed at [facility]   Report Status PENDING   Incomplete  CULTURE, BLOOD (ROUTINE X 2)     Status: None   Collection Time    02/15/13 12:10 AM      Result Value Range Status   Specimen Description BLOOD RIGHT HAND   Final   Special Requests BOTTLES DRAWN AEROBIC AND ANAEROBIC 3CC   Final   Culture  Setup Time     Final   Value: 02/15/2013 04:22     Performed at [facility]   Culture     Final   Value:        BLOOD CULTURE RECEIVED NO GROWTH TO DATE CULTURE WILL BE HELD FOR 5 DAYS BEFORE ISSUING A FINAL NEGATIVE REPORT     Performed at [facility]   Report Status PENDING   Incomplete     Labs: Basic Metabolic Panel:  Recent Labs Lab 02/15/13 0005 02/15/13 0900 02/16/13 0519  NA 136 135 138  K 3.4* 4.1 3.5  CL 100 100 105  CO2 25 27 23   GLUCOSE 125* 101* 130*  BUN 13 12 12   CREATININE 0.71 0.74 0.68  CALCIUM 9.1 9.3 9.2   Liver Function Tests:  Recent Labs Lab 02/15/13 0900  AST 26  ALT 32  ALKPHOS 111  BILITOT 0.7  PROT 7.3  ALBUMIN 3.5   No results found for this basename: LIPASE, AMYLASE,  in the last 168 hours No results found for this basename: AMMONIA,  in the last 168 hours CBC:  Recent Labs Lab 02/15/13 0005 02/15/13 0900 02/16/13 0519  WBC 13.5* 11.8* 11.1*  NEUTROABS 10.0* 8.4*  --   HGB 12.3  13.0 12.7  HCT 36.5 39.1 39.2  MCV 81.7 82.7 81.8  PLT 227 257 277   Cardiac Enzymes: No results found for this basename: CKTOTAL, CKMB, CKMBINDEX, TROPONINI,  in the last 168 hours BNP: BNP (last 3 results) No results found for this basename: PROBNP,  in the last 8760 hours CBG: No results found for this basename: GLUCAP,  in the last 168 hours     Signed:  Birdie Fetty  [facility] 02/16/2013, 3:57 PM

## 2013-02-16 NOTE — Consult Note (Addendum)
[facility] Face-to-Face Psychiatry Consult   Reason for Consult:  Heroin use, bipolar disorder Referring Physician:  Dr Earl Gala is an 27 y.o. female.  Assessment: AXIS I:  Bipolar, mixed and Substance Abuse AXIS II:  Deferred AXIS III:   Past Medical History  Diagnosis Date  . Bipolar 1 disorder    AXIS IV:  economic problems, other psychosocial or environmental problems, problems related to social environment and problems with primary support group AXIS V:  41-50 serious symptoms  Plan:  Patient does not meet criteria for psychiatric inpatient admission. Supportive therapy provided about ongoing stressors. Discussed crisis plan, support from social network, calling 911, coming to the Emergency Department, and calling Suicide Hotline. Patient required rehabilitation   Subjective:   Kathy Burns is a 27 y.o. female patient admitted with right ankle swelling.  HPI:  Patient seen and chart reviewed.  Patient is 27 year old Caucasian female who has a long history of intravenous drug use and polysubstance dependence.  Patient was admitted to the right ankle swelling.  She's been injecting heroin and she got very scared when she noticed swelling.  Patient was in [facility] for detox and rehabilitation but did not finish the program and left [facility] after 5 days.  She did seem to heavy drug use since Thanksgiving.  Patient endorsed history of bipolar disorder however has not taken any medication for a long time.  Patient denies any suicidal attempt in recent years however has been seeing psychiatrist and given medication for bipolar disorder in the past.  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  She has 2 children who lives with grandmother.  Patient is concerned about her drug use.  This is her fifth detox treatment.  Patient is also on probation.  She has drug charges.  She runs a long-term rehabilitation.  She denies any hallucination, paranoia, suicidal thoughts or homicidal  thoughts.  She has a lot of physical symptoms due to withdrawal.  She has chills, tremors and shakes.  Her UDS is positive for cocaine, marijuana and opiates.  She denies any drinking.  She has limited support system. HPI Elements:   Location:  Medical floor. Quality:  poor. Severity:  Moderate.  Past Psychiatric History: Past Medical History  Diagnosis Date  . Bipolar 1 disorder     reports that she has been smoking Cigarettes.  She has been smoking about 0.00 packs per day. She does not have any smokeless tobacco history on file. She reports that she uses illicit drugs (Marijuana and Cocaine). She reports that she does not drink alcohol. Family History  Problem Relation Age of Onset  . Hypertension Other   . Diabetes Other   . Cancer Other   . Stroke Other      Living Arrangements: Alone;Other (Comment)   Abuse/Neglect [facility]) Physical Abuse: Denies Verbal Abuse: Denies Sexual Abuse: Denies Allergies:   Allergies  Allergen Reactions  . Broccoli [Brassica Oleracea Italica] Anaphylaxis, Itching and Swelling    Only raw broccoli causes reaction:  . Codeine Itching    ACT Assessment Complete:  No:   Past Psychiatric History: Diagnosis:  Bipolar disorder, polysubstance dependence, heroin use   Hospitalizations:  Yes   Outpatient Care:  No   Substance Abuse Care:  Patient has history of polysubstance   Self-Mutilation:  Denies   Suicidal Attempts:  Denies   Homicidal Behaviors:  Denies    Violent Behaviors:  Denies    Place of Residence:  Unstable Marital Status:  Single Employed/Unemployed:  Unknown Family Supports:  Limited Objective: Blood pressure 110/59, pulse 73, temperature 98.6 F (37 C), temperature source Oral, resp. rate 16, height 5\' 5"  (1.651 m), weight 161 lb (73.029 kg), last menstrual period 02/13/2013, SpO2 99.00%.Body mass index is 26.79 kg/(m^2). Results for orders placed during the hospital encounter of 02/14/13 (from the past 72 hour(s))  CBC WITH  DIFFERENTIAL     Status: Abnormal   Collection Time    02/15/13 12:05 AM      Result Value Range   WBC 13.5 (*) 4.0 - 10.5 K/uL   RBC 4.47  3.87 - 5.11 MIL/uL   Hemoglobin 12.3  12.0 - 15.0 g/dL   HCT 16.1  09.6 - 04.5 %   MCV 81.7  78.0 - 100.0 fL   MCH 27.5  26.0 - 34.0 pg   MCHC 33.7  30.0 - 36.0 g/dL   RDW 40.9  81.1 - 91.4 %   Platelets 227  150 - 400 K/uL   Neutrophils Relative % 74  43 - 77 %   Neutro Abs 10.0 (*) 1.7 - 7.7 K/uL   Lymphocytes Relative 15  12 - 46 %   Lymphs Abs 2.1  0.7 - 4.0 K/uL   Monocytes Relative 8  3 - 12 %   Monocytes Absolute 1.1 (*) 0.1 - 1.0 K/uL   Eosinophils Relative 3  0 - 5 %   Eosinophils Absolute 0.4  0.0 - 0.7 K/uL   Basophils Relative 0  0 - 1 %   Basophils Absolute 0.0  0.0 - 0.1 K/uL  BASIC METABOLIC PANEL     Status: Abnormal   Collection Time    02/15/13 12:05 AM      Result Value Range   Sodium 136  135 - 145 mEq/L   Potassium 3.4 (*) 3.5 - 5.1 mEq/L   Chloride 100  96 - 112 mEq/L   CO2 25  19 - 32 mEq/L   Glucose, Bld 125 (*) 70 - 99 mg/dL   BUN 13  6 - 23 mg/dL   Creatinine, Ser 7.82  0.50 - 1.10 mg/dL   Calcium 9.1  8.4 - 95.6 mg/dL   GFR calc non Af Amer >90  >90 mL/min   GFR calc Af Amer >90  >90 mL/min   Comment: (NOTE)     The eGFR has been calculated using the CKD EPI equation.     This calculation has not been validated in all clinical situations.     eGFR's persistently <90 mL/min signify possible Chronic Kidney     Disease.  CULTURE, BLOOD (ROUTINE X 2)     Status: None   Collection Time    02/15/13 12:05 AM      Result Value Range   Specimen Description BLOOD LEFT HAND     Special Requests BOTTLES DRAWN AEROBIC AND ANAEROBIC 3CC     Culture  Setup Time       Value: 02/15/2013 04:22     Performed at [facility]   Culture       Value:        BLOOD CULTURE RECEIVED NO GROWTH TO DATE CULTURE WILL BE HELD FOR 5 DAYS BEFORE ISSUING A FINAL NEGATIVE REPORT     Performed at [facility]   Report  Status PENDING    SEDIMENTATION RATE     Status: Abnormal   Collection Time    02/15/13 12:05 AM  Result Value Range   Sed Rate 25 (*) 0 - 22 mm/hr  CULTURE, BLOOD (ROUTINE X 2)     Status: None   Collection Time    02/15/13 12:10 AM      Result Value Range   Specimen Description BLOOD RIGHT HAND     Special Requests BOTTLES DRAWN AEROBIC AND ANAEROBIC 3CC     Culture  Setup Time       Value: 02/15/2013 04:22     Performed at [facility]   Culture       Value:        BLOOD CULTURE RECEIVED NO GROWTH TO DATE CULTURE WILL BE HELD FOR 5 DAYS BEFORE ISSUING A FINAL NEGATIVE REPORT     Performed at [facility]   Report Status PENDING    CG4 I-STAT (LACTIC ACID)     Status: None   Collection Time    02/15/13 12:37 AM      Result Value Range   Lactic Acid, Venous 1.42  0.5 - 2.2 mmol/L  COMPREHENSIVE METABOLIC PANEL     Status: Abnormal   Collection Time    02/15/13  9:00 AM      Result Value Range   Sodium 135  135 - 145 mEq/L   Potassium 4.1  3.5 - 5.1 mEq/L   Comment: REPEATED TO VERIFY     DELTA CHECK NOTED     NO VISIBLE HEMOLYSIS   Chloride 100  96 - 112 mEq/L   CO2 27  19 - 32 mEq/L   Glucose, Bld 101 (*) 70 - 99 mg/dL   BUN 12  6 - 23 mg/dL   Creatinine, Ser 4.09  0.50 - 1.10 mg/dL   Calcium 9.3  8.4 - 81.1 mg/dL   Total Protein 7.3  6.0 - 8.3 g/dL   Albumin 3.5  3.5 - 5.2 g/dL   AST 26  0 - 37 U/L   ALT 32  0 - 35 U/L   Alkaline Phosphatase 111  39 - 117 U/L   Total Bilirubin 0.7  0.3 - 1.2 mg/dL   GFR calc non Af Amer >90  >90 mL/min   GFR calc Af Amer >90  >90 mL/min   Comment: (NOTE)     The eGFR has been calculated using the CKD EPI equation.     This calculation has not been validated in all clinical situations.     eGFR's persistently <90 mL/min signify possible Chronic Kidney     Disease.  CBC WITH DIFFERENTIAL     Status: Abnormal   Collection Time    02/15/13  9:00 AM      Result Value Range   WBC 11.8 (*) 4.0 - 10.5 K/uL   RBC  4.73  3.87 - 5.11 MIL/uL   Hemoglobin 13.0  12.0 - 15.0 g/dL   HCT 91.4  78.2 - 95.6 %   MCV 82.7  78.0 - 100.0 fL   MCH 27.5  26.0 - 34.0 pg   MCHC 33.2  30.0 - 36.0 g/dL   RDW 21.3  08.6 - 57.8 %   Platelets 257  150 - 400 K/uL   Neutrophils Relative % 71  43 - 77 %   Neutro Abs 8.4 (*) 1.7 - 7.7 K/uL   Lymphocytes Relative 18  12 - 46 %   Lymphs Abs 2.1  0.7 - 4.0 K/uL   Monocytes Relative 7  3 - 12 %   Monocytes Absolute 0.9  0.1 -  1.0 K/uL   Eosinophils Relative 4  0 - 5 %   Eosinophils Absolute 0.4  0.0 - 0.7 K/uL   Basophils Relative 0  0 - 1 %   Basophils Absolute 0.0  0.0 - 0.1 K/uL  HIV ANTIBODY (ROUTINE TESTING)     Status: None   Collection Time    02/15/13  9:00 AM      Result Value Range   HIV NON REACTIVE  NON REACTIVE   Comment: Performed at [facility]  PREGNANCY, URINE     Status: None   Collection Time    02/15/13 10:54 AM      Result Value Range   Preg Test, Ur NEGATIVE  NEGATIVE   Comment:            THE SENSITIVITY OF THIS     METHODOLOGY IS >20 mIU/mL.  URINE RAPID DRUG SCREEN (HOSP PERFORMED)     Status: Abnormal   Collection Time    02/15/13 10:54 AM      Result Value Range   Opiates POSITIVE (*) NONE DETECTED   Cocaine POSITIVE (*) NONE DETECTED   Benzodiazepines NONE DETECTED  NONE DETECTED   Amphetamines NONE DETECTED  NONE DETECTED   Tetrahydrocannabinol POSITIVE (*) NONE DETECTED   Barbiturates NONE DETECTED  NONE DETECTED   Comment:            DRUG SCREEN FOR MEDICAL PURPOSES     ONLY.  IF CONFIRMATION IS NEEDED     FOR ANY PURPOSE, NOTIFY LAB     WITHIN 5 DAYS.                LOWEST DETECTABLE LIMITS     FOR URINE DRUG SCREEN     Drug Class       Cutoff (ng/mL)     Amphetamine      1000     Barbiturate      200     Benzodiazepine   200     Tricyclics       300     Opiates          300     Cocaine          300     THC              50  CBC     Status: Abnormal   Collection Time    02/16/13  5:19 AM      Result Value  Range   WBC 11.1 (*) 4.0 - 10.5 K/uL   RBC 4.79  3.87 - 5.11 MIL/uL   Hemoglobin 12.7  12.0 - 15.0 g/dL   HCT 16.1  09.6 - 04.5 %   MCV 81.8  78.0 - 100.0 fL   MCH 26.5  26.0 - 34.0 pg   MCHC 32.4  30.0 - 36.0 g/dL   RDW 40.9  81.1 - 91.4 %   Platelets 277  150 - 400 K/uL  BASIC METABOLIC PANEL     Status: Abnormal   Collection Time    02/16/13  5:19 AM      Result Value Range   Sodium 138  135 - 145 mEq/L   Potassium 3.5  3.5 - 5.1 mEq/L   Chloride 105  96 - 112 mEq/L   CO2 23  19 - 32 mEq/L   Glucose, Bld 130 (*) 70 - 99 mg/dL   BUN 12  6 - 23 mg/dL   Creatinine, Ser 7.82  0.50 - 1.10 mg/dL   Calcium 9.2  8.4 - 47.8 mg/dL   GFR calc non Af Amer >90  >90 mL/min   GFR calc Af Amer >90  >90 mL/min   Comment: (NOTE)     The eGFR has been calculated using the CKD EPI equation.     This calculation has not been validated in all clinical situations.     eGFR's persistently <90 mL/min signify possible Chronic Kidney     Disease.   Labs are reviewed and are pertinent for positive for cocaine, opiates and marijuana.  Current Facility-Administered Medications  Medication Dose Route Frequency Provider Last Rate Last Dose  . acetaminophen (TYLENOL) tablet 650 mg  650 mg Oral Q6H PRN Eduard Clos, MD      . cloNIDine (CATAPRES) tablet 0.1 mg  0.1 mg Oral QID Olivia Mackie, MD   0.1 mg at 02/16/13 1340   Followed by  . [START ON 02/17/2013] cloNIDine (CATAPRES) tablet 0.1 mg  0.1 mg Oral BH-qamhs Olivia Mackie, MD       Followed by  . [START ON 02/20/2013] cloNIDine (CATAPRES) tablet 0.1 mg  0.1 mg Oral QAC breakfast Olivia Mackie, MD      . dicyclomine (BENTYL) tablet 20 mg  20 mg Oral Q6H PRN Olivia Mackie, MD      . enoxaparin (LOVENOX) injection 40 mg  40 mg Subcutaneous Q24H Zannie Cove, MD   40 mg at 02/15/13 1442  . hydrOXYzine (ATARAX/VISTARIL) tablet 25 mg  25 mg Oral Q6H PRN Olivia Mackie, MD   25 mg at 02/16/13 0012  . loperamide (IMODIUM) capsule 2-4 mg  2-4 mg Oral PRN  Olivia Mackie, MD      . naproxen (NAPROSYN) tablet 500 mg  500 mg Oral BID PRN Olivia Mackie, MD      . ondansetron [facility]) tablet 4 mg  4 mg Oral Q6H PRN Eduard Clos, MD   4 mg at 02/16/13 0105   Or  . ondansetron (ZOFRAN) injection 4 mg  4 mg Intravenous Q6H PRN Eduard Clos, MD      . vancomycin (VANCOCIN) 1,500 mg in sodium chloride 0.9 % 500 mL IVPB  1,500 mg Intravenous Q12H Loma Messing [place], [facility]       Current Outpatient Prescriptions  Medication Sig Dispense Refill  . [START ON 02/17/2013] cloNIDine (CATAPRES) 0.1 MG tablet Take 1 tablet (0.1 mg total) by mouth 2 (two) times daily. 1 tab twice a day for 2 days then 1tab daily for 2 days then STOP  10 tablet  0  . doxycycline (VIBRA-TABS) 100 MG tablet Take 1 tablet (100 mg total) by mouth 2 (two) times daily. For 10days  20 tablet  0    Psychiatric Specialty Exam:     Blood pressure 110/59, pulse 73, temperature 98.6 F (37 C), temperature source Oral, resp. rate 16, height 5\' 5"  (1.651 m), weight 161 lb (73.029 kg), last menstrual period 02/13/2013, SpO2 99.00%.Body mass index is 26.79 kg/(m^2).  General Appearance: Disheveled and Tremulous  Eye Contact::  Fair  Speech:  fast  Volume:  Increased  Mood:  Anxious and Irritable  Affect:  Labile  Thought Process:  Loose  Orientation:  Full (Time, Place, and Person)  Thought Content:  Rumination  Suicidal Thoughts:  No  Homicidal Thoughts:  No  Memory:  Immediate;   Fair Recent;   Fair Remote;   Fair  Judgement:  Fair  Insight:  Fair  Psychomotor Activity:  Increased  Concentration:  Fair  Recall:  Fair  Akathisia:  No  Handed:  Right  AIMS (if indicated):     Assets:  Desire for Improvement  Sleep:      Treatment Plan Summary: Medication management Patient is on clonidine protocol.  Recommended Seroquel 25 mg twice a day and 50 mg at bedtime.  She had Seroquel in the past with good response.  Patient does not require inpatient psychiatric  treatment however she require long-term rehabilitation for her drug use.  She is currently on probation.  Social worker need to contact the probation officer to collaborate long-term rehabilitation.  Please call [phone] if you have any further questions.  Diquan Kassis T. 02/16/2013 4:38 PM

## 2013-02-21 LAB — CULTURE, BLOOD (ROUTINE X 2)
Culture: NO GROWTH
Culture: NO GROWTH

## 2013-04-13 ENCOUNTER — Encounter (HOSPITAL_COMMUNITY): Payer: Self-pay | Admitting: Emergency Medicine

## 2013-04-13 ENCOUNTER — Emergency Department (HOSPITAL_COMMUNITY)
Admission: EM | Admit: 2013-04-13 | Discharge: 2013-04-13 | Disposition: A | Discharge status: Home / Self Care | Payer: Self-pay | Attending: Emergency Medicine | Admitting: Emergency Medicine

## 2013-04-13 DIAGNOSIS — S01511A Laceration without foreign body of lip, initial encounter: Secondary | ICD-10-CM

## 2013-04-13 DIAGNOSIS — R112 Nausea with vomiting, unspecified: Secondary | ICD-10-CM | POA: Insufficient documentation

## 2013-04-13 DIAGNOSIS — Y929 Unspecified place or not applicable: Secondary | ICD-10-CM | POA: Insufficient documentation

## 2013-04-13 DIAGNOSIS — F17200 Nicotine dependence, unspecified, uncomplicated: Secondary | ICD-10-CM | POA: Insufficient documentation

## 2013-04-13 DIAGNOSIS — R296 Repeated falls: Secondary | ICD-10-CM | POA: Insufficient documentation

## 2013-04-13 DIAGNOSIS — Y9301 Activity, walking, marching and hiking: Secondary | ICD-10-CM | POA: Insufficient documentation

## 2013-04-13 DIAGNOSIS — S0010XA Contusion of unspecified eyelid and periocular area, initial encounter: Secondary | ICD-10-CM | POA: Insufficient documentation

## 2013-04-13 DIAGNOSIS — Z8659 Personal history of other mental and behavioral disorders: Secondary | ICD-10-CM | POA: Insufficient documentation

## 2013-04-13 DIAGNOSIS — S01501A Unspecified open wound of lip, initial encounter: Secondary | ICD-10-CM | POA: Insufficient documentation

## 2013-04-13 LAB — CBC WITH DIFFERENTIAL/PLATELET
BASOS PCT: 0 % (ref 0–1)
Basophils Absolute: 0 10*3/uL (ref 0.0–0.1)
EOS ABS: 0.4 10*3/uL (ref 0.0–0.7)
Eosinophils Relative: 3 % (ref 0–5)
HCT: 42.8 % (ref 36.0–46.0)
Hemoglobin: 14 g/dL (ref 12.0–15.0)
Lymphocytes Relative: 20 % (ref 12–46)
Lymphs Abs: 2.2 10*3/uL (ref 0.7–4.0)
MCH: 26.7 pg (ref 26.0–34.0)
MCHC: 32.7 g/dL (ref 30.0–36.0)
MCV: 81.5 fL (ref 78.0–100.0)
MONO ABS: 1.4 10*3/uL — AB (ref 0.1–1.0)
Monocytes Relative: 12 % (ref 3–12)
NEUTROS ABS: 7.3 10*3/uL (ref 1.7–7.7)
NEUTROS PCT: 65 % (ref 43–77)
Platelets: 240 10*3/uL (ref 150–400)
RBC: 5.25 MIL/uL — ABNORMAL HIGH (ref 3.87–5.11)
RDW: 13.8 % (ref 11.5–15.5)
WBC: 11.2 10*3/uL — ABNORMAL HIGH (ref 4.0–10.5)

## 2013-04-13 LAB — BASIC METABOLIC PANEL
BUN: 34 mg/dL — AB (ref 6–23)
CO2: 24 mEq/L (ref 19–32)
CREATININE: 0.9 mg/dL (ref 0.50–1.10)
Calcium: 10.2 mg/dL (ref 8.4–10.5)
Chloride: 97 mEq/L (ref 96–112)
GFR calc non Af Amer: 85 mL/min — ABNORMAL LOW (ref >90)
Glucose, Bld: 112 mg/dL — ABNORMAL HIGH (ref 70–99)
POTASSIUM: 4.7 meq/L (ref 3.7–5.3)
Sodium: 136 mEq/L — ABNORMAL LOW (ref 137–147)

## 2013-04-13 MED ORDER — ONDANSETRON 4 MG PO TBDP: ORAL_TABLET | ORAL | Status: DC

## 2013-04-13 MED ORDER — ONDANSETRON 4 MG PO TBDP
4.0000 mg | ORAL_TABLET | Freq: Once | ORAL | Status: AC
  Administered 2013-04-13: 4 mg via ORAL
  Filled 2013-04-13: qty 1

## 2013-04-13 MED ORDER — OXYCODONE-ACETAMINOPHEN 5-325 MG PO TABS
1.0000 | ORAL_TABLET | Freq: Once | ORAL | Status: AC
  Administered 2013-04-13: 1 via ORAL
  Filled 2013-04-13: qty 1

## 2013-04-13 NOTE — ED Notes (Signed)
Pt states fell last night sleep walking; states teeth went thru top and bottom lips; states can open and close jaw without difficulty; states hit left eye--bruising noted; neg neck pain; bilateral knee pain

## 2013-04-13 NOTE — ED Notes (Signed)
Pt c/o feeling dehydrated; vomiting yesterday; states went thru detox recently
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
## 2013-04-13 NOTE — Progress Notes (Signed)
[facility] provided pt with a list of primary care resources and [facility] information.  °

## 2013-04-13 NOTE — Discharge Instructions (Signed)
Absorbable Suture Repair °Absorbable sutures (stitches) hold skin together so you can heal. Keep skin wounds clean and dry for the next 2 to 3 days. Then, you may gently wash your wound and dress it with an antibiotic ointment as recommended. As your wound begins to heal, the sutures are no longer needed, and they typically begin to fall off. This will take 7 to 10 days. After 10 days, if your sutures are loose, you can remove them by wiping with a clean gauze pad or a cotton ball. Do not pull your sutures out. They should wipe away easily. If after 10 days they do not easily wipe away, have your caregiver take them out. Absorbable sutures may be used deep in a wound to help hold it together. If these stitches are below the skin, the body will absorb them completely in 3 to 4 weeks.  °You may need a tetanus shot if: °· You cannot remember when you had your last tetanus shot. °· You have never had a tetanus shot. °If you get a tetanus shot, your arm may swell, get red, and feel warm to the touch. This is common and not a problem. If you need a tetanus shot and you choose not to have one, there is a rare chance of getting tetanus. Sickness from tetanus can be serious. °SEEK IMMEDIATE MEDICAL CARE IF: °· You have redness in the wound area. °· The wound area feels hot to the touch. °· You develop swelling in the wound area. °· You develop pain. °· There is fluid drainage from the wound. °Document Released: 03/26/2004 Document Revised: 05/11/2011 Document Reviewed: 07/08/2010 °[employer]® Patient Information ©2014 [employer]. ° °Open Wound, Lip °An open wound is a break in the skin caused by an injury. An open wound to the lip can be a scrape, cut, or hole (puncture). Good wound care will help:  °· Lessen pain. °· Prevent infection. °· Lessen scarring. °HOME CARE °· Wash off all dirt. °· Clean your wounds daily with gentle soap and water. °· Eat soft foods or liquids while your wound is healing. °· Rinse the wound with  salt water after each meal. °· Apply medicated cream after the wound has been cleaned as told by your doctor. °· Follow up with your doctor as told. °GET HELP RIGHT AWAY IF:  °· There is more redness or puffiness (swelling) in or around the wound. °· There is increasing pain. °· You have a temperature by mouth above 102° F (38.9° C), not controlled by medicine. °· Your baby is older than 3 months with a rectal temperature of 102° F (38.9° C) or higher. °· Your baby is 3 months old or younger with a rectal temperature of 100.4° F (38° C) or higher. °· There is yellowish white fluid (pus) coming from the wound. °· Very bad pain develops that is not controlled with medicine. °· There is a red line on the skin above or below the wound. °MAKE SURE YOU:  °· Understand these instructions. °· Will watch this condition. °· Will get help right away if you are not doing well or get worse. °Document Released: 05/15/2008 Document Revised: 05/11/2011 Document Reviewed: 06/04/2009 °Exit[employer]® Patient Information ©2014 [employer]. ° °

## 2013-04-14 ENCOUNTER — Emergency Department (HOSPITAL_COMMUNITY)
Admission: EM | Admit: 2013-04-14 | Discharge: 2013-04-14 | Disposition: A | Discharge status: Home / Self Care | Payer: Self-pay | Attending: Emergency Medicine | Admitting: Emergency Medicine

## 2013-04-14 ENCOUNTER — Emergency Department (HOSPITAL_COMMUNITY): Payer: Self-pay

## 2013-04-14 ENCOUNTER — Encounter (HOSPITAL_COMMUNITY): Payer: Self-pay | Admitting: Emergency Medicine

## 2013-04-14 DIAGNOSIS — Z8659 Personal history of other mental and behavioral disorders: Secondary | ICD-10-CM | POA: Insufficient documentation

## 2013-04-14 DIAGNOSIS — F513 Sleepwalking [somnambulism]: Secondary | ICD-10-CM

## 2013-04-14 DIAGNOSIS — M533 Sacrococcygeal disorders, not elsewhere classified: Secondary | ICD-10-CM | POA: Insufficient documentation

## 2013-04-14 DIAGNOSIS — M5380 Other specified dorsopathies, site unspecified: Secondary | ICD-10-CM | POA: Insufficient documentation

## 2013-04-14 DIAGNOSIS — M62838 Other muscle spasm: Secondary | ICD-10-CM

## 2013-04-14 DIAGNOSIS — G478 Other sleep disorders: Secondary | ICD-10-CM | POA: Insufficient documentation

## 2013-04-14 DIAGNOSIS — F17200 Nicotine dependence, unspecified, uncomplicated: Secondary | ICD-10-CM | POA: Insufficient documentation

## 2013-04-14 MED ORDER — CYCLOBENZAPRINE HCL 10 MG PO TABS: 10.0000 mg | ORAL_TABLET | Freq: Two times a day (BID) | ORAL | Status: DC | PRN

## 2013-04-14 NOTE — ED Provider Notes (Signed)
CSN: [id number]     Arrival date & time 04/14/13  1303 History  This chart was scribed for non-physician practitioner, Raymon Mutton, [name] working with Audree Camel, MD by Luisa Dago, ED scribe. This patient was seen in room WTR9/WTR9 and the patient's care was started at 5:30 PM.    Chief Complaint  Patient presents with  . Tailbone Pain    The history is provided by the patient. No language interpreter was used.   HPI Comments: Kathy Burns is a 28 y.o. female who presents to the Emergency Department complaining of tailbone pain that started last night. Pt has a history of sleepwalking, which causes her to sometimes hurt herself. She states that she thinks she fell backwards while sleepwalking. Pt does not recall if she hit her head. She states that the pain is exacerbated by going from a sitting position to a standing position. She states that the pain does not radiate to her lower extremities. She was recently seen for an eye and lip injury that occurred while sleep walking. Pt states that sometimes she wakes up in the process of sleep walking. 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 Denies any nausea, emesis, chest pain, fever, chill, diaphoresis, SOB, or difficulty breathing.  Past Medical History  Diagnosis Date  . Bipolar 1 disorder    Past Surgical History  Procedure Laterality Date  . Tmj arthroplasty    . C section x 2      Family History  Problem Relation Age of Onset  . Hypertension Other   . Diabetes Other   . Cancer Other   . Stroke Other    History  Substance Use Topics  . Smoking status: Current Every Day Smoker    Types: Cigarettes  . Smokeless tobacco: Not on file  . Alcohol Use: No   OB History   Grav Para Term Preterm Abortions TAB SAB Ect Mult Living                 Review of Systems  Constitutional: Negative for fever, chills and diaphoresis.  Eyes: Negative for visual disturbance.  Respiratory:  Negative for cough and shortness of breath.   Cardiovascular: Negative for chest pain.  Gastrointestinal: Negative for nausea and vomiting.  Musculoskeletal: Positive for arthralgias (tailbone pain).  Psychiatric/Behavioral: Positive for sleep disturbance (sleep walking).  All other systems reviewed and are negative.      Allergies  Broccoli and Codeine  Home Medications   Current Outpatient Rx  Name  Route  Sig  Dispense  Refill  . acetaminophen (TYLENOL) 325 MG tablet   Oral   Take 650 mg by mouth every 6 (six) hours as needed for moderate pain.         Marland Kitchen ondansetron (ZOFRAN ODT) 4 MG disintegrating tablet      4mg  ODT q4 hours prn nausea/vomit   10 tablet   0    Triage vitals: BP 105/71  Pulse 98  Temp(Src) 99.4 F (37.4 C) (Oral)  Resp 16  SpO2 100%  LMP 04/12/2013  Physical Exam  Nursing note and vitals reviewed. Constitutional: She is oriented to person, place, and time. She appears well-developed and well-nourished.  HENT:  Head: Normocephalic and atraumatic.  Mouth/Throat: Oropharynx is clear and moist. No oropharyngeal exudate.  Ecchymosis to the left eye-healing well Laceration has been closed to the upper lip healing well-negative dehiscence or active bleeding or drainage-absorbable sutures noted.  Eyes: Conjunctivae  and EOM are normal. Pupils are equal, round, and reactive to light. Right eye exhibits no discharge. Left eye exhibits no discharge.  Negative nystagmus Negative signs of entrapment  Neck: Normal range of motion. Neck supple.  Cardiovascular: Normal rate, regular rhythm and normal heart sounds.  Exam reveals no friction rub.   No murmur heard. Pulses:      Radial pulses are 2+ on the right side, and 2+ on the left side.  Pulmonary/Chest: Effort normal and breath sounds normal. No respiratory distress. She has no wheezes. She has no rales.  Neurological: She is alert and oriented to person, place, and time.  Skin: Skin is warm and dry.   Psychiatric: She has a normal mood and affect.    ED Course  Procedures (including critical care time)  DIAGNOSTIC STUDIES: Oxygen Saturation is 100% on RA, normal by my interpretation.    COORDINATION OF CARE: 5:37 PM- Pt advised of plan for treatment and pt agrees.  Dg Lumbar Spine Complete  04/14/2013   CLINICAL DATA:  Low back pain  EXAM: LUMBAR SPINE - COMPLETE 4+ VIEW  COMPARISON:  None.  FINDINGS: There is no evidence of lumbar spine fracture. Alignment is normal. Intervertebral disc spaces are maintained.  IMPRESSION: Negative.   Electronically Signed   By: Elige Ko   On: 04/14/2013 17:34   Dg Sacrum/coccyx  04/14/2013   CLINICAL DATA:  Low back pain  EXAM: SACRUM AND COCCYX - 2+ VIEW  COMPARISON:  None.  FINDINGS: There is no evidence of fracture or other focal bone lesions  IMPRESSION: Negative.   Electronically Signed   By: Elige Ko   On: 04/14/2013 17:33    Labs Review Labs Reviewed - No data to display Imaging Review Dg Lumbar Spine Complete  04/14/2013   CLINICAL DATA:  Low back pain  EXAM: LUMBAR SPINE - COMPLETE 4+ VIEW  COMPARISON:  None.  FINDINGS: There is no evidence of lumbar spine fracture. Alignment is normal. Intervertebral disc spaces are maintained.  IMPRESSION: Negative.   Electronically Signed   By: Elige Ko   On: 04/14/2013 17:34   Dg Sacrum/coccyx  04/14/2013   CLINICAL DATA:  Low back pain  EXAM: SACRUM AND COCCYX - 2+ VIEW  COMPARISON:  None.  FINDINGS: There is no evidence of fracture or other focal bone lesions  IMPRESSION: Negative.   Electronically Signed   By: Elige Ko   On: 04/14/2013 17:33    EKG Interpretation   None       MDM   Final diagnoses:  Coccyx pain  Muscle spasm  Sleep walking    Filed Vitals:   04/14/13 1333 04/14/13 1756  BP: 105/71 105/59  Pulse: 98 87  Temp: 99.4 F (37.4 C) 98.8 F (37.1 C)  TempSrc: Oral Oral  Resp: 16 18  SpO2: 100% 100%   I personally performed the services described in  this documentation, which was scribed in my presence. The recorded information has been reviewed and is accurate.  Patient presenting to emergency Department tailbone discomfort that occurred yesterday while patient was sleeping. Patient reported that since she's been off of opiates she has been having sleepwalking issues where she will walk in wakeup and random areas of the house with certain injuries. Reported that she was seen and assessed yesterday in the ED where she had a lip laceration and black eyes secondary to falling and hitting her head 2 days ago. Patient reported that yesterday she continued to fall backwards  landing on her coccyx. Reported that she's been experiencing tailbone pain described as a muscle strain and spasms. Denied numbness, tingling, urinary and bowel continence. Alert and oriented. GCS 15. Heart rate and rhythm normal. Lungs good auscultation. Full range of motion to upper lower extremities bilaterally without difficulty noted. Radial pulses 2+ bilaterally. Discomfort upon palpation to the coccyx region mid spinal-negative deformities or ecchymosis noted. Negative bulging identified. Sensation intact with differentiation to sharp and dull touch. Negative bilateral saddle paresthesias noted. Gait proper, proper balance. Plain films of coccyx/lumbar spine negative findings noted. 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 Reported that she's her when approximately 2 days ago secondary to her craving - reported that she has not used since then. Patient reported that she has appointment with behavioral health hospital regarding insomnia. Discussed with patient to rest and stay hydrated. Discharge patient with muscle relaxer. Patient stable, afebrile. Discharged patient. Referred patient to urgent care Center and resource guide given. Discussed with patient to rest and stay hydrated. Discussed with patient to closely monitor symptoms  and if symptoms are to worsen or change to report back to the ED - strict return instructions given.  Patient agreed to plan of care, understood, all questions answered.    Raymon MuttonMarissa Chandrea Zellman, [name] 04/15/13 1204

## 2013-04-14 NOTE — Discharge Instructions (Signed)
X-rays of lower back and tailbone were negative for fractures or broken bones or dislocations Please call your doctor for a followup appointment within 24-48 hours. When you talk to your doctor please let them know that you were seen in the emergency department and have them acquire all of your records so that they can discuss the findings with you and formulate a treatment plan to fully care for your new and ongoing problems. Please take medications as prescribed Please rest and stay hydrated Please apply warm compressions Please continue monitor symptoms closely and if symptoms are to worsen or change (fever greater than 101, chills, numbness, nausea, vomiting, head injury, loss of consciousness, chest pain, shortness of breath, difficulty breathing, worsening pain, worsening symptoms) please report back emergency room immediately   Lumbosacral Strain Lumbosacral strain is a strain of any of the parts that make up your lumbosacral vertebrae. Your lumbosacral vertebrae are the bones that make up the lower third of your backbone. Your lumbosacral vertebrae are held together by muscles and tough, fibrous tissue (ligaments).  CAUSES  A sudden blow to your back can cause lumbosacral strain. Also, anything that causes an excessive stretch of the muscles in the low back can cause this strain. This is typically seen when people exert themselves strenuously, fall, lift heavy objects, bend, or crouch repeatedly. RISK FACTORS  Physically demanding work.  Participation in pushing or pulling sports or sports that require sudden twist of the back (tennis, golf, baseball).  Weight lifting.  Excessive lower back curvature.  Forward-tilted pelvis.  Weak back or abdominal muscles or both.  Tight hamstrings. SIGNS AND SYMPTOMS  Lumbosacral strain may cause pain in the area of your injury or pain that moves (radiates) down your leg.  DIAGNOSIS Your health care provider can often diagnose lumbosacral  strain through a physical exam. In some cases, you may need tests such as X-ray exams.  TREATMENT  Treatment for your lower back injury depends on many factors that your clinician will have to evaluate. However, most treatment will include the use of anti-inflammatory medicines. HOME CARE INSTRUCTIONS   Avoid hard physical activities (tennis, racquetball, waterskiing) if you are not in proper physical condition for it. This may aggravate or create problems.  If you have a back problem, avoid sports requiring sudden body movements. Swimming and walking are generally safer activities.  Maintain good posture.  Maintain a healthy weight.  For acute conditions, you may put ice on the injured area.  Put ice in a plastic bag.  Place a towel between your skin and the bag.  Leave the ice on for 20 minutes, 2 3 times a day.  When the low back starts healing, stretching and strengthening exercises may be recommended. SEEK MEDICAL CARE IF:  Your back pain is getting worse.  You experience severe back pain not relieved with medicines. SEEK IMMEDIATE MEDICAL CARE IF:   You have numbness, tingling, weakness, or problems with the use of your arms or legs.  There is a change in bowel or bladder control.  You have increasing pain in any area of the body, including your belly (abdomen).  You notice shortness of breath, dizziness, or feel faint.  You feel sick to your stomach (nauseous), are throwing up (vomiting), or become sweaty.  You notice discoloration of your toes or legs, or your feet get very cold. MAKE SURE YOU:   Understand these instructions.  Will watch your condition.  Will get help right away if you are not doing  well or get worse. Document Released: 11/26/2004 Document Revised: 12/07/2012 Document Reviewed: 10/05/2012 [facility] Patient Information 2014 [place], [place].   Emergency Department Resource Guide 1) Find a Doctor and Pay Out of Pocket Although you won't have  to find out who is covered by your insurance plan, it is a good idea to ask around and get recommendations. You will then need to call the office and see if the doctor you have chosen will accept you as a new patient and what types of options they offer for patients who are self-pay. Some doctors offer discounts or will set up payment plans for their patients who do not have insurance, but you will need to ask so you aren't surprised when you get to your appointment.  2) Contact Your Local Health Department Not all health departments have doctors that can see patients for sick visits, but many do, so it is worth a call to see if yours does. If you don't know where your local health department is, you can check in your phone book. The CDC also has a tool to help you locate your state's health department, and many state websites also have listings of all of their local health departments.  3) Find a Walk-in Clinic If your illness is not likely to be very severe or complicated, you may want to try a walk in clinic. These are popping up all over the country in pharmacies, drugstores, and shopping centers. They're usually staffed by nurse practitioners or physician assistants that have been trained to treat common illnesses and complaints. They're usually fairly quick and inexpensive. However, if you have serious medical issues or chronic medical problems, these are probably not your best option.  No Primary Care Doctor: - Call Health Connect at  [phone] - they can help you locate a primary care doctor that  accepts your insurance, provides certain services, etc. - Physician Referral Service- [phone]  Chronic Pain Problems: Organization         Address  Phone   Notes  [facility]  [phone] Patients need to be referred by their primary care doctor.   Medication Assistance: Organization         Address  Phone   Notes  [facility] Medication [facility] [address] [place], [place] [address] [phone] --Must be a resident of [facility] -- Must have NO insurance coverage whatsoever (no Medicaid/ Medicare, etc.) -- The pt. MUST have a primary care doctor that directs their care regularly and follows them in the community   MedAssist  [phone]   [employer]  [phone]    Agencies that provide inexpensive medical care: Organization         Address  Phone   Notes  [facility]  [phone]   [facility]    [phone]   [facility] [address] [place], [place] [address] [phone]   [facility] [address] [place] [place] [phone]   Planned Parenthood    [phone]   [facility]    [phone]   [facility]  [address], [place] Phone:  [phone], Fax:  [phone] Hours of Operation:  9 am - 6 pm, M-F.  Also accepts Medicaid/Medicare and self-pay.  [facility]  [address], [place] Phone: [phone], Fax: [phone])  [phone]. Hours of Operation:  8:30 am - 5:30 pm, M-F.  Also accepts Medicaid and self-pay.  [facility] [place] [address] [place] Phone: [phone]   [facility] [address] Natasha Bence [place], [place] [phone], Ext. [phone] Mondays & Thursdays: 7-9 AM.  First 15 patients are seen on a first come, first serve basis.    Medicaid-accepting [facility] Providers:  Organization         Address  Phone   Notes  [facility] [address], [place] [phone] Also accepts self-pay patients.  [facility] [address] Laurell Josephs [place], [place]  [phone]   [facility] [address], [place] [phone]   [facility] [address], [place] [phone]   Renaye Rakers [address], [place]   [phone] Only accepts [place] patients after they have their name applied to their card.   Self-Pay (no insurance) in [facility]:  Organization         Address  Phone   Notes  Sickle Cell Patients, [facility] [address] [place], [place] [phone]   [facility] [address] [place], [place] [phone]   [facility] [place]  [address], [place] [phone]   [facility]/Dr. Osei-Bonsu  [address] [place] or [address], [place] [phone] Phone number for both [place] and [place] locations is the same.  [facility]gent [facility] and [facility] [address], [place] [phone]   [facility] [address] [place] or [address] [phone]   [facility] [address] [place] [phone], phone; [phone], fax Sees patients 1st and 3rd Saturday of every month.  Must not qualify for public or private insurance (i.e. Medicaid, Medicare, [place] Health Choice, Veterans' Benefits)  Household income should be no more than 200% of the poverty level The clinic cannot treat you if you are pregnant or think you are pregnant  Sexually transmitted diseases are not treated at the clinic.    Dental Care: Organization         Address  Phone  Notes  [facility] Department of [facility] [address] [place], [place] [phone] Accepts children up to age 74 who are enrolled in [place] or [place] Health Choice; pregnant women with a Medicaid card; and children who have applied for Medicaid or [place] Health Choice, but were declined, whose parents can pay a reduced fee at time of service.  [facility]  [address], [place] [phone] Accepts children up to age  38 who are enrolled in [place] or [place] Health Choice; pregnant women with a Medicaid card; and children who have applied for Medicaid or [place] Health Choice, but were declined, whose parents can pay a reduced fee at time of service.  [facility]  [address] [place], [place] [phone] Patients are seen by appointment only. Walk-ins are not accepted. [facility] will see patients 20 years of age and older. Monday - Tuesday (8am-5pm) Most Wednesdays (8:30-5pm) $30 per visit, cash only  [facility] [employer] PROGRAM  [address], [place] [phone]  Patients are seen by appointment only. Walk-ins are not accepted. [facility] will see patients 22 years of age and older. One Wednesday Evening (Monthly: Volunteer Based).  $30 per visit, cash only  [employer]  [phone] for adults; Children under age 56, call Graduate Pediatric Dentistry at [phone]. Children aged 9-14, please call [phone] to request a pediatric application.  Dental services are provided in all areas of dental care including fillings, crowns and bridges, complete and partial dentures, implants, gum treatment, root canals, and extractions. Preventive care is also provided. Treatment is provided to both adults and children. Patients are selected via a lottery and there is often a waiting list.   [facility] [address], [place]  [phone] [email or web address]   [facility] [address] [place], [place] [phone], Ext. [phone] Second and Fourth Thursday of each month, opens at 6:30 AM; Clinic ends at 9 AM.  Patients are seen on a first-come first-served basis, and a limited number are seen during each clinic.   [facility]  [address] [place], [place] [phone]   Eligibility Requirements You must have lived in [place], [place], or [place] counties for at least the last three  months.   You cannot be eligible for state or federal sponsored [place], including [employer], [place], or [employer].   You generally cannot be eligible for healthcare insurance through your employer.    How to apply: Eligibility screenings are held every Tuesday and Wednesday afternoon from 1:00 pm until 4:00 pm. You do not need an appointment for the interview!  [facility] [address] [place], [place] [phone]   [facility] Health Department  [phone]   [facility] Health Department  [phone]   [facility] Health Department  [phone]    Behavioral Health Resources in the Community: Intensive Outpatient Programs Organization         Address  Phone  Notes  [facility] [address], [place], [place] [phone]   [facility] Outpatient [address], [place], [place] [phone]   [facility]: [facility] [address], [place], [place]  [phone]   [facility] Mental Health [name],  [place], [place] [phone] or [phone]   Substance Abuse Resources Organization         Address  Phone  Notes  Alcohol [facility] Drug Services  [phone]   [facility]  4121244236   [facility]  [phone]   Floydene Flock  [phone]   Residential & Outpatient Substance Abuse Program  [phone]   Psychological Services Organization         Address  Phone  Notes  [facility] Behavioral Health  [phone]   [facility] Services  [phone]   [facility] Mental Health [address] [place] [phone] or [phone]    Mobile Crisis Teams Organization         Address  Phone  Notes  [facility], Mobile Crisis Care Unit  [phone]   [facility]  [address] [place], [place] [phone]   Doristine Locks [address] [place] [phone]    Self-Help/Support  Groups Organization         Address  Phone             Notes  Mental Health Assoc. of [facility] - variety of support groups  336- I7437963  Call for more information  Narcotics Anonymous (NA), [facility] [address], [place]  2 meetings at this location   Residential Sports administratorTreatment Programs Organization         Address  Phone  Notes  [facility] [address],    [place]  [phone]   [facility]  [address] [place] [address], [place] [phone], [place], [place] [phone]   [facility] [address], [place] [phone] Admissions: 8am-3pm M-F  [facility] [address]    [place], [place] [phone]   [facility] [address], [place], [place] [phone]   [facility] [address]  [place], [place] [phone]   [facility] - Intensive Outpatient [address], [place], [place] [phone]   [facility] ([facility]iction Recovery [facility] [address]  [place], [place] [phone] or [phone]   [facility] (RTS) [address] [place], [place] [phone] Accepts Medicaid  [facility] [address]  [place] [phone] Substance Abuse/Addiction Treatment   [facility] Behavioral Health Resources Organization         Address  Phone  Notes  [facility]  [phone](888) [phone]   Angie FavaJulie Brannon, PhD [address], [address] [place], [place]   [phone](336) [phone] or [phone]) [phone]   [facility]   [address] [place], [place] [phone]) [phone]   [facility] [address], [place], [place] [phone](336) [phone] Insurance/Medicaid/sponsorship through [facility]  [facility] Families [address]                                    [place], [place] [phone](336) [phone] Therapy/tele-psych/case  [facility] [address]   [place], [place] [phone](336) [phone]    Dr. Lolly MustacheArfeen  [phone](336) [phone]   [facility] [place]  [employer] [facility] 1) [address], [place] 2) [address], [place] 3)  [address], [place] [phone]) [phone]) [phone]  [phone](336) [phone]   [facility] Child Abuse Hotline 6475121449(336) [phone] or [phone](336) [phone] (After Hours)

## 2013-04-14 NOTE — ED Notes (Signed)
Pt states falls with sleep walking.

## 2013-04-14 NOTE — ED Notes (Signed)
Pt here with tailbone pain.  Pain started last night.  Pt states she is fighting herself in her sleep.  Pt was seen recently for lip and eye injury d/t sleep pattern.

## 2013-04-15 NOTE — ED Provider Notes (Signed)
Medical screening examination/treatment/procedure(s) were performed by non-physician practitioner and as supervising physician I was immediately available for consultation/collaboration.  EKG Interpretation   None         Audree CamelScott T Lunetta Marina, MD 04/15/13 1754

## 2014-11-22 ENCOUNTER — Emergency Department (HOSPITAL_COMMUNITY)
Admission: EM | Admit: 2014-11-22 | Discharge: 2014-11-22 | Disposition: A | Discharge status: Home / Self Care | Payer: Self-pay | Attending: Emergency Medicine | Admitting: Emergency Medicine

## 2014-11-22 ENCOUNTER — Encounter (HOSPITAL_COMMUNITY): Payer: Self-pay | Admitting: *Deleted

## 2014-11-22 DIAGNOSIS — S01112A Laceration without foreign body of left eyelid and periocular area, initial encounter: Secondary | ICD-10-CM | POA: Insufficient documentation

## 2014-11-22 DIAGNOSIS — Y9289 Other specified places as the place of occurrence of the external cause: Secondary | ICD-10-CM | POA: Insufficient documentation

## 2014-11-22 DIAGNOSIS — X58XXXA Exposure to other specified factors, initial encounter: Secondary | ICD-10-CM | POA: Insufficient documentation

## 2014-11-22 DIAGNOSIS — Z720 Tobacco use: Secondary | ICD-10-CM | POA: Insufficient documentation

## 2014-11-22 DIAGNOSIS — Z8659 Personal history of other mental and behavioral disorders: Secondary | ICD-10-CM | POA: Insufficient documentation

## 2014-11-22 DIAGNOSIS — Y9301 Activity, walking, marching and hiking: Secondary | ICD-10-CM | POA: Insufficient documentation

## 2014-11-22 DIAGNOSIS — Y998 Other external cause status: Secondary | ICD-10-CM | POA: Insufficient documentation

## 2014-11-22 MED ORDER — LIDOCAINE HCL (PF) 1 % IJ SOLN
2.0000 mL | Freq: Once | INTRAMUSCULAR | Status: AC
  Administered 2014-11-22: 2 mL
  Filled 2014-11-22: qty 5

## 2014-11-22 NOTE — ED Notes (Signed)
md at bedside

## 2014-11-22 NOTE — Discharge Instructions (Signed)
Facial Laceration  A facial laceration is a cut on the face. These injuries can be painful and cause bleeding. Lacerations usually heal quickly, but they need special care to reduce scarring. DIAGNOSIS  Your health care provider will take a medical history, ask for details about how the injury occurred, and examine the wound to determine how deep the cut is. TREATMENT  Some facial lacerations may not require closure. Others may not be able to be closed because of an increased risk of infection. The risk of infection and the chance for successful closure will depend on various factors, including the amount of time since the injury occurred. The wound may be cleaned to help prevent infection. If closure is appropriate, pain medicines may be given if needed. Your health care provider will use stitches (sutures), wound glue (adhesive), or skin adhesive strips to repair the laceration. These tools bring the skin edges together to allow for faster healing and a better cosmetic outcome. If needed, you may also be given a tetanus shot. HOME CARE INSTRUCTIONS  Only take over-the-counter or prescription medicines as directed by your health care provider.  Follow your health care provider's instructions for wound care. These instructions will vary depending on the technique used for closing the wound. For Sutures:  Keep the wound clean and dry.   If you were given a bandage (dressing), you should change it at least once a day. Also change the dressing if it becomes wet or dirty, or as directed by your health care provider.   Wash the wound with soap and water 2 times a day. Rinse the wound off with water to remove all soap. Pat the wound dry with a clean towel.   After cleaning, apply a thin layer of the antibiotic ointment recommended by your health care provider. This will help prevent infection and keep the dressing from sticking.   You may shower as usual after the first 24 hours. Do not soak the  wound in water until the sutures are removed.   Get your sutures removed as directed by your health care provider. With facial lacerations, sutures should usually be taken out after 4-5 days to avoid stitch marks.   Wait a few days after your sutures are removed before applying any makeup. For Skin Adhesive Strips:  Keep the wound clean and dry.   Do not get the skin adhesive strips wet. You may bathe carefully, using caution to keep the wound dry.   If the wound gets wet, pat it dry with a clean towel.   Skin adhesive strips will fall off on their own. You may trim the strips as the wound heals. Do not remove skin adhesive strips that are still stuck to the wound. They will fall off in time.  For Wound Adhesive:  You may briefly wet your wound in the shower or bath. Do not soak or scrub the wound. Do not swim. Avoid periods of heavy sweating until the skin adhesive has fallen off on its own. After showering or bathing, gently pat the wound dry with a clean towel.   Do not apply liquid medicine, cream medicine, ointment medicine, or makeup to your wound while the skin adhesive is in place. This may loosen the film before your wound is healed.   If a dressing is placed over the wound, be careful not to apply tape directly over the skin adhesive. This may cause the adhesive to be pulled off before the wound is healed.   Avoid   prolonged exposure to sunlight or tanning lamps while the skin adhesive is in place.  The skin adhesive will usually remain in place for 5-10 days, then naturally fall off the skin. Do not pick at the adhesive film.  After Healing: Once the wound has healed, cover the wound with sunscreen during the day for 1 full year. This can help minimize scarring. Exposure to ultraviolet light in the first year will darken the scar. It can take 1-2 years for the scar to lose its redness and to heal completely.  SEEK IMMEDIATE MEDICAL CARE IF:  You have redness, pain, or  swelling around the wound.   You see ayellowish-white fluid (pus) coming from the wound.   You have chills or a fever.  MAKE SURE YOU:  Understand these instructions.  Will watch your condition.  Will get help right away if you are not doing well or get worse. Document Released: 03/26/2004 Document Revised: 12/07/2012 Document Reviewed: 09/29/2012 ExitCare Patient Information 2015 [employer]. This information is not intended to replace advice given to you by your health care provider. Make sure you discuss any questions you have with your health care provider.  

## 2014-11-22 NOTE — ED Provider Notes (Signed)
CSN: [id number]     Arrival date & time 11/22/14  0753 History   First MD Initiated Contact with Patient 11/22/14 0805     Chief Complaint  Patient presents with  . Eyebrow Laceration      (Consider location/radiation/quality/duration/timing/severity/associated sxs/prior Treatment) HPI Patient states she walked into a doorway this morning. This caused a left eyebrow laceration. She reports it was bleeding at first and it is not now. She reports that she came because she didn't want a scar in that area. She does note however she already has stitches in the same place previously. She states if the wound have been anywhere else she would not come to the hospital for it. No LOC, no headache. Past Medical History  Diagnosis Date  . Bipolar 1 disorder    Past Surgical History  Procedure Laterality Date  . Tmj arthroplasty    . C section x 2      Family History  Problem Relation Age of Onset  . Hypertension Other   . Diabetes Other   . Cancer Other   . Stroke Other    Social History  Substance Use Topics  . Smoking status: Current Every Day Smoker    Types: Cigarettes  . Smokeless tobacco: None  . Alcohol Use: No   OB History    No data available     Review of Systems Constitutional: No recent fevers or chills   Allergies  Broccoli and Codeine  Home Medications   Prior to Admission medications   Medication Sig Start Date End Date Taking? Authorizing Provider  acetaminophen (TYLENOL) 325 MG tablet Take 650 mg by mouth every 6 (six) hours as needed for moderate pain.    Historical Provider, MD  cyclobenzaprine (FLEXERIL) 10 MG tablet Take 1 tablet (10 mg total) by mouth 2 (two) times daily as needed for muscle spasms. 04/14/13   Marissa Sciacca, [name]  ondansetron (ZOFRAN ODT) 4 MG disintegrating tablet  ODT q4 hours prn nausea/vomit 04/13/13   Toy Cookey, MD   BP 128/65 mmHg  Pulse 117  Temp(Src) 98 F (36.7 C) (Oral)  Resp 17  SpO2 97%  LMP  11/21/2014 Physical Exam  Constitutional: She is oriented to person, place, and time. She appears well-developed and well-nourished. No distress.  HENT:  Patient has a 1 cm laceration in the eyebrow on the left side. This is obliquely oriented with no active bleeding. Laceration is into the deep dermis with a depth of approximately 2 mm.  Eyes: EOM are normal. Pupils are equal, round, and reactive to light.  Pulmonary/Chest: Effort normal.  Neurological: She is alert and oriented to person, place, and time. No cranial nerve deficit.  Skin: Skin is warm and dry.  Psychiatric: She has a normal mood and affect.    ED Course  LACERATION REPAIR Date/Time: 11/22/2014 8:41 AM Performed by: Arby Barrette Authorized by: Arby Barrette Body area: head/neck Location details: left eyebrow Laceration length: 1 cm Foreign bodies: no foreign bodies Tendon involvement: none Nerve involvement: none Vascular damage: no Anesthesia: local infiltration Local anesthetic: lidocaine 1% without epinephrine Anesthetic total: 1 ml Patient sedated: no Irrigation solution: saline Amount of cleaning: standard Debridement: none Degree of undermining: none Skin closure: 6-0 nylon Number of sutures: 3 Technique: simple Approximation: close Approximation difficulty: simple Dressing: antibiotic ointment   (including critical care time) Labs Review Labs Reviewed - No data to display  Imaging Review No results found. I have personally reviewed and evaluated these images and lab results  as part of my medical decision-making.   EKG Interpretation None      MDM   Final diagnoses:  Eyebrow laceration, left, initial encounter   Simple brow laceration repair in an otherwise well patient.    Arby Barrette, MD 11/22/14 [phone]

## 2014-11-22 NOTE — ED Notes (Signed)
Pt escorted to discharge window. Pt verbalized understanding discharge instructions. In no acute distress.  

## 2014-11-22 NOTE — ED Notes (Signed)
Pt reports she walked into a doorway this morning, when she got up to use the bathroom.estimated 1 inch laceration to left eyebrow. Pain 4/10. Bleeding controlled.

## 2016-03-28 ENCOUNTER — Encounter (HOSPITAL_COMMUNITY): Payer: Self-pay

## 2016-03-28 ENCOUNTER — Inpatient Hospital Stay (HOSPITAL_COMMUNITY)
Admission: AD | Admit: 2016-03-28 | Discharge: 2016-03-28 | Disposition: A | Discharge status: Home / Self Care | Payer: Self-pay | Source: Ambulatory Visit | Attending: Obstetrics and Gynecology | Admitting: Obstetrics and Gynecology

## 2016-03-28 ENCOUNTER — Inpatient Hospital Stay (HOSPITAL_COMMUNITY): Payer: Self-pay

## 2016-03-28 DIAGNOSIS — Z3A12 12 weeks gestation of pregnancy: Secondary | ICD-10-CM | POA: Insufficient documentation

## 2016-03-28 DIAGNOSIS — F17210 Nicotine dependence, cigarettes, uncomplicated: Secondary | ICD-10-CM | POA: Insufficient documentation

## 2016-03-28 DIAGNOSIS — Z79899 Other long term (current) drug therapy: Secondary | ICD-10-CM | POA: Insufficient documentation

## 2016-03-28 DIAGNOSIS — O99320 Drug use complicating pregnancy, unspecified trimester: Secondary | ICD-10-CM

## 2016-03-28 DIAGNOSIS — O99321 Drug use complicating pregnancy, first trimester: Secondary | ICD-10-CM | POA: Insufficient documentation

## 2016-03-28 DIAGNOSIS — O98411 Viral hepatitis complicating pregnancy, first trimester: Secondary | ICD-10-CM | POA: Insufficient documentation

## 2016-03-28 DIAGNOSIS — O26891 Other specified pregnancy related conditions, first trimester: Secondary | ICD-10-CM | POA: Insufficient documentation

## 2016-03-28 DIAGNOSIS — Z8249 Family history of ischemic heart disease and other diseases of the circulatory system: Secondary | ICD-10-CM | POA: Insufficient documentation

## 2016-03-28 DIAGNOSIS — F319 Bipolar disorder, unspecified: Secondary | ICD-10-CM | POA: Insufficient documentation

## 2016-03-28 DIAGNOSIS — Z888 Allergy status to other drugs, medicaments and biological substances status: Secondary | ICD-10-CM | POA: Insufficient documentation

## 2016-03-28 DIAGNOSIS — Z9889 Other specified postprocedural states: Secondary | ICD-10-CM | POA: Insufficient documentation

## 2016-03-28 DIAGNOSIS — J209 Acute bronchitis, unspecified: Secondary | ICD-10-CM | POA: Insufficient documentation

## 2016-03-28 DIAGNOSIS — Z823 Family history of stroke: Secondary | ICD-10-CM | POA: Insufficient documentation

## 2016-03-28 DIAGNOSIS — R0989 Other specified symptoms and signs involving the circulatory and respiratory systems: Secondary | ICD-10-CM | POA: Insufficient documentation

## 2016-03-28 DIAGNOSIS — J0100 Acute maxillary sinusitis, unspecified: Secondary | ICD-10-CM | POA: Insufficient documentation

## 2016-03-28 DIAGNOSIS — B1920 Unspecified viral hepatitis C without hepatic coma: Secondary | ICD-10-CM | POA: Insufficient documentation

## 2016-03-28 DIAGNOSIS — Z809 Family history of malignant neoplasm, unspecified: Secondary | ICD-10-CM | POA: Insufficient documentation

## 2016-03-28 DIAGNOSIS — Z833 Family history of diabetes mellitus: Secondary | ICD-10-CM | POA: Insufficient documentation

## 2016-03-28 DIAGNOSIS — O99341 Other mental disorders complicating pregnancy, first trimester: Secondary | ICD-10-CM | POA: Insufficient documentation

## 2016-03-28 DIAGNOSIS — O99331 Smoking (tobacco) complicating pregnancy, first trimester: Secondary | ICD-10-CM | POA: Insufficient documentation

## 2016-03-28 HISTORY — DX: Inflammatory liver disease, unspecified: K75.9

## 2016-03-28 LAB — URINALYSIS, ROUTINE W REFLEX MICROSCOPIC
BILIRUBIN URINE: NEGATIVE
Glucose, UA: NEGATIVE mg/dL
Hgb urine dipstick: NEGATIVE
Ketones, ur: NEGATIVE mg/dL
Leukocytes, UA: NEGATIVE
NITRITE: NEGATIVE
Protein, ur: NEGATIVE mg/dL
SPECIFIC GRAVITY, URINE: 1.032 — AB (ref 1.005–1.030)
pH: 5 (ref 5.0–8.0)

## 2016-03-28 LAB — WET PREP, GENITAL
Clue Cells Wet Prep HPF POC: NONE SEEN
SPERM: NONE SEEN
Trich, Wet Prep: NONE SEEN
YEAST WET PREP: NONE SEEN

## 2016-03-28 LAB — POCT PREGNANCY, URINE: PREG TEST UR: POSITIVE — AB

## 2016-03-28 LAB — RAPID URINE DRUG SCREEN, HOSP PERFORMED
AMPHETAMINES: NOT DETECTED
BENZODIAZEPINES: NOT DETECTED
Barbiturates: NOT DETECTED
Cocaine: POSITIVE — AB
Opiates: POSITIVE — AB
TETRAHYDROCANNABINOL: POSITIVE — AB

## 2016-03-28 MED ORDER — AMOXICILLIN-POT CLAVULANATE 875-125 MG PO TABS: 1.0000 | ORAL_TABLET | Freq: Two times a day (BID) | ORAL | 0 refills | Status: DC

## 2016-03-28 MED ORDER — ALBUTEROL SULFATE HFA 108 (90 BASE) MCG/ACT IN AERS: 1.0000 | INHALATION_SPRAY | Freq: Four times a day (QID) | RESPIRATORY_TRACT | 0 refills | Status: DC | PRN

## 2016-03-28 MED ORDER — BENZONATATE 100 MG PO CAPS: 100.0000 mg | ORAL_CAPSULE | Freq: Three times a day (TID) | ORAL | 0 refills | Status: DC
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
## 2016-03-28 NOTE — Discharge Instructions (Signed)
Acute Bronchitis, Adult Acute bronchitis is when air tubes (bronchi) in the lungs suddenly get swollen. The condition can make it hard to breathe. It can also cause these symptoms:  A cough.  Coughing up clear, yellow, or green mucus.  Wheezing.  Chest congestion.  Shortness of breath.  A fever.  Body aches.  Chills.  A sore throat. Follow these instructions at home: Medicines  Take over-the-counter and prescription medicines only as told by your doctor.  If you were prescribed an antibiotic medicine, take it as told by your doctor. Do not stop taking the antibiotic even if you start to feel better. General instructions  Rest.  Drink enough fluids to keep your pee (urine) clear or pale yellow.  Avoid smoking and secondhand smoke. If you smoke and you need help quitting, ask your doctor. Quitting will help your lungs heal faster.  Use an inhaler, cool mist vaporizer, or humidifier as told by your doctor.  Keep all follow-up visits as told by your doctor. This is important. How is this prevented? To lower your risk of getting this condition again:  Wash your hands often with soap and water. If you cannot use soap and water, use hand sanitizer.  Avoid contact with people who have cold symptoms.  Try not to touch your hands to your mouth, nose, or eyes.  Make sure to get the flu shot every year. Contact a doctor if:  Your symptoms do not get better in 2 weeks. Get help right away if:  You cough up blood.  You have chest pain.  You have very bad shortness of breath.  You become dehydrated.  You faint (pass out) or keep feeling like you are going to pass out.  You keep throwing up (vomiting).  You have a very bad headache.  Your fever or chills gets worse. This information is not intended to replace advice given to you by your health care provider. Make sure you discuss any questions you have with your health care provider. Document Released: 08/05/2007  Document Revised: 09/25/2015 Document Reviewed: 08/07/2015 Elsevier Interactive Patient Education  2017 [employer] Sinusitis, Adult Sinusitis is soreness and inflammation of your sinuses. Sinuses are hollow spaces in the bones around your face. They are located:  Around your eyes.  In the middle of your forehead.  Behind your nose.  In your cheekbones. Your sinuses and nasal passages are lined with a stringy fluid (mucus). Mucus normally drains out of your sinuses. When your nasal tissues get inflamed or swollen, the mucus can get trapped or blocked so air cannot flow through your sinuses. This lets bacteria, viruses, and funguses grow, and that leads to infection. Follow these instructions at home: Medicines  Take, use, or apply over-the-counter and prescription medicines only as told by your doctor. These may include nasal sprays.  If you were prescribed an antibiotic medicine, take it as told by your doctor. Do not stop taking the antibiotic even if you start to feel better. Hydrate and Humidify  Drink enough water to keep your pee (urine) clear or pale yellow.  Use a cool mist humidifier to keep the humidity level in your home above 50%.  Breathe in steam for 10-15 minutes, 3-4 times a day or as told by your doctor. You can do this in the bathroom while a hot shower is running.  Try not to spend time in cool or dry air. Rest  Rest as much as possible.  Sleep with your head raised (elevated).    Make sure to get enough sleep each night. General instructions  Put a warm, moist washcloth on your face 3-4 times a day or as told by your doctor. This will help with discomfort.  Wash your hands often with soap and water. If there is no soap and water, use hand sanitizer.  Do not smoke. Avoid being around people who are smoking (secondhand smoke).  Keep all follow-up visits as told by your doctor. This is important. Contact a doctor if:  You have a fever.  Your symptoms  get worse.  Your symptoms do not get better within 10 days. Get help right away if:  You have a very bad headache.  You cannot stop throwing up (vomiting).  You have pain or swelling around your face or eyes.  You have trouble seeing.  You feel confused.  Your neck is stiff.  You have trouble breathing. This information is not intended to replace advice given to you by your health care provider. Make sure you discuss any questions you have with your health care provider. Document Released: 08/05/2007 Document Revised: 10/13/2015 Document Reviewed: 12/12/2014 Elsevier Interactive Patient Education  2017 Elsevier Inc.  

## 2016-03-28 NOTE — MAU Note (Signed)
Onset of cough productive green mucus, chest discomfort, positive UPT, was supposed to have a period lst week in November did not unsure of LMP. Thinks has a yeast infection. Took somebody else's antibiotics only 3 pills.

## 2016-03-31 LAB — GC/CHLAMYDIA PROBE AMP (~~LOC~~) NOT AT ARMC
CHLAMYDIA, DNA PROBE: NEGATIVE
Neisseria Gonorrhea: NEGATIVE

## 2016-06-15 DIAGNOSIS — B182 Chronic viral hepatitis C: Secondary | ICD-10-CM | POA: Insufficient documentation

## 2016-06-15 DIAGNOSIS — R894 Abnormal immunological findings in specimens from other organs, systems and tissues: Secondary | ICD-10-CM | POA: Insufficient documentation

## 2016-06-15 DIAGNOSIS — F17200 Nicotine dependence, unspecified, uncomplicated: Secondary | ICD-10-CM | POA: Insufficient documentation

## 2016-06-15 DIAGNOSIS — O34219 Maternal care for unspecified type scar from previous cesarean delivery: Secondary | ICD-10-CM | POA: Insufficient documentation

## 2016-06-15 DIAGNOSIS — B009 Herpesviral infection, unspecified: Secondary | ICD-10-CM | POA: Insufficient documentation

## 2016-06-15 DIAGNOSIS — F3177 Bipolar disorder, in partial remission, most recent episode mixed: Secondary | ICD-10-CM | POA: Insufficient documentation

## 2016-08-19 DIAGNOSIS — O34211 Maternal care for low transverse scar from previous cesarean delivery: Secondary | ICD-10-CM | POA: Insufficient documentation

## 2017-01-04 ENCOUNTER — Encounter (HOSPITAL_COMMUNITY): Payer: Self-pay

## 2018-04-20 IMAGING — CR DG CHEST 2V
2 series · 2 of 2 positions shown · non-contrast
Comparison: None.

CLINICAL DATA: Productive cough.  Chest congestion.

EXAM:
CHEST  2 VIEW

[chest pa]
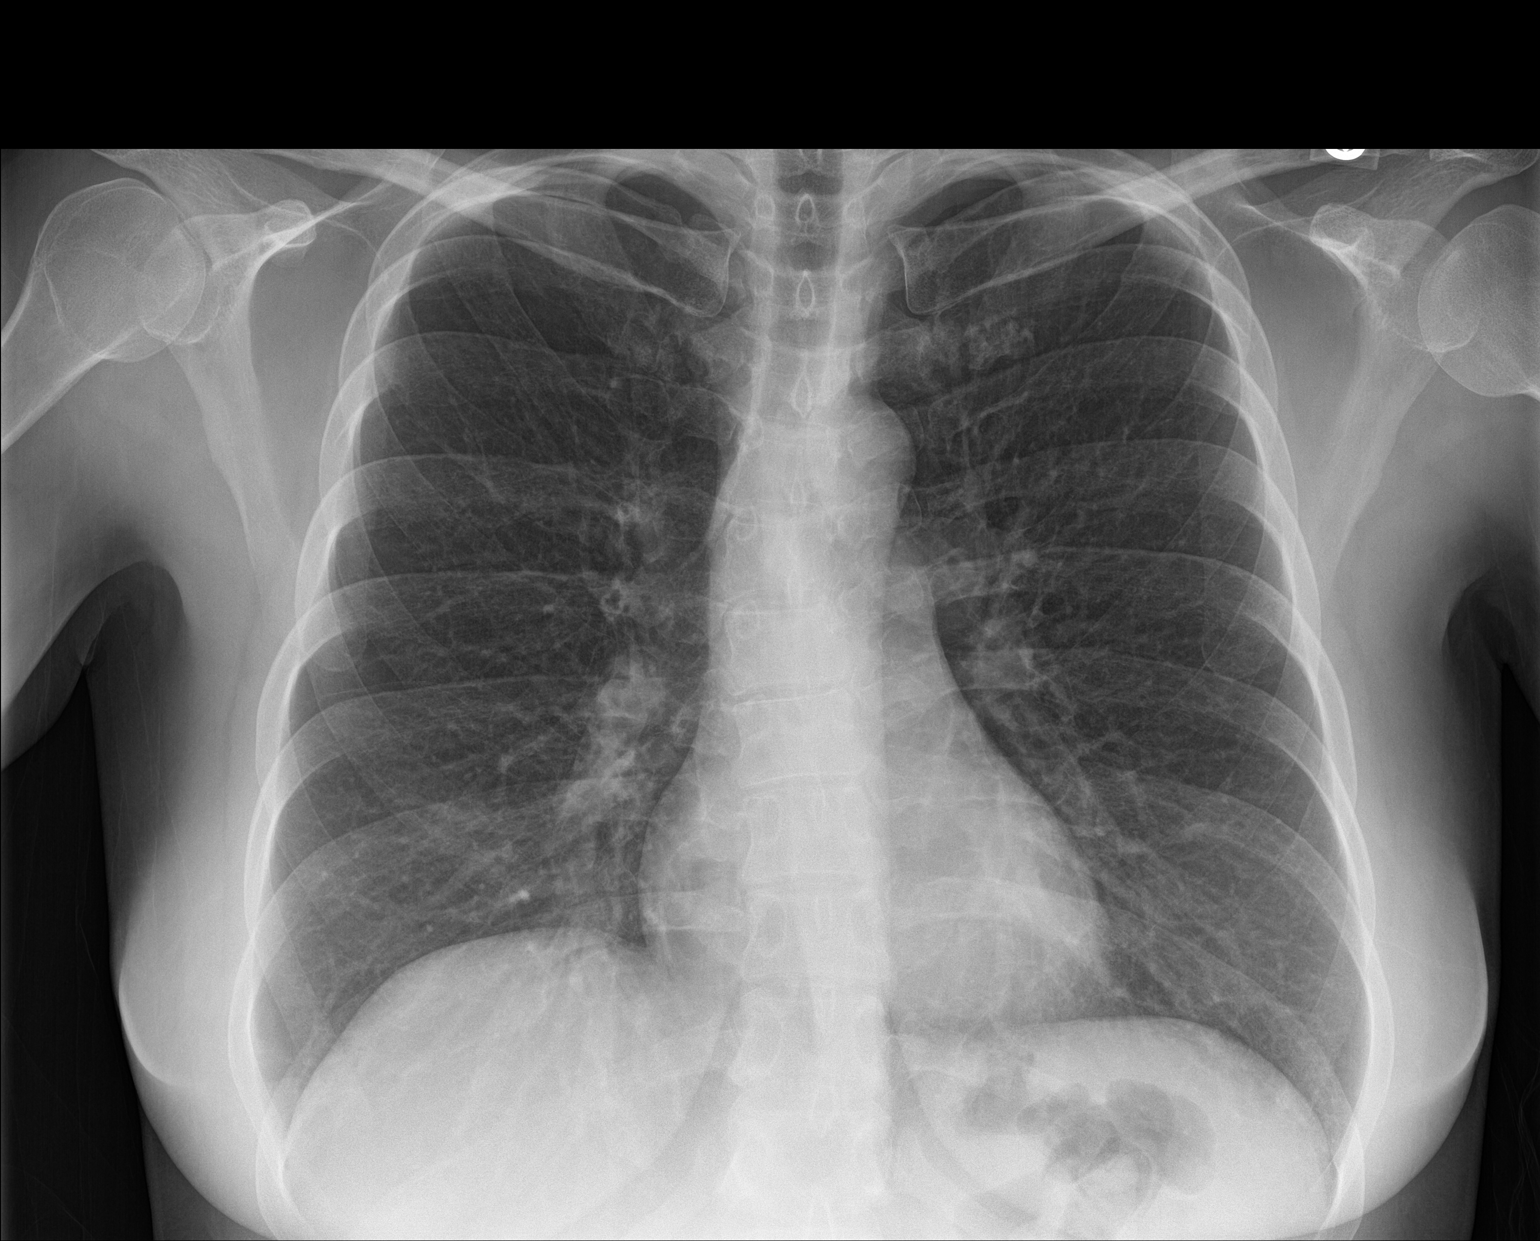

[chest lat]
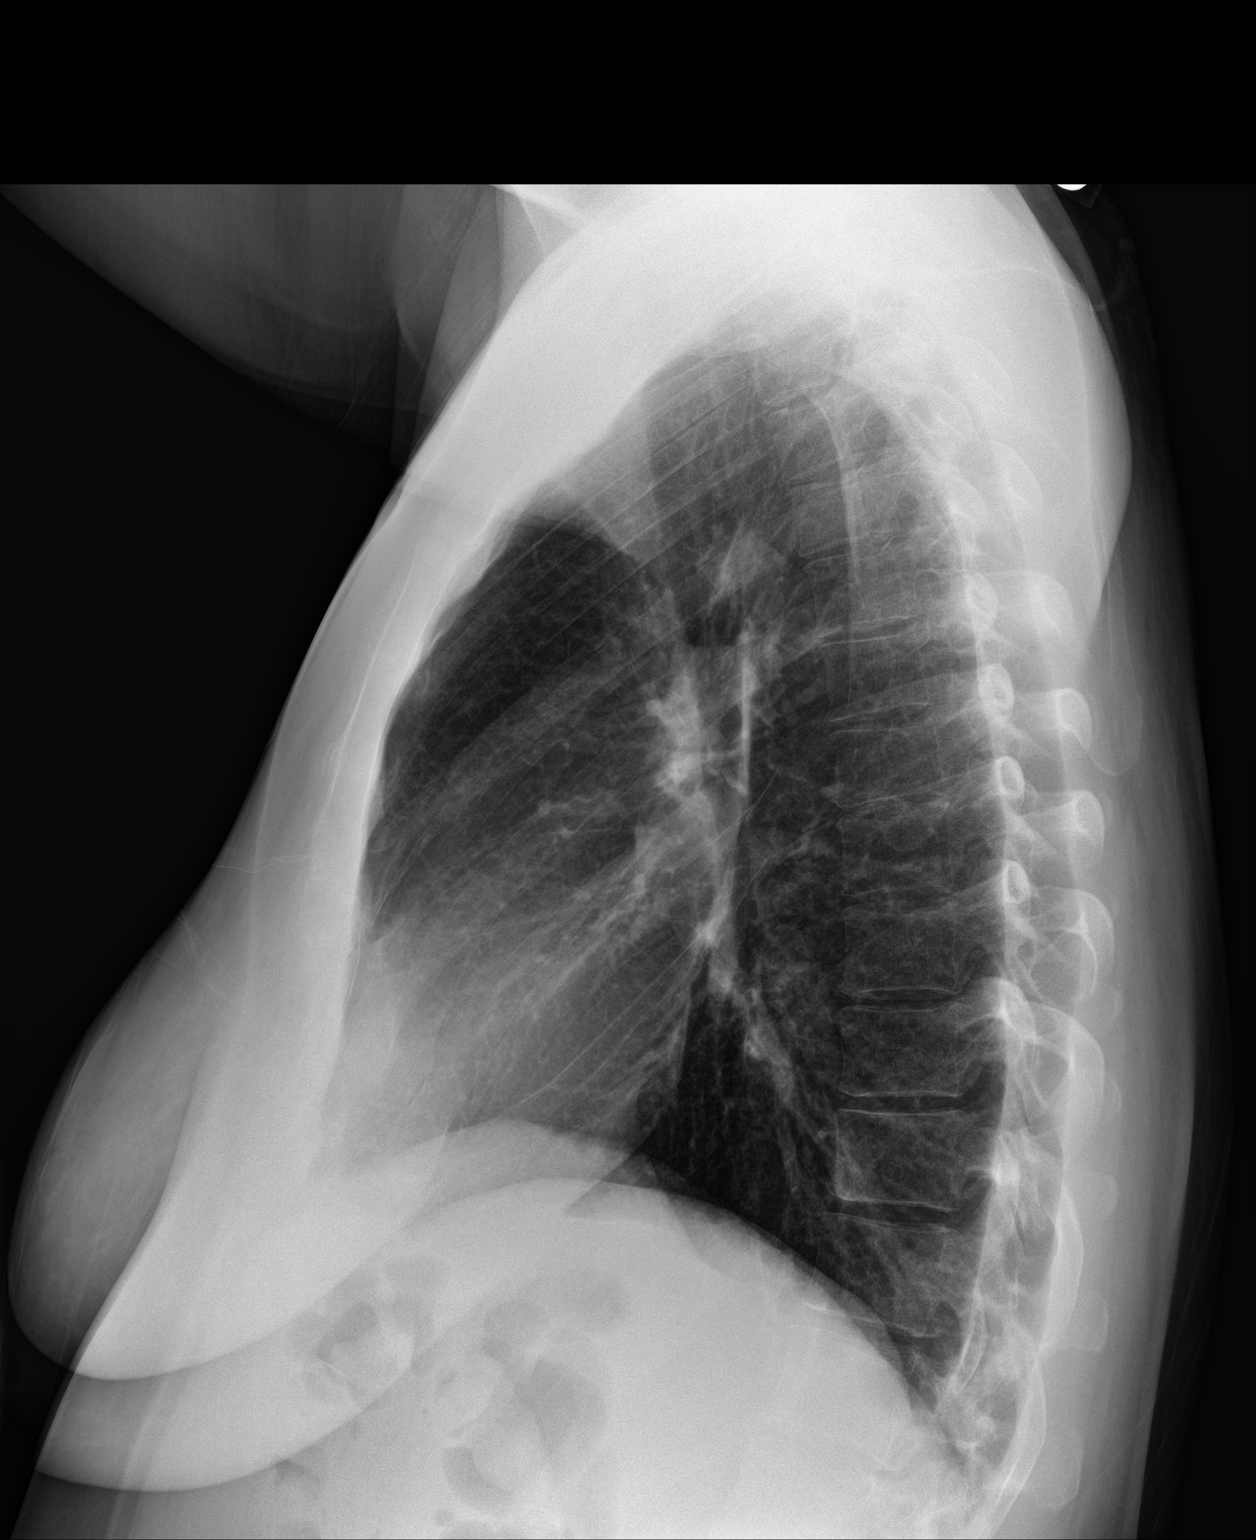

[2 of 2 positions shown; findings below may reference images not displayed]

FINDINGS: Normal heart size and mediastinal contours. No acute infiltrate or
edema. No effusion or pneumothorax. No acute osseous findings.
IMPRESSION: Negative chest.

## 2018-11-15 DIAGNOSIS — O09523 Supervision of elderly multigravida, third trimester: Secondary | ICD-10-CM | POA: Insufficient documentation

## 2018-12-28 DIAGNOSIS — O403XX0 Polyhydramnios, third trimester, not applicable or unspecified: Secondary | ICD-10-CM

## 2018-12-28 DIAGNOSIS — O359XX0 Maternal care for (suspected) fetal abnormality and damage, unspecified, not applicable or unspecified: Secondary | ICD-10-CM | POA: Insufficient documentation

## 2018-12-28 HISTORY — DX: Polyhydramnios, third trimester, not applicable or unspecified: O40.3XX0

## 2019-02-14 DIAGNOSIS — R768 Other specified abnormal immunological findings in serum: Secondary | ICD-10-CM | POA: Insufficient documentation

## 2019-02-14 DIAGNOSIS — O359XX0 Maternal care for (suspected) fetal abnormality and damage, unspecified, not applicable or unspecified: Secondary | ICD-10-CM | POA: Insufficient documentation

## 2019-02-14 HISTORY — DX: Maternal care for (suspected) fetal abnormality and damage, unspecified, not applicable or unspecified: O35.9XX0

## 2019-02-27 DIAGNOSIS — O359XX0 Maternal care for (suspected) fetal abnormality and damage, unspecified, not applicable or unspecified: Secondary | ICD-10-CM | POA: Insufficient documentation

## 2019-10-25 DIAGNOSIS — G8929 Other chronic pain: Secondary | ICD-10-CM | POA: Insufficient documentation

## 2021-02-04 DIAGNOSIS — F1911 Other psychoactive substance abuse, in remission: Secondary | ICD-10-CM | POA: Insufficient documentation

## 2021-02-04 DIAGNOSIS — K219 Gastro-esophageal reflux disease without esophagitis: Secondary | ICD-10-CM | POA: Insufficient documentation

## 2021-04-03 DIAGNOSIS — J449 Chronic obstructive pulmonary disease, unspecified: Secondary | ICD-10-CM | POA: Insufficient documentation

## 2021-07-29 DIAGNOSIS — G4733 Obstructive sleep apnea (adult) (pediatric): Secondary | ICD-10-CM | POA: Insufficient documentation

## 2021-11-19 DIAGNOSIS — G2581 Restless legs syndrome: Secondary | ICD-10-CM | POA: Insufficient documentation

## 2022-08-18 ENCOUNTER — Ambulatory Visit
Admission: EM | Admit: 2022-08-18 | Discharge: 2022-08-18 | Disposition: A | Discharge status: Home / Self Care | Payer: BC Managed Care – PPO | Attending: Physician Assistant | Admitting: Physician Assistant

## 2022-08-18 ENCOUNTER — Other Ambulatory Visit: Payer: Self-pay

## 2022-08-18 ENCOUNTER — Encounter: Payer: Self-pay | Admitting: Emergency Medicine

## 2022-08-18 DIAGNOSIS — Z202 Contact with and (suspected) exposure to infections with a predominantly sexual mode of transmission: Secondary | ICD-10-CM | POA: Insufficient documentation

## 2022-08-18 DIAGNOSIS — N760 Acute vaginitis: Secondary | ICD-10-CM | POA: Insufficient documentation

## 2022-08-18 DIAGNOSIS — Z113 Encounter for screening for infections with a predominantly sexual mode of transmission: Secondary | ICD-10-CM | POA: Insufficient documentation

## 2022-08-18 DIAGNOSIS — B9689 Other specified bacterial agents as the cause of diseases classified elsewhere: Secondary | ICD-10-CM | POA: Insufficient documentation

## 2022-08-18 LAB — POCT URINALYSIS DIP (MANUAL ENTRY)
Blood, UA: NEGATIVE
Glucose, UA: NEGATIVE mg/dL
Ketones, POC UA: NEGATIVE mg/dL
Leukocytes, UA: NEGATIVE
Nitrite, UA: POSITIVE — AB
Protein Ur, POC: NEGATIVE mg/dL
Spec Grav, UA: >=1.03 — AB (ref 1.010–1.025)
Urobilinogen, UA: 1 E.U./dL
pH, UA: 5.5 (ref 5.0–8.0)

## 2022-08-18 LAB — POCT URINE PREGNANCY: Preg Test, Ur: NEGATIVE

## 2022-08-18 MED ORDER — FLUCONAZOLE 150 MG PO TABS: 150.0000 mg | ORAL_TABLET | Freq: Every day | ORAL | 1 refills | Status: DC

## 2022-08-18 MED ORDER — METRONIDAZOLE 500 MG PO TABS: 500.0000 mg | ORAL_TABLET | Freq: Two times a day (BID) | ORAL | 0 refills | Status: AC

## 2022-08-18 MED ORDER — METRONIDAZOLE 1 % EX GEL: Freq: Every day | CUTANEOUS | 0 refills | Status: DC

## 2022-08-18 NOTE — Discharge Instructions (Addendum)
Return if any problems.

## 2022-08-18 NOTE — ED Provider Notes (Signed)
[facility]    CSN: [id number] Arrival date & time: 08/18/22  0857      History   Chief Complaint Chief Complaint  Patient presents with   Exposure to STD    HPI Kathy Burns is a 37 y.o. female.   Patient is requesting testing for STD.  Patient reports she feels like she has bacterial vaginitis.  Patient has a history of bacterial vaginitis and yeast infections.  The history is provided by the patient. No language interpreter was used.  Exposure to STD This is a new problem.    Past Medical History:  Diagnosis Date   Bipolar 1 disorder (HCC)    Hepatitis    Hep C    Patient Active Problem List   Diagnosis Date Noted   Heroin abuse (HCC) 02/16/2013   Cellulitis 02/15/2013   Polysubstance abuse (HCC) 02/15/2013    Past Surgical History:  Procedure Laterality Date   c section x 2      CESAREAN SECTION     GASTRIC BYPASS     TEMPOROMANDIBULAR JOINT ARTHROPLASTY     TMJ ARTHROPLASTY      OB History     Gravida  3   Para  2   Term  2   Preterm      AB      Living  2      SAB      IAB      Ectopic      Multiple      Live Births  2            Home Medications    Prior to Admission medications   Medication Sig Start Date End Date Taking? Authorizing Provider  fluconazole (DIFLUCAN) 150 MG tablet Take 1 tablet (150 mg total) by mouth daily. 08/18/22  Yes Cheron Schaumann K, [name]  metroNIDAZOLE (FLAGYL) 500 MG tablet Take 1 tablet (500 mg total) by mouth 2 (two) times daily for 7 days. 08/18/22 08/25/22 Yes Cheron Schaumann K, [name]  metroNIDAZOLE (METROGEL) 1 % gel Apply topically daily. 08/18/22  Yes Elson Areas, [name]  acetaminophen (TYLENOL) 325 MG tablet Take 650 mg by mouth every 6 (six) hours as needed for moderate pain.    [provider]  albuterol (PROVENTIL HFA;VENTOLIN HFA) 108 (90 Base) MCG/ACT inhaler Inhale 1-2 puffs into the lungs every 6 (six) hours as needed for wheezing or shortness of breath. 03/28/16    Rasch, Victorino Dike I, NP  amoxicillin-clavulanate (AUGMENTIN) 875-125 MG tablet Take 1 tablet by mouth every 12 (twelve) hours. Patient not taking: Reported on 08/18/2022 03/28/16   Rasch, Victorino Dike I, NP  benzonatate (TESSALON) 100 MG capsule Take 1 capsule (100 mg total) by mouth every 8 (eight) hours. Patient not taking: Reported on 08/18/2022 03/28/16   Rasch, Victorino Dike I, NP  cyclobenzaprine (FLEXERIL) 10 MG tablet Take 1 tablet (10 mg total) by mouth 2 (two) times daily as needed for muscle spasms. 04/14/13   Sciacca, Marissa, [name]  ondansetron (ZOFRAN ODT) 4 MG disintegrating tablet 4mg  ODT q4 hours prn nausea/vomit 04/13/13   Toy Cookey, MD    Family History Family History  Problem Relation Age of Onset   Hypertension Other    Diabetes Other    Cancer Other    Stroke Other     Social History Social History   Tobacco Use   Smoking status: Every Day    Types: Cigarettes   Smokeless tobacco: Never  Substance Use Topics  Alcohol use: No   Drug use: Yes    Types: Marijuana, Cocaine, Heroin    Comment: 02/14/2013 last use, heroine last use 11/21/14     Allergies   Broccoli [brassica oleracea] and Codeine   Review of Systems Review of Systems  All other systems reviewed and are negative.    Physical Exam Triage Vital Signs ED Triage Vitals  Enc Vitals Group     BP 08/18/22 0953 129/70     Pulse Rate 08/18/22 0953 (!) 56     Resp 08/18/22 0953 18     Temp 08/18/22 0953 98 F (36.7 C)     Temp Source 08/18/22 0953 Oral     SpO2 08/18/22 0953 98 %     Weight --      Height --      Head Circumference --      Peak Flow --      Pain Score 08/18/22 0954 2     Pain Loc --      Pain Edu? --      Excl. in GC? --    No data found.  Updated Vital Signs BP 129/70 (BP Location: Left Arm)   Pulse (!) 56   Temp 98 F (36.7 C) (Oral)   Resp 18   SpO2 98%   Visual Acuity Right Eye Distance:   Left Eye Distance:   Bilateral Distance:    Right Eye Near:   Left  Eye Near:    Bilateral Near:     Physical Exam Vitals and nursing note reviewed.  Constitutional:      Appearance: She is well-developed.  HENT:     Head: Normocephalic.  Cardiovascular:     Rate and Rhythm: Normal rate.  Pulmonary:     Effort: Pulmonary effort is normal.  Abdominal:     General: There is no distension.  Musculoskeletal:        General: Normal range of motion.     Cervical back: Normal range of motion.  Skin:    General: Skin is warm.  Neurological:     General: No focal deficit present.     Mental Status: She is alert and oriented to person, place, and time.  Psychiatric:        Mood and Affect: Mood normal.      UC Treatments / Results  Labs (all labs ordered are listed, but only abnormal results are displayed) Labs Reviewed  POCT URINALYSIS DIP (MANUAL ENTRY) - Abnormal; Notable for the following components:      Result Value   Color, UA orange (*)    Bilirubin, UA small (*)    Spec Grav, UA >=1.030 (*)    Nitrite, UA Positive (*)    All other components within normal limits  POCT URINE PREGNANCY  CERVICOVAGINAL ANCILLARY ONLY    EKG   Radiology No results found.  Procedures Procedures (including critical care time)  Medications Ordered in UC Medications - No data to display  Initial Impression / Assessment and Plan / UC Course  I have reviewed the triage vital signs and the nursing notes.  Pertinent labs & imaging results that were available during my care of the patient were reviewed by me and considered in my medical decision making (see chart for details).      Final Clinical Impressions(s) / UC Diagnoses   Final diagnoses:  Bacterial vaginitis     Discharge Instructions      Return if any problems.    ED  Prescriptions     Medication Sig Dispense Auth. Provider   metroNIDAZOLE (METROGEL) 1 % gel Apply topically daily. 45 g Isidro Monks K, [name]   metroNIDAZOLE (FLAGYL) 500 MG tablet Take 1 tablet (500 mg total)  by mouth 2 (two) times daily for 7 days. 14 tablet Kennady Zimmerle K, [place]   fluconazole (DIFLUCAN) 150 MG tablet Take 1 tablet (150 mg total) by mouth daily. 1 tablet Elson Areas, [place]      PDMP not reviewed this encounter. Advise testing pending An After Visit Summary was printed and given to the patient.       Elson Areas, [place] 08/18/22 1413

## 2022-08-18 NOTE — ED Triage Notes (Signed)
Pt requesting STD screening and sts sx of BV which she has had in past; pt sts some dysuria but sts is chronic and takes azo

## 2022-08-19 LAB — CERVICOVAGINAL ANCILLARY ONLY
Bacterial Vaginitis (gardnerella): POSITIVE — AB
Candida Glabrata: NEGATIVE
Candida Vaginitis: NEGATIVE
Chlamydia: NEGATIVE
Comment: NEGATIVE
Comment: NEGATIVE
Comment: NEGATIVE
Comment: NEGATIVE
Comment: NEGATIVE
Comment: NORMAL
Neisseria Gonorrhea: NEGATIVE
Trichomonas: NEGATIVE

## 2022-09-10 ENCOUNTER — Ambulatory Visit
Admission: RE | Admit: 2022-09-10 | Discharge: 2022-09-10 | Disposition: A | Discharge status: Home / Self Care | Payer: BC Managed Care – PPO | Source: Ambulatory Visit | Attending: Family Medicine | Admitting: Family Medicine

## 2022-09-10 VITALS — BP 109/77 | HR 63 | Temp 97.9°F | Resp 17

## 2022-09-10 DIAGNOSIS — N3000 Acute cystitis without hematuria: Secondary | ICD-10-CM

## 2022-09-10 DIAGNOSIS — N760 Acute vaginitis: Secondary | ICD-10-CM | POA: Insufficient documentation

## 2022-09-10 LAB — POCT URINALYSIS DIP (MANUAL ENTRY)
Bilirubin, UA: NEGATIVE
Blood, UA: NEGATIVE
Glucose, UA: NEGATIVE mg/dL
Ketones, POC UA: NEGATIVE mg/dL
Nitrite, UA: NEGATIVE
Protein Ur, POC: NEGATIVE mg/dL
Spec Grav, UA: 1.025 (ref 1.010–1.025)
Urobilinogen, UA: 2 E.U./dL — AB
pH, UA: 6 (ref 5.0–8.0)

## 2022-09-10 MED ORDER — PHENAZOPYRIDINE HCL 200 MG PO TABS: 200.0000 mg | ORAL_TABLET | Freq: Three times a day (TID) | ORAL | 0 refills | Status: DC

## 2022-09-10 MED ORDER — NITROFURANTOIN MONOHYD MACRO 100 MG PO CAPS: 100.0000 mg | ORAL_CAPSULE | Freq: Two times a day (BID) | ORAL | 0 refills | Status: DC

## 2022-09-10 NOTE — ED Triage Notes (Signed)
Pt presents with c/o possible vaginosis. States she has burning when voiding. Denies d/c. States she thinks she still has vaginosis.

## 2022-09-10 NOTE — Discharge Instructions (Addendum)
Urine culture and vaginal cytology pending. Our office will call you if any additional treatment is warranted based on your results.  Start Macrobid 100 mg twice daily for 5 days this is to treat suspected urinary tract infection.  Have also prescribed you Pyridium for pain with urination.

## 2022-09-10 NOTE — ED Provider Notes (Signed)
[facility]    CSN: [id number] Arrival date & time: 09/10/22  8469      History   Chief Complaint Chief Complaint  Patient presents with   Urinary Frequency    Entered by patient    HPI Kathy Burns is a 38 y.o. female.   HPI With a history of bacterial vaginosis, resents today with urinary frequency times a few days.  She reports burning with each void.  She was treated serial vaginosis back in June and she concerned she may have be again.  However she denies vaginal discharge.  She is afebrile and denies flank pain. Endorses urgency and frequency.  She has mention several times during encounter that if she is positive for bacterial vaginosis that she only wants to be treated with a vaginal cream as she wants to be able to drink alcohol which is contraindicated with metronidazole. Past Medical History:  Diagnosis Date   Bipolar 1 disorder (HCC)    Hepatitis    Hep C    Patient Active Problem List   Diagnosis Date Noted   Heroin abuse (HCC) 02/16/2013   Cellulitis 02/15/2013   Polysubstance abuse (HCC) 02/15/2013    Past Surgical History:  Procedure Laterality Date   c section x 2      CESAREAN SECTION     GASTRIC BYPASS     TEMPOROMANDIBULAR JOINT ARTHROPLASTY     TMJ ARTHROPLASTY      OB History     Gravida  3   Para  2   Term  2   Preterm      AB      Living  2      SAB      IAB      Ectopic      Multiple      Live Births  2            Home Medications    Prior to Admission medications   Medication Sig Start Date End Date Taking? Authorizing Provider  nitrofurantoin, macrocrystal-monohydrate, (MACROBID) 100 MG capsule Take 1 capsule (100 mg total) by mouth 2 (two) times daily for 5 days. 09/10/22 09/15/22 Yes Bing Neighbors, NP  phenazopyridine (PYRIDIUM) 200 MG tablet Take 1 tablet (200 mg total) by mouth 3 (three) times daily. 09/10/22  Yes Bing Neighbors, NP  acetaminophen (TYLENOL) 325 MG tablet Take 650 mg  by mouth every 6 (six) hours as needed for moderate pain.    [provider]  albuterol (PROVENTIL HFA;VENTOLIN HFA) 108 (90 Base) MCG/ACT inhaler Inhale 1-2 puffs into the lungs every 6 (six) hours as needed for wheezing or shortness of breath. 03/28/16   Rasch, Victorino Dike I, NP  amoxicillin-clavulanate (AUGMENTIN) 875-125 MG tablet Take 1 tablet by mouth every 12 (twelve) hours. Patient not taking: Reported on 08/18/2022 03/28/16   Rasch, Victorino Dike I, NP  benzonatate (TESSALON) 100 MG capsule Take 1 capsule (100 mg total) by mouth every 8 (eight) hours. Patient not taking: Reported on 08/18/2022 03/28/16   Rasch, Victorino Dike I, NP  cyclobenzaprine (FLEXERIL) 10 MG tablet Take 1 tablet (10 mg total) by mouth 2 (two) times daily as needed for muscle spasms. 04/14/13   Sciacca, Marissa, [name]  fluconazole (DIFLUCAN) 150 MG tablet Take 1 tablet (150 mg total) by mouth daily. 08/18/22   Elson Areas, [name]  metroNIDAZOLE (METROGEL) 1 % gel Apply topically daily. 08/18/22   Elson Areas, [name]  ondansetron (ZOFRAN ODT) 4 MG disintegrating tablet  4mg  ODT q4 hours prn nausea/vomit 04/13/13   Toy Cookey, MD    Family History Family History  Problem Relation Age of Onset   Hypertension Other    Diabetes Other    Cancer Other    Stroke Other     Social History Social History   Tobacco Use   Smoking status: Every Day    Types: Cigarettes   Smokeless tobacco: Never  Substance Use Topics   Alcohol use: No   Drug use: Yes    Types: Marijuana, Cocaine, Heroin    Comment: 02/14/2013 last use, heroine last use 11/21/14     Allergies   Broccoli [brassica oleracea] and Codeine   Review of Systems Review of Systems Pertinent negatives listed in HPI   Physical Exam Triage Vital Signs ED Triage Vitals  Encounter Vitals Group     BP 09/10/22 1018 109/77     Systolic BP Percentile --      Diastolic BP Percentile --      Pulse Rate 09/10/22 1018 63     Resp 09/10/22 1018 17     Temp  09/10/22 1018 97.9 F (36.6 C)     Temp Source 09/10/22 1018 Oral     SpO2 09/10/22 1018 96 %     Weight --      Height --      Head Circumference --      Peak Flow --      Pain Score 09/10/22 1017 0     Pain Loc --      Pain Education --      Exclude from Growth Chart --    No data found.  Updated Vital Signs BP 109/77 (BP Location: Left Arm)   Pulse 63   Temp 97.9 F (36.6 C) (Oral)   Resp 17   LMP 09/03/2022 (Exact Date)   SpO2 96%   Visual Acuity Right Eye Distance:   Left Eye Distance:   Bilateral Distance:    Right Eye Near:   Left Eye Near:    Bilateral Near:     Physical Exam Vitals reviewed.  Constitutional:      Appearance: Normal appearance.  HENT:     Head: Normocephalic and atraumatic.  Eyes:     Extraocular Movements: Extraocular movements intact.     Conjunctiva/sclera: Conjunctivae normal.     Pupils: Pupils are equal, round, and reactive to light.  Cardiovascular:     Rate and Rhythm: Normal rate and regular rhythm.  Pulmonary:     Effort: Pulmonary effort is normal.     Breath sounds: Normal breath sounds.  Musculoskeletal:        General: Normal range of motion.  Neurological:     General: No focal deficit present.     Mental Status: She is alert.    UC Treatments / Results  Labs (all labs ordered are listed, but only abnormal results are displayed) Labs Reviewed  POCT URINALYSIS DIP (MANUAL ENTRY) - Abnormal; Notable for the following components:      Result Value   Clarity, UA cloudy (*)    Urobilinogen, UA 2.0 (*)    Leukocytes, UA Trace (*)    All other components within normal limits  URINE CULTURE  CERVICOVAGINAL ANCILLARY ONLY    EKG   Radiology No results found.  Procedures Procedures (including critical care time)  Medications Ordered in UC Medications - No data to display  Initial Impression / Assessment and Plan / UC Course  I have  reviewed the triage vital signs and the nursing notes.  Pertinent labs &  imaging results that were available during my care of the patient were reviewed by me and considered in my medical decision making (see chart for details).    A concern for possible UTI given patient's symptoms of urgency frequency and burning in the absence of vaginal discharge will treat empirically with Macrobid twice daily for 5 days while awaiting urine culture.  Also prescribed Pyridium 3 times daily as needed for pain associated with urination.  Vaginal cytology obtained to rule out secondary cause of symptoms.  Advised that if any of her results are abnormal we will contact her by phone or via MyChart only if treatment is warranted based on lab results.  Patient verbalized understanding and agreement with plan today. Final Clinical Impressions(s) / UC Diagnoses   Final diagnoses:  Acute cystitis without hematuria  Vaginitis and vulvovaginitis     Discharge Instructions      Urine culture and vaginal cytology pending. Our office will call you if any additional treatment is warranted based on your results.  Start Macrobid 100 mg twice daily for 5 days this is to treat suspected urinary tract infection.  Have also prescribed you Pyridium for pain with urination.     ED Prescriptions     Medication Sig Dispense Auth. Provider   nitrofurantoin, macrocrystal-monohydrate, (MACROBID) 100 MG capsule Take 1 capsule (100 mg total) by mouth 2 (two) times daily for 5 days. 10 capsule Bing Neighbors, NP   phenazopyridine (PYRIDIUM) 200 MG tablet Take 1 tablet (200 mg total) by mouth 3 (three) times daily. 10 tablet Bing Neighbors, NP      PDMP not reviewed this encounter.   Bing Neighbors, NP 09/10/22 1216

## 2022-09-11 LAB — CERVICOVAGINAL ANCILLARY ONLY
Bacterial Vaginitis (gardnerella): NEGATIVE
Candida Glabrata: NEGATIVE
Candida Vaginitis: NEGATIVE
Chlamydia: NEGATIVE
Comment: NEGATIVE
Comment: NEGATIVE
Comment: NEGATIVE
Comment: NEGATIVE
Comment: NEGATIVE
Comment: NORMAL
Neisseria Gonorrhea: NEGATIVE
Trichomonas: NEGATIVE

## 2022-09-11 LAB — URINE CULTURE: Special Requests: NORMAL

## 2022-09-12 ENCOUNTER — Ambulatory Visit
Admission: EM | Admit: 2022-09-12 | Discharge: 2022-09-12 | Disposition: A | Discharge status: Home / Self Care | Payer: BC Managed Care – PPO | Attending: Internal Medicine | Admitting: Internal Medicine

## 2022-09-12 DIAGNOSIS — N3001 Acute cystitis with hematuria: Secondary | ICD-10-CM | POA: Insufficient documentation

## 2022-09-12 DIAGNOSIS — R300 Dysuria: Secondary | ICD-10-CM | POA: Insufficient documentation

## 2022-09-12 DIAGNOSIS — N898 Other specified noninflammatory disorders of vagina: Secondary | ICD-10-CM | POA: Insufficient documentation

## 2022-09-12 LAB — URINE CULTURE: Culture: >=100000 — AB

## 2022-09-12 LAB — POCT URINALYSIS DIP (MANUAL ENTRY)
Bilirubin, UA: NEGATIVE
Glucose, UA: 100 mg/dL — AB
Leukocytes, UA: NEGATIVE
Nitrite, UA: POSITIVE — AB
Spec Grav, UA: 1.02 (ref 1.010–1.025)
Urobilinogen, UA: 2 E.U./dL — AB
pH, UA: 5 (ref 5.0–8.0)

## 2022-09-12 LAB — POCT FASTING CBG KUC MANUAL ENTRY: POCT Glucose (KUC): 92 mg/dL (ref 70–99)

## 2022-09-12 MED ORDER — CIPROFLOXACIN HCL 500 MG PO TABS: 500.0000 mg | ORAL_TABLET | Freq: Two times a day (BID) | ORAL | 0 refills | Status: DC

## 2022-09-12 MED ORDER — CLOTRIMAZOLE 1 % VA CREA: 1.0000 | TOPICAL_CREAM | Freq: Every day | VAGINAL | 0 refills | Status: AC

## 2022-09-12 NOTE — ED Triage Notes (Signed)
Pt states she has a UTI and the antibiotics is she is on is not helping.

## 2022-09-12 NOTE — ED Provider Notes (Signed)
[facility]    CSN: [id number] Arrival date & time: 09/12/22  1140      History   Chief Complaint Chief Complaint  Patient presents with   Dysuria    HPI Kathy Burns is a 37 y.o. female.   Patient presents with persistent dysuria and urinary frequency.  Patient is concerned the antibiotic that she was prescribed at visit on 7/11 is not working.  Patient was prescribed Macrobid and has been taking as prescribed with minimal improvement.  Reports that she developed new vaginal itching as well without vaginal discharge and is concerned that antibiotic is causing yeast infection.  Patient has no concerns for STDs.  Last menstrual cycle was 09/03/2022.   Dysuria   Past Medical History:  Diagnosis Date   Bipolar 1 disorder (HCC)    Hepatitis    Hep C    Patient Active Problem List   Diagnosis Date Noted   Heroin abuse (HCC) 02/16/2013   Cellulitis 02/15/2013   Polysubstance abuse (HCC) 02/15/2013    Past Surgical History:  Procedure Laterality Date   c section x 2      CESAREAN SECTION     GASTRIC BYPASS     TEMPOROMANDIBULAR JOINT ARTHROPLASTY     TMJ ARTHROPLASTY      OB History     Gravida  3   Para  2   Term  2   Preterm      AB      Living  2      SAB      IAB      Ectopic      Multiple      Live Births  2            Home Medications    Prior to Admission medications   Medication Sig Start Date End Date Taking? Authorizing Provider  ciprofloxacin (CIPRO) 500 MG tablet Take 1 tablet (500 mg total) by mouth every 12 (twelve) hours. 09/12/22  Yes Glennie Bose, Rolly Salter E, FNP  clotrimazole (GYNE-LOTRIMIN) 1 % vaginal cream Place 1 Applicatorful vaginally at bedtime for 7 days. 09/12/22 09/19/22 Yes Azion Centrella, Acie Fredrickson, FNP  acetaminophen (TYLENOL) 325 MG tablet Take 650 mg by mouth every 6 (six) hours as needed for moderate pain.    [provider]  albuterol (PROVENTIL HFA;VENTOLIN HFA) 108 (90 Base) MCG/ACT inhaler Inhale 1-2  puffs into the lungs every 6 (six) hours as needed for wheezing or shortness of breath. 03/28/16   Rasch, Victorino Dike I, NP  benzonatate (TESSALON) 100 MG capsule Take 1 capsule (100 mg total) by mouth every 8 (eight) hours. Patient not taking: Reported on 08/18/2022 03/28/16   Rasch, Victorino Dike I, NP  cyclobenzaprine (FLEXERIL) 10 MG tablet Take 1 tablet (10 mg total) by mouth 2 (two) times daily as needed for muscle spasms. 04/14/13   Sciacca, Marissa, [name]  fluconazole (DIFLUCAN) 150 MG tablet Take 1 tablet (150 mg total) by mouth daily. 08/18/22   Elson Areas, [name]  metroNIDAZOLE (METROGEL) 1 % gel Apply topically daily. 08/18/22   Elson Areas, [name]  ondansetron (ZOFRAN ODT) 4 MG disintegrating tablet 4mg  ODT q4 hours prn nausea/vomit 04/13/13   Toy Cookey, MD  phenazopyridine (PYRIDIUM) 200 MG tablet Take 1 tablet (200 mg total) by mouth 3 (three) times daily. 09/10/22   Bing Neighbors, NP    Family History Family History  Problem Relation Age of Onset   Hypertension Other    Diabetes Other  Cancer Other    Stroke Other     Social History Social History   Tobacco Use   Smoking status: Every Day    Types: Cigarettes   Smokeless tobacco: Never  Substance Use Topics   Alcohol use: No   Drug use: Yes    Types: Marijuana, Cocaine, Heroin    Comment: 02/14/2013 last use, heroine last use 11/21/14     Allergies   Broccoli [brassica oleracea] and Codeine   Review of Systems Review of Systems Per HPI  Physical Exam Triage Vital Signs ED Triage Vitals  Encounter Vitals Group     BP 09/12/22 1156 120/64     Systolic BP Percentile --      Diastolic BP Percentile --      Pulse Rate 09/12/22 1156 (!) 55     Resp 09/12/22 1156 16     Temp 09/12/22 1156 98 F (36.7 C)     Temp Source 09/12/22 1156 Oral     SpO2 09/12/22 1156 97 %     Weight --      Height --      Head Circumference --      Peak Flow --      Pain Score 09/12/22 1155 7     Pain Loc --      Pain  Education --      Exclude from Growth Chart --    No data found.  Updated Vital Signs BP 120/64 (BP Location: Right Arm)   Pulse (!) 55   Temp 98 F (36.7 C) (Oral)   Resp 16   LMP 09/03/2022 (Exact Date)   SpO2 97%   Visual Acuity Right Eye Distance:   Left Eye Distance:   Bilateral Distance:    Right Eye Near:   Left Eye Near:    Bilateral Near:     Physical Exam Constitutional:      General: She is not in acute distress.    Appearance: Normal appearance. She is not toxic-appearing or diaphoretic.  HENT:     Head: Normocephalic and atraumatic.  Eyes:     Extraocular Movements: Extraocular movements intact.     Conjunctiva/sclera: Conjunctivae normal.  Pulmonary:     Effort: Pulmonary effort is normal.  Genitourinary:    Comments: Deferred with shared decision making. Self swab performed.  Neurological:     General: No focal deficit present.     Mental Status: She is alert and oriented to person, place, and time. Mental status is at baseline.  Psychiatric:        Mood and Affect: Mood normal.        Behavior: Behavior normal.        Thought Content: Thought content normal.        Judgment: Judgment normal.      UC Treatments / Results  Labs (all labs ordered are listed, but only abnormal results are displayed) Labs Reviewed  POCT URINALYSIS DIP (MANUAL ENTRY) - Abnormal; Notable for the following components:      Result Value   Color, UA orange (*)    Glucose, UA =100 (*)    Ketones, POC UA trace (5) (*)    Blood, UA trace-intact (*)    Protein Ur, POC trace (*)    Urobilinogen, UA 2.0 (*)    Nitrite, UA Positive (*)    All other components within normal limits  URINE CULTURE  POCT FASTING CBG K[facility] MANUAL ENTRY  CERVICOVAGINAL ANCILLARY ONLY    EKG  Radiology No results found.  Procedures Procedures (including critical care time)  Medications Ordered in UC Medications - No data to display  Initial Impression / Assessment and Plan / UC  Course  I have reviewed the triage vital signs and the nursing notes.  Pertinent labs & imaging results that were available during my care of the patient were reviewed by me and considered in my medical decision making (see chart for details).     Patient's most recent urine culture on 7/11 revealed that Macrobid was not best antibiotic for bacteria.  Will have patient stop Macrobid and start Cipro.  After further review of cardiology notes, it appears that Cipro should be safe as there is no QT prolongation on patient's most recent EKG in March.  Patient requesting medication for yeast infection so will prescribe topical medication instead of Diflucan given it can interact with Cipro.  Vaginal swab pending to confirm this.  Patient does not have any concern for STD so will test for BV and yeast. Urine culture pending again as well.  Patient advised to follow-up if any symptoms persist or worsen.  Patient verbalized understanding and was agreeable with plan.  Final Clinical Impressions(s) / UC Diagnoses   Final diagnoses:  Acute cystitis with hematuria  Dysuria  Vaginal itching     Discharge Instructions      Stop Macrobid and start Cipro.  Avoid strenuous activity or exercise while taking antibiotic.  Ensure adequate fluid hydration as well.    ED Prescriptions     Medication Sig Dispense Auth. Provider   ciprofloxacin (CIPRO) 500 MG tablet Take 1 tablet (500 mg total) by mouth every 12 (twelve) hours. 10 tablet [place], [place] E, [place]   clotrimazole (GYNE-LOTRIMIN) 1 % vaginal cream Place 1 Applicatorful vaginally at bedtime for 7 days. 45 g Gustavus Bryant, [place]      PDMP not reviewed this encounter.   Gustavus Bryant, [place] 09/12/22 1253

## 2022-09-12 NOTE — Discharge Instructions (Signed)
Stop Macrobid and start Cipro.  Avoid strenuous activity or exercise while taking antibiotic.  Ensure adequate fluid hydration as well.

## 2022-09-14 ENCOUNTER — Telehealth (HOSPITAL_COMMUNITY): Payer: Self-pay | Admitting: Emergency Medicine

## 2022-09-14 LAB — CERVICOVAGINAL ANCILLARY ONLY
Bacterial Vaginitis (gardnerella): NEGATIVE
Candida Glabrata: NEGATIVE
Candida Vaginitis: NEGATIVE
Comment: NEGATIVE
Comment: NEGATIVE
Comment: NEGATIVE

## 2022-09-14 LAB — URINE CULTURE

## 2022-09-14 MED ORDER — CEPHALEXIN 500 MG PO CAPS: 500.0000 mg | ORAL_CAPSULE | Freq: Two times a day (BID) | ORAL | 0 refills | Status: AC

## 2022-09-15 LAB — URINE CULTURE

## 2022-10-22 ENCOUNTER — Encounter: Payer: Self-pay | Admitting: Family

## 2022-10-22 ENCOUNTER — Ambulatory Visit (INDEPENDENT_AMBULATORY_CARE_PROVIDER_SITE_OTHER): Payer: BC Managed Care – PPO | Admitting: Family

## 2022-10-22 VITALS — BP 111/77 | HR 80 | Temp 98.1°F | Resp 16 | Ht 65.0 in | Wt 186.8 lb

## 2022-10-22 DIAGNOSIS — K760 Fatty (change of) liver, not elsewhere classified: Secondary | ICD-10-CM | POA: Diagnosis not present

## 2022-10-22 DIAGNOSIS — R748 Abnormal levels of other serum enzymes: Secondary | ICD-10-CM

## 2022-10-22 DIAGNOSIS — F1010 Alcohol abuse, uncomplicated: Secondary | ICD-10-CM

## 2022-10-22 DIAGNOSIS — O368990 Maternal care for other specified fetal problems, unspecified trimester, not applicable or unspecified: Secondary | ICD-10-CM | POA: Insufficient documentation

## 2022-10-22 DIAGNOSIS — Z87898 Personal history of other specified conditions: Secondary | ICD-10-CM

## 2022-10-22 DIAGNOSIS — Z3A35 35 weeks gestation of pregnancy: Secondary | ICD-10-CM | POA: Insufficient documentation

## 2022-10-22 DIAGNOSIS — Z515 Encounter for palliative care: Secondary | ICD-10-CM | POA: Insufficient documentation

## 2022-10-22 DIAGNOSIS — Z9884 Bariatric surgery status: Secondary | ICD-10-CM

## 2022-10-22 DIAGNOSIS — Z7689 Persons encountering health services in other specified circumstances: Secondary | ICD-10-CM

## 2022-10-22 HISTORY — DX: 35 weeks gestation of pregnancy: Z3A.35

## 2022-10-22 HISTORY — DX: Maternal care for other specified fetal problems, unspecified trimester, not applicable or unspecified: O36.8990

## 2022-10-22 NOTE — Progress Notes (Signed)
Subjective:    Kathy Burns - 37 y.o. female MRN [id number]  Date of birth: [birth year]  HPI  Kathy Burns is to establish care. Reports she recently moved from [place] to [place].  Current issues and/or concerns: - Reports bariatric surgery September 2023. Since then she has lost 100 pounds. Reports increased skin irritation underneath abdomen. She was followed by a specialist prior to moving to [facility]. - Reports consuming 1 pint of alcohol on most days. States she has not used any "substances" for 6 years. She denies thoughts of self-harm, suicidal ideations, homicidal ideations. - Reports history of fatty liver and elevated liver enzymes. She was followed by a specialist prior to moving to [facility]. - History of precordial chest pain. She does not complain of red flag symptoms such as but not limited to chest pain, shortness of breath, worst headache of life, nausea/vomiting. She was followed by a specialist prior to moving to [facility]. - No further issues/concerns for discussion today.  ROS per HPI    Health Maintenance:  Health Maintenance Due  Topic Date Due   PAP SMEAR-Modifier  Never done   COVID-19 Vaccine (3 - 2023-24 season) 10/31/2021   INFLUENZA VACCINE  10/01/2022     Past Medical History: Patient Active Problem List   Diagnosis Date Noted   [redacted] weeks gestation of pregnancy 10/22/2022   Admission for palliative care 10/22/2022   Airway concern, fetal, affecting maternal care 10/22/2022   RLS (restless legs syndrome) 11/19/2021   OSA (obstructive sleep apnea) 07/29/2021   COPD, moderate (HCC) 04/03/2021   Gastroesophageal reflux disease without esophagitis 02/04/2021   History of substance abuse (HCC) 02/04/2021   Morbid obesity (HCC) 02/04/2021   Chronic RUQ pain 10/25/2019   Postpartum care following cesarean delivery 03/08/2019   Fetal anomaly necessitating delivery 02/27/2019   Hepatitis C antibody positive in blood  02/14/2019   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 02/14/2019   Polyhydramnios affecting pregnancy in third trimester 12/28/2018   Suspected fetal anomaly, antepartum 12/28/2018   AMA (advanced maternal age) multigravida 35+, third trimester 11/15/2018   Encounter for maternal care for low transverse scar from previous cesarean delivery 08/19/2016   Bipolar disorder, in partial remission, most recent episode mixed (HCC) 06/15/2016   Chronic hepatitis C without hepatic coma (HCC) 06/15/2016   Heavy smoker 06/15/2016   Previous cesarean delivery affecting pregnancy 06/15/2016   Herpes simplex antibody positive 06/15/2016   Heroin abuse (HCC) 02/16/2013   Cellulitis 02/15/2013   Polysubstance abuse (HCC) 02/15/2013    Social History   reports that she has been smoking cigarettes. She has never used smokeless tobacco. She reports current drug use. Drugs: Marijuana, Cocaine, and Heroin. She reports that she does not drink alcohol.   Family History  family history includes Cancer in an other family member; Diabetes in an other family member; Hypertension in an other family member; Stroke in an other family member.   Medications: reviewed and updated   Objective:   Physical Exam BP 111/77   Pulse 80   Temp 98.1 F (36.7 C) (Oral)   Resp 16   Ht 5\' 5"  (1.651 m)   Wt 186 lb 12.8 oz (84.7 kg)   SpO2 96%   BMI 31.09 kg/m   Physical Exam HENT:     Head: Normocephalic and atraumatic.     Nose: Nose normal.     Mouth/Throat:     Mouth: Mucous membranes are moist.  Pharynx: Oropharynx is clear.  Eyes:     Extraocular Movements: Extraocular movements intact.     Conjunctiva/sclera: Conjunctivae normal.     Pupils: Pupils are equal, round, and reactive to light.  Cardiovascular:     Rate and Rhythm: Normal rate and regular rhythm.     Pulses: Normal pulses.     Heart sounds: Normal heart sounds.  Pulmonary:     Effort: Pulmonary effort is normal.      Breath sounds: Normal breath sounds.  Musculoskeletal:        General: Normal range of motion.     Cervical back: Normal range of motion and neck supple.  Neurological:     General: No focal deficit present.     Mental Status: She is alert and oriented to person, place, and time.  Psychiatric:        Mood and Affect: Mood normal.        Behavior: Behavior normal.       Assessment & Plan:  1. Encounter to establish care - Patient presents today to establish care. During the interim follow-up with primary provider as scheduled.  - Return for annual physical examination, labs, and health maintenance. Arrive fasting meaning having no food for at least 8 hours prior to appointment. You may have only water or black coffee. Please take scheduled medications as normal.  2. History of bariatric surgery - Referral to Bariatric Surgery for evaluation/management. - Amb Referral to Bariatric Surgery  3. Fatty liver 4. Elevated liver enzymes - Routine screening.  - Referral to Gastroenterology for evaluation/management.  - Hepatic Function Panel - Ambulatory referral to Gastroenterology  5. Alcohol abuse - Patient denies thoughts of self-harm, suicidal ideations, homicidal ideations. - Referral to Psychiatry for evaluation/management. - Ambulatory referral to Psychiatry  6. History of precordial chest pain - Patient today in office with no cardiopulmonary/acute distress.  - Referral to Cardiology for evaluation/management.  - Ambulatory referral to Cardiology    Patient was given clear instructions to go to Emergency Department or return to medical center if symptoms don't improve, worsen, or new problems develop.The patient verbalized understanding.  I discussed the assessment and treatment plan with the patient. The patient was provided an opportunity to ask questions and all were answered. The patient agreed with the plan and demonstrated an understanding of the instructions.   The  patient was advised to call back or seek an in-person evaluation if the symptoms worsen or if the condition fails to improve as anticipated.    Ricky Stabs, NP 10/22/2022, 8:47 AM Primary Care at [facility]

## 2022-10-22 NOTE — Patient Instructions (Signed)
Thank you for choosing [facility] at [facility] for your medical home!    Kathy Burns was seen by Rema Fendt, NP today.   Kathy Burns's primary care provider is Ricky Stabs, NP.   For the best care possible,  you should try to see Ricky Stabs, NP whenever you come to office.   We look forward to seeing you again soon!  If you have any questions about your visit today,  please call us at [phone]  Or feel free to reach your provider via MyChart.   Keeping you healthy   Get these tests Blood pressure- Have your blood pressure checked once a year by your healthcare provider.  Normal blood pressure is 120/80. Weight- Have your body mass index (BMI) calculated to screen for obesity.  BMI is a measure of body fat based on height and weight. You can also calculate your own BMI at https://www.west-esparza.com/. Cholesterol- Have your cholesterol checked regularly starting at age 85, sooner may be necessary if you have diabetes, high blood pressure, if a family member developed heart diseases at an early age or if you smoke.  Chlamydia, HIV, and other sexual transmitted disease- Get screened each year until the age of 87 then within three months of each new sexual partner. Diabetes- Have your blood sugar checked regularly if you have high blood pressure, high cholesterol, a family history of diabetes or if you are overweight.   Get these vaccines Flu shot- Every fall. Tetanus shot- Every 10 years. Menactra- Single dose; prevents meningitis.   Take these steps Don't smoke- If you do smoke, ask your healthcare provider about quitting. For tips on how to quit, go to www.smokefree.gov or call 1-800-QUIT-NOW. Be physically active- Exercise 5 days a week for at least 30 minutes.  If you are not already physically active start slow and gradually work up to 30 minutes of moderate physical activity.  Examples of moderate activity include walking briskly, mowing the yard, dancing, swimming  bicycling, etc. Eat a healthy diet- Eat a variety of healthy foods such as fruits, vegetables, low fat milk, low fat cheese, yogurt, lean meats, poultry, fish, beans, tofu, etc.  For more information on healthy eating, go to www.thenutritionsource.org Drink alcohol in moderation- Limit alcohol intake two drinks or less a day.  Never drink and drive. Dentist- Brush and floss teeth twice daily; visit your dentis twice a year. Depression-Your emotional health is as important as your physical health.  If you're feeling down, losing interest in things you normally enjoy please talk with your healthcare provider. Gun Safety- If you keep a gun in your home, keep it unloaded and with the safety lock on.  Bullets should be stored separately. Helmet use- Always wear a helmet when riding a motorcycle, bicycle, rollerblading or skateboarding. Safe sex- If you may be exposed to a sexually transmitted infection, use a condom Seat belts- Seat bels can save your life; always wear one. Smoke/Carbon Monoxide detectors- These detectors need to be installed on the appropriate level of your home.  Replace batteries at least once a year. Skin Cancer- When out in the sun, cover up and use sunscreen SPF 15 or higher. Violence- If anyone is threatening or hurting you, please tell your healthcare provider.

## 2022-10-22 NOTE — Progress Notes (Signed)
Patient is here to established care with provider today. Patient has many health concern they would like to discuss with provider today  Care gaps discuss at appointment today  

## 2022-10-23 LAB — HEPATIC FUNCTION PANEL
ALT: 24 IU/L (ref 0–32)
AST: 24 IU/L (ref 0–40)
Albumin: 4.6 g/dL (ref 3.9–4.9)
Alkaline Phosphatase: 82 IU/L (ref 44–121)
Bilirubin Total: 0.4 mg/dL (ref 0.0–1.2)
Bilirubin, Direct: 0.13 mg/dL (ref 0.00–0.40)
Total Protein: 7.6 g/dL (ref 6.0–8.5)

## 2022-11-16 ENCOUNTER — Ambulatory Visit: Payer: Self-pay

## 2022-11-16 ENCOUNTER — Telehealth: Payer: BC Managed Care – PPO | Admitting: Physician Assistant

## 2022-11-16 DIAGNOSIS — R3989 Other symptoms and signs involving the genitourinary system: Secondary | ICD-10-CM

## 2022-11-16 DIAGNOSIS — T3695XA Adverse effect of unspecified systemic antibiotic, initial encounter: Secondary | ICD-10-CM

## 2022-11-16 DIAGNOSIS — B379 Candidiasis, unspecified: Secondary | ICD-10-CM

## 2022-11-16 MED ORDER — FLUCONAZOLE 150 MG PO TABS
150.0000 mg | ORAL_TABLET | ORAL | 0 refills | Status: DC | PRN
Start: 2022-11-16 — End: 2023-03-11

## 2022-11-16 MED ORDER — CIPROFLOXACIN HCL 500 MG PO TABS
500.0000 mg | ORAL_TABLET | Freq: Two times a day (BID) | ORAL | 0 refills | Status: AC
Start: 2022-11-16 — End: 2022-11-19

## 2022-11-16 NOTE — Progress Notes (Signed)
E-Visit for Urinary Problems  We are sorry that you are not feeling well.  Here is how we plan to help!  Based on what you shared with me it looks like you most likely have a simple urinary tract infection.  A UTI (Urinary Tract Infection) is a bacterial infection of the bladder.  Most cases of urinary tract infections are simple to treat but a key part of your care is to encourage you to drink plenty of fluids and watch your symptoms carefully.  I have prescribed Cipro 500mg  Take 1 tablet twice daily for 3 days. I have also provided Fluconazole 150mg  for antibiotic induced yeast infection to use once antibiotic is completed.  Your symptoms should gradually improve. Call us if the burning in your urine worsens, you develop worsening fever, back pain or pelvic pain or if your symptoms do not resolve after completing the antibiotic.  Urinary tract infections can be prevented by drinking plenty of water to keep your body hydrated.  Also be sure when you wipe, wipe from front to back and don't hold it in!  If possible, empty your bladder every 4 hours.  HOME CARE Drink plenty of fluids Compete the full course of the antibiotics even if the symptoms resolve Remember, when you need to go.go. Holding in your urine can increase the likelihood of getting a UTI! GET HELP RIGHT AWAY IF: You cannot urinate You get a high fever Worsening back pain occurs You see blood in your urine You feel sick to your stomach or throw up You feel like you are going to pass out  MAKE SURE YOU  Understand these instructions. Will watch your condition. Will get help right away if you are not doing well or get worse.   Thank you for choosing an e-visit.  Your e-visit answers were reviewed by a board certified advanced clinical practitioner to complete your personal care plan. Depending upon the condition, your plan could have included both over the counter or prescription medications.  Please review your  pharmacy choice. Make sure the pharmacy is open so you can pick up prescription now. If there is a problem, you may contact your provider through [employer] and have the prescription routed to another pharmacy.  Your safety is important to [place]. If you have drug allergies check your prescription carefully.   For the next 24 hours you can use MyChart to ask questions about today's visit, request a non-urgent call back, or ask for a work or school excuse. You will get an email in the next two days asking about your experience. I hope that your e-visit has been valuable and will speed your recovery.  I have spent 5 minutes in review of e-visit questionnaire, review and updating patient chart, medical decision making and response to patient.   Margaretann Loveless, [name]

## 2022-11-17 ENCOUNTER — Telehealth: Payer: Self-pay | Admitting: Family

## 2022-11-17 NOTE — Telephone Encounter (Unsigned)
Copied from CRM [id number]. Topic: General - Other >> Nov 17, 2022  3:43 PM Epimenio Foot F wrote: Reason for CRM: Pt is calling in because she had a referral for bariatrics and she hasn't heard anything from them. Pt says she got her insurance situated and wanted to know if the referral could be sent again.

## 2022-11-17 NOTE — Telephone Encounter (Signed)
Copied from CRM [id number]. Topic: General - Other >> Nov 17, 2022  3:43 PM Epimenio Foot F wrote: Reason for CRM: Pt is calling in because she had a referral for bariatrics and she hasn't heard anything from them. Pt says she got her insurance situated and wanted to know if the referral could be sent again.

## 2022-11-18 ENCOUNTER — Ambulatory Visit
Admission: EM | Admit: 2022-11-18 | Discharge: 2022-11-18 | Disposition: A | Discharge status: Home / Self Care | Payer: BC Managed Care – PPO | Attending: Physician Assistant | Admitting: Physician Assistant

## 2022-11-18 DIAGNOSIS — R300 Dysuria: Secondary | ICD-10-CM | POA: Diagnosis not present

## 2022-11-18 DIAGNOSIS — F17210 Nicotine dependence, cigarettes, uncomplicated: Secondary | ICD-10-CM | POA: Insufficient documentation

## 2022-11-18 DIAGNOSIS — N898 Other specified noninflammatory disorders of vagina: Secondary | ICD-10-CM | POA: Insufficient documentation

## 2022-11-18 DIAGNOSIS — R399 Unspecified symptoms and signs involving the genitourinary system: Secondary | ICD-10-CM | POA: Insufficient documentation

## 2022-11-18 MED ORDER — NITROFURANTOIN MONOHYD MACRO 100 MG PO CAPS: 100.0000 mg | ORAL_CAPSULE | Freq: Two times a day (BID) | ORAL | 0 refills | Status: DC

## 2022-11-18 NOTE — ED Provider Notes (Signed)
[facility]    CSN: [id number] Arrival date & time: 11/18/22  1326      History   Chief Complaint Chief Complaint  Patient presents with   UTI Symptoms    HPI Kathy JERVEY is a 37 y.o. female.   Patient here today for evaluation of possible UTI.  She states that she had an e-visit Monday and was provided Cipro for treatment but states her symptoms have not improved.  She also notes some vaginal odor.  She did have STD screening this was negative.  The history is provided by the patient.    Past Medical History:  Diagnosis Date   Bipolar 1 disorder (HCC)    Hepatitis    Hep C    Patient Active Problem List   Diagnosis Date Noted   [redacted] weeks gestation of pregnancy 10/22/2022   Admission for palliative care 10/22/2022   Airway concern, fetal, affecting maternal care 10/22/2022   RLS (restless legs syndrome) 11/19/2021   OSA (obstructive sleep apnea) 07/29/2021   COPD, moderate (HCC) 04/03/2021   Gastroesophageal reflux disease without esophagitis 02/04/2021   History of substance abuse (HCC) 02/04/2021   Morbid obesity (HCC) 02/04/2021   Chronic RUQ pain 10/25/2019   Postpartum care following cesarean delivery 03/08/2019   Fetal anomaly necessitating delivery 02/27/2019   Hepatitis C antibody positive in blood 02/14/2019   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 02/14/2019   Polyhydramnios affecting pregnancy in third trimester 12/28/2018   Suspected fetal anomaly, antepartum 12/28/2018   AMA (advanced maternal age) multigravida 35+, third trimester 11/15/2018   Encounter for maternal care for low transverse scar from previous cesarean delivery 08/19/2016   Bipolar disorder, in partial remission, most recent episode mixed (HCC) 06/15/2016   Chronic hepatitis C without hepatic coma (HCC) 06/15/2016   Heavy smoker 06/15/2016   Previous cesarean delivery affecting pregnancy 06/15/2016   Herpes simplex antibody positive 06/15/2016    Heroin abuse (HCC) 02/16/2013   Cellulitis 02/15/2013   Polysubstance abuse (HCC) 02/15/2013    Past Surgical History:  Procedure Laterality Date   c section x 2      CESAREAN SECTION     GASTRIC BYPASS     TEMPOROMANDIBULAR JOINT ARTHROPLASTY     TMJ ARTHROPLASTY      OB History     Gravida  3   Para  2   Term  2   Preterm      AB      Living  2      SAB      IAB      Ectopic      Multiple      Live Births  2            Home Medications    Prior to Admission medications   Medication Sig Start Date End Date Taking? Authorizing Provider  ciprofloxacin (CIPRO) 500 MG tablet Take 1 tablet (500 mg total) by mouth 2 (two) times daily for 3 days. 11/16/22 11/19/22 Yes Burnette, Alessandra Bevels, [name]  fluconazole (DIFLUCAN) 150 MG tablet Take 1 tablet (150 mg total) by mouth every 3 (three) days as needed. 11/16/22  Yes Margaretann Loveless, [name]  hydrocortisone cream 1 % Apply 1 Application topically 2 (two) times daily.   Yes [provider]  nitrofurantoin, macrocrystal-monohydrate, (MACROBID) 100 MG capsule Take 1 capsule (100 mg total) by mouth 2 (two) times daily. 11/18/22  Yes Tomi Bamberger, [name]  phenazopyridine (PYRIDIUM) 97 MG tablet  Take 97 mg by mouth 3 (three) times daily as needed for pain.   Yes [provider]    Family History Family History  Problem Relation Age of Onset   Hypertension Other    Diabetes Other    Cancer Other    Stroke Other     Social History Social History   Tobacco Use   Smoking status: Every Day    Types: Cigarettes   Smokeless tobacco: Never  Vaping Use   Vaping status: Never Used  Substance Use Topics   Alcohol use: No   Drug use: Not Currently    Types: Marijuana, Cocaine, Heroin    Comment: 02/14/2013 last use, heroine last use 11/21/14     Allergies   Brassica oleracea, Alcohol, and Codeine   Review of Systems Review of Systems  Constitutional:  Negative for chills and fever.   Eyes:  Negative for discharge and redness.  Gastrointestinal:  Negative for abdominal pain, nausea and vomiting.  Genitourinary:  Positive for dysuria and vaginal discharge.     Physical Exam Triage Vital Signs ED Triage Vitals  Encounter Vitals Group     BP 11/18/22 1402 101/67     Systolic BP Percentile --      Diastolic BP Percentile --      Pulse Rate 11/18/22 1402 76     Resp 11/18/22 1402 18     Temp 11/18/22 1402 98.4 F (36.9 C)     Temp Source 11/18/22 1402 Oral     SpO2 11/18/22 1402 99 %     Weight 11/18/22 1359 188 lb 12.8 oz (85.6 kg)     Height 11/18/22 1359 5\' 5"  (1.651 m)     Head Circumference --      Peak Flow --      Pain Score 11/18/22 1356 0     Pain Loc --      Pain Education --      Exclude from Growth Chart --    No data found.  Updated Vital Signs BP 101/67 (BP Location: Left Arm)   Pulse 76   Temp 98.4 F (36.9 C) (Oral)   Resp 18   Ht 5\' 5"  (1.651 m)   Wt 188 lb 12.8 oz (85.6 kg)   SpO2 99%   BMI 31.42 kg/m      Physical Exam Vitals and nursing note reviewed.  Constitutional:      General: She is not in acute distress.    Appearance: Normal appearance. She is not ill-appearing.  HENT:     Head: Normocephalic and atraumatic.  Eyes:     Conjunctiva/sclera: Conjunctivae normal.  Cardiovascular:     Rate and Rhythm: Normal rate.  Pulmonary:     Effort: Pulmonary effort is normal. No respiratory distress.  Neurological:     Mental Status: She is alert.  Psychiatric:        Mood and Affect: Mood normal.        Behavior: Behavior normal.        Thought Content: Thought content normal.      UC Treatments / Results  Labs (all labs ordered are listed, but only abnormal results are displayed) Labs Reviewed  URINE CULTURE  CERVICOVAGINAL ANCILLARY ONLY    EKG   Radiology No results found.  Procedures Procedures (including critical care time)  Medications Ordered in UC Medications - No data to display  Initial  Impression / Assessment and Plan / UC Course  I have reviewed the triage vital  signs and the nursing notes.  Pertinent labs & imaging results that were available during my care of the patient were reviewed by me and considered in my medical decision making (see chart for details).    Will treat with Macrobid to cover possible UTI if not covered by Cipro.  Urine culture ordered.  UA not performed given patient had taken Azo.  Cervicovaginal swab screening repeated.  Final Clinical Impressions(s) / UC Diagnoses   Final diagnoses:  Urinary tract infection symptoms   Discharge Instructions   None    ED Prescriptions     Medication Sig Dispense Auth. Provider   nitrofurantoin, macrocrystal-monohydrate, (MACROBID) 100 MG capsule Take 1 capsule (100 mg total) by mouth 2 (two) times daily. 10 capsule Tomi Bamberger, [name]      PDMP not reviewed this encounter.   Tomi Bamberger, [name] 11/19/22 2125

## 2022-11-18 NOTE — ED Triage Notes (Signed)
"  I think I have a UTI". "I did an E-Visit on Monday given Cipro but my symptoms haven't improved". Current symptoms that remain "Dysuria" & "I also have a vaginal odor". "I have had STI testing and all negative".

## 2022-11-19 ENCOUNTER — Encounter: Payer: Self-pay | Admitting: Physician Assistant

## 2022-11-19 LAB — CERVICOVAGINAL ANCILLARY ONLY
Bacterial Vaginitis (gardnerella): NEGATIVE
Candida Glabrata: NEGATIVE
Candida Vaginitis: NEGATIVE
Chlamydia: NEGATIVE
Comment: NEGATIVE
Comment: NEGATIVE
Comment: NEGATIVE
Comment: NEGATIVE
Comment: NEGATIVE
Comment: NORMAL
Neisseria Gonorrhea: NEGATIVE
Trichomonas: NEGATIVE

## 2022-11-19 LAB — URINE CULTURE: Culture: NO GROWTH

## 2022-11-25 ENCOUNTER — Other Ambulatory Visit: Payer: Self-pay | Admitting: Family

## 2022-11-25 DIAGNOSIS — Z9884 Bariatric surgery status: Secondary | ICD-10-CM

## 2022-11-25 NOTE — Telephone Encounter (Signed)
Complete

## 2022-12-08 ENCOUNTER — Encounter: Payer: Self-pay | Admitting: Family Medicine

## 2022-12-08 ENCOUNTER — Telehealth: Payer: Self-pay | Admitting: Emergency Medicine

## 2022-12-08 ENCOUNTER — Ambulatory Visit (INDEPENDENT_AMBULATORY_CARE_PROVIDER_SITE_OTHER): Payer: Medicaid Other | Admitting: Family Medicine

## 2022-12-08 VITALS — BP 123/80 | HR 81 | Temp 98.1°F | Resp 16 | Ht 65.0 in | Wt 188.2 lb

## 2022-12-08 DIAGNOSIS — Z130 Encounter for screening for diseases of the blood and blood-forming organs and certain disorders involving the immune mechanism: Secondary | ICD-10-CM

## 2022-12-08 DIAGNOSIS — Z0000 Encounter for general adult medical examination without abnormal findings: Secondary | ICD-10-CM

## 2022-12-08 DIAGNOSIS — Z23 Encounter for immunization: Secondary | ICD-10-CM

## 2022-12-08 DIAGNOSIS — Z13220 Encounter for screening for lipoid disorders: Secondary | ICD-10-CM

## 2022-12-08 MED ORDER — ALBUTEROL SULFATE HFA 108 (90 BASE) MCG/ACT IN AERS: 2.0000 | INHALATION_SPRAY | Freq: Four times a day (QID) | RESPIRATORY_TRACT | 0 refills | Status: DC | PRN

## 2022-12-08 NOTE — Telephone Encounter (Signed)
I spoke with patient about her labs.  Patient left without getting  lab work done.  She stated "she will call back tomorrow to let us know when she can come back in and get them done."

## 2022-12-08 NOTE — Progress Notes (Unsigned)
Established Patient Office Visit  Subjective    Patient ID: Kathy Burns, female    DOB: Mar 02, 1986  Age: 37 y.o. MRN: [id number]  CC:  Chief Complaint  Patient presents with   Annual Exam    referrals    HPI Kathy Burns presents for routine annual exam.   Outpatient Encounter Medications as of 12/08/2022  Medication Sig   fluconazole (DIFLUCAN) 150 MG tablet Take 1 tablet (150 mg total) by mouth every 3 (three) days as needed. (Patient not taking: Reported on 12/08/2022)   hydrocortisone cream 1 % Apply 1 Application topically 2 (two) times daily. (Patient not taking: Reported on 12/08/2022)   nitrofurantoin, macrocrystal-monohydrate, (MACROBID) 100 MG capsule Take 1 capsule (100 mg total) by mouth 2 (two) times daily. (Patient not taking: Reported on 12/08/2022)   phenazopyridine (PYRIDIUM) 97 MG tablet Take 97 mg by mouth 3 (three) times daily as needed for pain. (Patient not taking: Reported on 12/08/2022)   No facility-administered encounter medications on file as of 12/08/2022.    Past Medical History:  Diagnosis Date   Bipolar 1 disorder (HCC)    Hepatitis    Hep C    Past Surgical History:  Procedure Laterality Date   c section x 2      CESAREAN SECTION     GASTRIC BYPASS     TEMPOROMANDIBULAR JOINT ARTHROPLASTY     TMJ ARTHROPLASTY      Family History  Problem Relation Age of Onset   Hypertension Other    Diabetes Other    Cancer Other    Stroke Other     Social History   Socioeconomic History   Marital status: Legally Separated    Spouse name: Not on file   Number of children: 4   Years of education: Not on file   Highest education level: High school graduate  Occupational History   Not on file  Tobacco Use   Smoking status: Every Day    Types: Cigarettes   Smokeless tobacco: Never  Vaping Use   Vaping status: Never Used  Substance and Sexual Activity   Alcohol use: No   Drug use: Not Currently    Types: Marijuana, Cocaine, Heroin     Comment: 02/14/2013 last use, heroine last use 11/21/14   Sexual activity: Yes    Birth control/protection: None  Other Topics Concern   Not on file  Social History Narrative   Not on file   Social Determinants of Health   Financial Resource Strain: Low Risk  (12/08/2022)   Overall Financial Resource Strain (CARDIA)    Difficulty of Paying Living Expenses: Not hard at all  Food Insecurity: No Food Insecurity (12/08/2022)   Hunger Vital Sign    Worried About Running Out of Food in the Last Year: Never true    Ran Out of Food in the Last Year: Never true  Transportation Needs: No Transportation Needs (12/08/2022)   PRAPARE - Administrator, Civil Service (Medical): No    Lack of Transportation (Non-Medical): No  Physical Activity: Sufficiently Active (12/08/2022)   Exercise Vital Sign    Days of Exercise per Week: 5 days    Minutes of Exercise per Session: 30 min  Stress: No Stress Concern Present (12/08/2022)   [employer] of Occupational Health - Occupational Stress Questionnaire    Feeling of Stress : Only a little  Social Connections: Moderately Isolated (12/08/2022)   Social Connection and Isolation Panel [NHANES]    Frequency  of Communication with Friends and Family: More than three times a week    Frequency of Social Gatherings with Friends and Family: More than three times a week    Attends Religious Services: More than 4 times per year    Active Member of [employer] or Organizations: No    Attends Banker Meetings: Never    Marital Status: Separated  Intimate Partner Violence: Not At Risk (12/08/2022)   Humiliation, Afraid, Rape, and Kick questionnaire    Fear of Current or Ex-Partner: No    Emotionally Abused: No    Physically Abused: No    Sexually Abused: No    ROS      Objective    BP 123/80 (BP Location: Right Arm, Patient Position: Sitting, Cuff Size: Normal)   Pulse 81   Temp 98.1 F (36.7 C) (Oral)   Resp 16   Ht 5\' 5"  (1.651 m)    Wt 188 lb 3.2 oz (85.4 kg)   SpO2 96%   BMI 31.32 kg/m   Physical Exam  {Labs (Optional):23779}    Assessment & Plan:   There are no diagnoses linked to this encounter.   No follow-ups on file.   Tommie Raymond, MD

## 2022-12-09 ENCOUNTER — Encounter: Payer: Self-pay | Admitting: Family Medicine

## 2022-12-14 ENCOUNTER — Telehealth: Payer: Medicaid Other | Admitting: Nurse Practitioner

## 2022-12-14 ENCOUNTER — Other Ambulatory Visit: Payer: Medicaid Other

## 2022-12-14 DIAGNOSIS — J440 Chronic obstructive pulmonary disease with acute lower respiratory infection: Secondary | ICD-10-CM

## 2022-12-14 DIAGNOSIS — J209 Acute bronchitis, unspecified: Secondary | ICD-10-CM

## 2022-12-14 MED ORDER — PREDNISONE 20 MG PO TABS
20.0000 mg | ORAL_TABLET | Freq: Two times a day (BID) | ORAL | 0 refills | Status: AC
Start: 2022-12-14 — End: 2022-12-19

## 2022-12-14 MED ORDER — BENZONATATE 100 MG PO CAPS: 100.0000 mg | ORAL_CAPSULE | Freq: Three times a day (TID) | ORAL | 0 refills | Status: DC | PRN

## 2022-12-14 MED ORDER — AZITHROMYCIN 250 MG PO TABS
ORAL_TABLET | ORAL | 0 refills | Status: AC
Start: 2022-12-14 — End: 2022-12-19

## 2022-12-14 NOTE — Progress Notes (Signed)
Patient came in for labs to be drawn

## 2022-12-14 NOTE — Progress Notes (Signed)
E-Visit for Cough   We are sorry that you are not feeling well.  Here is how we plan to help!  Based on your presentation I believe you most likely have A cough due to bacteria.  When patients have a fever and a productive cough with a change in color or increased sputum production, we are concerned about bacterial bronchitis.  If left untreated it can progress to pneumonia.  If your symptoms do not improve with your treatment plan it is important that you contact your provider.   I have prescribed Azithromyin 250 mg: two tablets now and then one tablet daily for 4 additonal days    In addition you may use A prescription cough medication called Tessalon Perles 100mg . You may take 1-2 capsules every 8 hours as needed for your cough.  Prednisone 20 mg twice daily for 5 days (see taper instructions below)  Be sure you are also using your Albuterol every 4-6 hours as directed   From your responses in the eVisit questionnaire you describe inflammation in the upper respiratory tract which is causing a significant cough.  This is commonly called Bronchitis and has four common causes:   Allergies Viral Infections Acid Reflux Bacterial Infection Allergies, viruses and acid reflux are treated by controlling symptoms or eliminating the cause. An example might be a cough caused by taking certain blood pressure medications. You stop the cough by changing the medication. Another example might be a cough caused by acid reflux. Controlling the reflux helps control the cough.  USE OF BRONCHODILATOR ("RESCUE") INHALERS: There is a risk from using your bronchodilator too frequently.  The risk is that over-reliance on a medication which only relaxes the muscles surrounding the breathing tubes can reduce the effectiveness of medications prescribed to reduce swelling and congestion of the tubes themselves.  Although you feel brief relief from the bronchodilator inhaler, your asthma may actually be worsening with the  tubes becoming more swollen and filled with mucus.  This can delay other crucial treatments, such as oral steroid medications. If you need to use a bronchodilator inhaler daily, several times per day, you should discuss this with your provider.  There are probably better treatments that could be used to keep your asthma under control.     HOME CARE Only take medications as instructed by your medical team. Complete the entire course of an antibiotic. Drink plenty of fluids and get plenty of rest. Avoid close contacts especially the very young and the elderly Cover your mouth if you cough or cough into your sleeve. Always remember to wash your hands A steam or ultrasonic humidifier can help congestion.   GET HELP RIGHT AWAY IF: You develop worsening fever. You become short of breath You cough up blood. Your symptoms persist after you have completed your treatment plan MAKE SURE YOU  Understand these instructions. Will watch your condition. Will get help right away if you are not doing well or get worse.    Thank you for choosing an e-visit.  Your e-visit answers were reviewed by a board certified advanced clinical practitioner to complete your personal care plan. Depending upon the condition, your plan could have included both over the counter or prescription medications.  Please review your pharmacy choice. Make sure the pharmacy is open so you can pick up prescription now. If there is a problem, you may contact your provider through [employer] and have the prescription routed to another pharmacy.  Your safety is important to [place]. If  you have drug allergies check your prescription carefully.   For the next 24 hours you can use MyChart to ask questions about today's visit, request a non-urgent call back, or ask for a work or school excuse. You will get an email in the next two days asking about your experience. I hope that your e-visit has been valuable and will speed your recovery.    Meds ordered this encounter  Medications   benzonatate (TESSALON) 100 MG capsule    Sig: Take 1 capsule (100 mg total) by mouth 3 (three) times daily as needed.    Dispense:  30 capsule    Refill:  0   azithromycin (ZITHROMAX) 250 MG tablet    Sig: Take 2 tablets on day 1, then 1 tablet daily on days 2 through 5    Dispense:  6 tablet    Refill:  0   predniSONE (DELTASONE) 20 MG tablet    Sig: Take 1 tablet (20 mg total) by mouth 2 (two) times daily with a meal for 5 days.    Dispense:  10 tablet    Refill:  0    I spent approximately 5 minutes reviewing the patient's history, current symptoms and coordinating their care today.

## 2022-12-15 ENCOUNTER — Telehealth (HOSPITAL_COMMUNITY): Payer: Self-pay

## 2022-12-15 LAB — CBC WITH DIFFERENTIAL/PLATELET
Basophils Absolute: 0.1 10*3/uL (ref 0.0–0.2)
Basos: 1 %
EOS (ABSOLUTE): 0.4 10*3/uL (ref 0.0–0.4)
Eos: 5 %
Hematocrit: 40.7 % (ref 34.0–46.6)
Hemoglobin: 12.5 g/dL (ref 11.1–15.9)
Immature Grans (Abs): 0 10*3/uL (ref 0.0–0.1)
Immature Granulocytes: 0 %
Lymphocytes Absolute: 1.5 10*3/uL (ref 0.7–3.1)
Lymphs: 23 %
MCH: 27.7 pg (ref 26.6–33.0)
MCHC: 30.7 g/dL — ABNORMAL LOW (ref 31.5–35.7)
MCV: 90 fL (ref 79–97)
Monocytes Absolute: 0.3 10*3/uL (ref 0.1–0.9)
Monocytes: 5 %
Neutrophils Absolute: 4.3 10*3/uL (ref 1.4–7.0)
Neutrophils: 66 %
Platelets: 283 10*3/uL (ref 150–450)
RBC: 4.52 x10E6/uL (ref 3.77–5.28)
RDW: 14.2 % (ref 11.7–15.4)
WBC: 6.6 10*3/uL (ref 3.4–10.8)

## 2022-12-15 LAB — CMP14+EGFR
ALT: 24 [IU]/L (ref 0–32)
AST: 22 [IU]/L (ref 0–40)
Albumin: 4.3 g/dL (ref 3.9–4.9)
Alkaline Phosphatase: 96 [IU]/L (ref 44–121)
BUN/Creatinine Ratio: 19 (ref 9–23)
BUN: 13 mg/dL (ref 6–20)
Bilirubin Total: 0.5 mg/dL (ref 0.0–1.2)
CO2: 22 mmol/L (ref 20–29)
Calcium: 9.2 mg/dL (ref 8.7–10.2)
Chloride: 105 mmol/L (ref 96–106)
Creatinine, Ser: 0.69 mg/dL (ref 0.57–1.00)
Globulin, Total: 3 g/dL (ref 1.5–4.5)
Glucose: 86 mg/dL (ref 70–99)
Potassium: 4.1 mmol/L (ref 3.5–5.2)
Sodium: 141 mmol/L (ref 134–144)
Total Protein: 7.3 g/dL (ref 6.0–8.5)
eGFR: 113 mL/min/{1.73_m2} (ref >59)

## 2022-12-15 LAB — LIPID PANEL
Chol/HDL Ratio: 2.5 {ratio} (ref 0.0–4.4)
Cholesterol, Total: 127 mg/dL (ref 100–199)
HDL: 50 mg/dL (ref >39)
LDL Chol Calc (NIH): 59 mg/dL (ref 0–99)
Triglycerides: 96 mg/dL (ref 0–149)
VLDL Cholesterol Cal: 18 mg/dL (ref 5–40)

## 2023-01-21 ENCOUNTER — Telehealth: Payer: Medicaid Other | Admitting: Nurse Practitioner

## 2023-01-21 DIAGNOSIS — H6690 Otitis media, unspecified, unspecified ear: Secondary | ICD-10-CM

## 2023-01-21 MED ORDER — AMOXICILLIN 875 MG PO TABS: 875.0000 mg | ORAL_TABLET | Freq: Two times a day (BID) | ORAL | 0 refills | Status: AC

## 2023-01-21 NOTE — Progress Notes (Signed)
E-Visit for Ear Pain - Acute Otitis Media   We are sorry that you are not feeling well. Here is how we plan to help!  Based on what you have shared with me it looks like you have Acute Otitis Media.  Acute Otitis Media is an infection of the middle or "inner" ear. This type of infection can cause redness, inflammation, and fluid buildup behind the tympanic membrane (ear drum).  The usual symptoms include: Earache/Pain Fever Upper respiratory symptoms Lack of energy/Fatigue/Malaise Slight hearing loss gradually worsening- if the inner ear fills with fluid What causes middle ear infections? Most middle ear infections occur when an infection such as a cold, leads to a build-up of mucus in the middle ear and causes the Eustachian tube (a thin tube that runs from the middle ear to the back of the nose) to become swollen or blocked.   This means mucus can't drain away properly, making it easier for an infection to spread into the middle ear.  How middle ear infections are treated: Most ear infections clear up within three to five days and don't need any specific treatment. If necessary, tylenol or ibuprofen should be used to relieve pain and a high temperature.  If you develop a fever higher than 102, or any significantly worsening symptoms, this could indicate a more serious infection moving to the middle/inner and needs face to face evaluation in an office by a provider.   Antibiotics aren't routinely used to treat middle ear infections, although they may occasionally be prescribed if symptoms persist or are particularly severe. Given your presentation,   I have prescribed Amoxicillin 875 mg one tablet twice daily for 10 days    Your symptoms should improve over the next 3 days and should resolve in about 7 days. Be sure to complete ALL of the prescription(s) given.  HOME CARE: Wash your hands frequently. If you are prescribed an ear drop, do not place the tip of the bottle on your ear or  touch it with your fingers. You can take Acetaminophen 650 mg every 4-6 hours as needed for pain.  If pain is severe or moderate, you can apply a heating pad (set on low) or hot water bottle (wrapped in a towel) to outer ear for 20 minutes.  This will also increase drainage.  GET HELP RIGHT AWAY IF: Fever is over 102.2 degrees. You develop progressive ear pain or hearing loss. Ear symptoms persist longer than 3 days after treatment.  MAKE SURE YOU: Understand these instructions. Will watch your condition. Will get help right away if you are not doing well or get worse.  Thank you for choosing an e-visit.  Your e-visit answers were reviewed by a board certified advanced clinical practitioner to complete your personal care plan. Depending upon the condition, your plan could have included both over the counter or prescription medications.  Please review your pharmacy choice. Make sure the pharmacy is open so you can pick up the prescription now. If there is a problem, you may contact your provider through [employer] and have the prescription routed to another pharmacy.  Your safety is important to [place]. If you have drug allergies check your prescription carefully.   For the next 24 hours you can use MyChart to ask questions about today's visit, request a non-urgent call back, or ask for a work or school excuse. You will get an email with a survey after your eVisit asking about your experience. We would appreciate your feedback. I  hope that your e-visit has been valuable and will aid in your recovery.  Meds ordered this encounter  Medications   amoxicillin (AMOXIL) 875 MG tablet    Sig: Take 1 tablet (875 mg total) by mouth 2 (two) times daily for 10 days.    Dispense:  20 tablet    Refill:  0     I spent approximately 5 minutes reviewing the patient's history, current symptoms and coordinating their care today.

## 2023-01-30 ENCOUNTER — Telehealth: Payer: Medicaid Other | Admitting: Physician Assistant

## 2023-01-30 DIAGNOSIS — R3989 Other symptoms and signs involving the genitourinary system: Secondary | ICD-10-CM

## 2023-01-30 MED ORDER — NITROFURANTOIN MONOHYD MACRO 100 MG PO CAPS
100.0000 mg | ORAL_CAPSULE | Freq: Two times a day (BID) | ORAL | 0 refills | Status: AC
Start: 2023-01-30 — End: 2023-02-04

## 2023-01-30 NOTE — Progress Notes (Signed)
E-Visit for Urinary Problems  We are sorry that you are not feeling well.  Here is how we plan to help!  Based on what you shared with me it looks like you most likely have a simple urinary tract infection.  A UTI (Urinary Tract Infection) is a bacterial infection of the bladder.  Most cases of urinary tract infections are simple to treat but a key part of your care is to encourage you to drink plenty of fluids and watch your symptoms carefully.  I have prescribed MacroBid 100 mg twice a day for 5 days.  Your symptoms should gradually improve. Call us if the burning in your urine worsens, you develop worsening fever, back pain or pelvic pain or if your symptoms do not resolve after completing the antibiotic.  Urinary tract infections can be prevented by drinking plenty of water to keep your body hydrated.  Also be sure when you wipe, wipe from front to back and don't hold it in!  If possible, empty your bladder every 4 hours.  HOME CARE Drink plenty of fluids Compete the full course of the antibiotics even if the symptoms resolve Remember, when you need to go.go. Holding in your urine can increase the likelihood of getting a UTI! GET HELP RIGHT AWAY IF: You cannot urinate You get a high fever Worsening back pain occurs You see blood in your urine You feel sick to your stomach or throw up You feel like you are going to pass out  MAKE SURE YOU  Understand these instructions. Will watch your condition. Will get help right away if you are not doing well or get worse.   Thank you for choosing an e-visit.  Your e-visit answers were reviewed by a board certified advanced clinical practitioner to complete your personal care plan. Depending upon the condition, your plan could have included both over the counter or prescription medications.  Please review your pharmacy choice. Make sure the pharmacy is open so you can pick up prescription now. If there is a problem, you may contact your  provider through MyChart messaging and have the prescription routed to another pharmacy.  Your safety is important to us. If you have drug allergies check your prescription carefully.   For the next 24 hours you can use MyChart to ask questions about today's visit, request a non-urgent call back, or ask for a work or school excuse. You will get an email in the next two days asking about your experience. I hope that your e-visit has been valuable and will speed your recovery.   I have spent 5 minutes in review of e-visit questionnaire, review and updating patient chart, medical decision making and response to patient.   Adea Geisel Z Ward, [name]    

## 2023-02-01 ENCOUNTER — Telehealth: Payer: Medicaid Other

## 2023-02-01 DIAGNOSIS — B9689 Other specified bacterial agents as the cause of diseases classified elsewhere: Secondary | ICD-10-CM

## 2023-02-02 ENCOUNTER — Telehealth: Payer: Medicaid Other | Admitting: Physician Assistant

## 2023-02-02 DIAGNOSIS — B9689 Other specified bacterial agents as the cause of diseases classified elsewhere: Secondary | ICD-10-CM

## 2023-02-02 MED ORDER — METRONIDAZOLE 0.75 % EX GEL
CUTANEOUS | 0 refills | Status: DC
Start: 2023-02-02 — End: 2023-02-02

## 2023-02-02 MED ORDER — METRONIDAZOLE 0.75 % EX GEL: CUTANEOUS | 0 refills | Status: DC

## 2023-02-02 NOTE — Progress Notes (Signed)
Evisit for same problem earlier today

## 2023-02-02 NOTE — Addendum Note (Signed)
Addended by: Christeen Douglas Z on: 02/02/2023 08:09 PM   Modules accepted: Orders

## 2023-02-02 NOTE — Progress Notes (Signed)

## 2023-02-02 NOTE — Progress Notes (Signed)
I have spent 5 minutes in review of e-visit questionnaire, review and updating patient chart, medical decision making and response to patient.   Mia Milan Cody Jacklynn Dehaas, [name]    

## 2023-02-03 ENCOUNTER — Telehealth: Payer: Medicaid Other

## 2023-02-11 ENCOUNTER — Ambulatory Visit: Payer: Medicaid Other | Admitting: Family

## 2023-02-11 ENCOUNTER — Encounter: Payer: Self-pay | Admitting: Family

## 2023-02-11 VITALS — BP 111/76 | HR 76 | Temp 98.1°F | Ht 65.0 in | Wt 179.6 lb

## 2023-02-11 DIAGNOSIS — R748 Abnormal levels of other serum enzymes: Secondary | ICD-10-CM | POA: Diagnosis not present

## 2023-02-11 DIAGNOSIS — R160 Hepatomegaly, not elsewhere classified: Secondary | ICD-10-CM

## 2023-02-11 DIAGNOSIS — K760 Fatty (change of) liver, not elsewhere classified: Secondary | ICD-10-CM

## 2023-02-11 NOTE — Progress Notes (Signed)
Patient states she supposed to have CT done every , states she hasn't had it done in a year.   Asking for right ear to be looked at.

## 2023-02-11 NOTE — Progress Notes (Signed)
Patient ID: Kathy Burns, female    DOB: 1985/06/28  MRN: [id number]  CC: CT Scan  Subjective: Kathy Burns is a 37 y.o. female who presents for CT scan.  Her concerns today include:  - Reports history of liver mass. States she is supposed to have a CT scan with contrast every 6 months. Reports has been over 1 year since last CT scan. Reports currently has itching and pain of right upper quadrant. Denies red flag symptoms. States she never heard from Gastroenterology referral. - Right ear pain continuing. States she never finished antibiotic because she consumes alcohol. Patient counseled on importance of completing regimen. Patient verbalized understanding.   Patient Active Problem List   Diagnosis Date Noted   [redacted] weeks gestation of pregnancy 10/22/2022   Admission for palliative care 10/22/2022   Airway concern, fetal, affecting maternal care 10/22/2022   RLS (restless legs syndrome) 11/19/2021   OSA (obstructive sleep apnea) 07/29/2021   COPD, moderate (HCC) 04/03/2021   Gastroesophageal reflux disease without esophagitis 02/04/2021   History of substance abuse (HCC) 02/04/2021   Morbid obesity (HCC) 02/04/2021   Chronic RUQ pain 10/25/2019   Postpartum care following cesarean delivery 03/08/2019   Fetal anomaly necessitating delivery 02/27/2019   Hepatitis C antibody positive in blood 02/14/2019   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 02/14/2019   Polyhydramnios affecting pregnancy in third trimester 12/28/2018   Suspected fetal anomaly, antepartum 12/28/2018   AMA (advanced maternal age) multigravida 35+, third trimester 11/15/2018   Encounter for maternal care for low transverse scar from previous cesarean delivery 08/19/2016   Bipolar disorder, in partial remission, most recent episode mixed (HCC) 06/15/2016   Chronic hepatitis C without hepatic coma (HCC) 06/15/2016   Heavy smoker 06/15/2016   Previous cesarean delivery affecting pregnancy  06/15/2016   Herpes simplex antibody positive 06/15/2016   Heroin abuse (HCC) 02/16/2013   Cellulitis 02/15/2013   Polysubstance abuse (HCC) 02/15/2013     Current Outpatient Medications on File Prior to Visit  Medication Sig Dispense Refill   albuterol (VENTOLIN HFA) 108 (90 Base) MCG/ACT inhaler Inhale 2 puffs into the lungs every 6 (six) hours as needed for wheezing or shortness of breath. 8 g 0   metroNIDAZOLE (METROGEL) 0.75 % gel Insert one applicatorful (5g) of medicine into the vagina once nightly x 5 days 25 g 0   benzonatate (TESSALON) 100 MG capsule Take 1 capsule (100 mg total) by mouth 3 (three) times daily as needed. (Patient not taking: Reported on 02/11/2023) 30 capsule 0   fluconazole (DIFLUCAN) 150 MG tablet Take 1 tablet (150 mg total) by mouth every 3 (three) days as needed. (Patient not taking: Reported on 02/11/2023) 2 tablet 0   hydrocortisone cream 1 % Apply 1 Application topically 2 (two) times daily. (Patient not taking: Reported on 02/11/2023)     phenazopyridine (PYRIDIUM) 97 MG tablet Take 97 mg by mouth 3 (three) times daily as needed for pain. (Patient not taking: Reported on 02/11/2023)     No current facility-administered medications on file prior to visit.    Allergies  Allergen Reactions   Brassica Oleracea Anaphylaxis, Itching, Swelling and Other (See Comments)    Only raw broccoli causes reaction:  Tongue swelling with raw broccoli   Tongue swelling with raw broccoli   Other reaction(s): Other- (not listed) - Allergy  Tongue swelling with raw broccoli   Tylenol [Acetaminophen] Palpitations   Alcohol Hives and Other (See Comments)    Drinking alcohol (not  rubbing alcohol)   Codeine Itching    Social History   Socioeconomic History   Marital status: Legally Separated    Spouse name: Not on file   Number of children: 4   Years of education: Not on file   Highest education level: High school graduate  Occupational History   Not on file   Tobacco Use   Smoking status: Every Day    Types: Cigarettes   Smokeless tobacco: Never  Vaping Use   Vaping status: Never Used  Substance and Sexual Activity   Alcohol use: No   Drug use: Not Currently    Types: Marijuana, Cocaine, Heroin    Comment: 02/14/2013 last use, heroine last use 11/21/14   Sexual activity: Yes    Birth control/protection: None  Other Topics Concern   Not on file  Social History Narrative   Not on file   Social Drivers of Health   Financial Resource Strain: Low Risk  (12/08/2022)   Overall Financial Resource Strain (CARDIA)    Difficulty of Paying Living Expenses: Not hard at all  Food Insecurity: No Food Insecurity (12/08/2022)   Hunger Vital Sign    Worried About Running Out of Food in the Last Year: Never true    Ran Out of Food in the Last Year: Never true  Transportation Needs: No Transportation Needs (12/08/2022)   PRAPARE - Administrator, Civil Service (Medical): No    Lack of Transportation (Non-Medical): No  Physical Activity: Sufficiently Active (12/08/2022)   Exercise Vital Sign    Days of Exercise per Week: 5 days    Minutes of Exercise per Session: 30 min  Stress: No Stress Concern Present (12/08/2022)   [employer] of Occupational Health - Occupational Stress Questionnaire    Feeling of Stress : Only a little  Social Connections: Unknown (12/15/2022)   Received from [facility]   Social Network    Social Network: Not on file  Recent Concern: Social Connections - Moderately Isolated (12/08/2022)   Social Connection and Isolation Panel [NHANES]    Frequency of Communication with Friends and Family: More than three times a week    Frequency of Social Gatherings with Friends and Family: More than three times a week    Attends Religious Services: More than 4 times per year    Active Member of [employer] or Organizations: No    Attends Banker Meetings: Never    Marital Status: Separated  Intimate Partner  Violence: Unknown (12/15/2022)   Received from [facility]   HITS    Physically Hurt: Not on file    Insult or Talk Down To: Not on file    Threaten Physical Harm: Not on file    Scream or Curse: Not on file    Family History  Problem Relation Age of Onset   Hypertension Other    Diabetes Other    Cancer Other    Stroke Other     Past Surgical History:  Procedure Laterality Date   c section x 2      CESAREAN SECTION     GASTRIC BYPASS     TEMPOROMANDIBULAR JOINT ARTHROPLASTY     TMJ ARTHROPLASTY      ROS: Review of Systems Negative except as stated above  PHYSICAL EXAM: BP 111/76   Pulse 76   Temp 98.1 F (36.7 C) (Oral)   Ht 5\' 5"  (1.651 m)   Wt 179 lb 9.6 oz (81.5 kg)   LMP 02/11/2023 (Exact  Date)   SpO2 98%   BMI 29.89 kg/m   Physical Exam HENT:     Head: Normocephalic and atraumatic.     Right Ear: Tympanic membrane, ear canal and external ear normal.     Left Ear: Tympanic membrane, ear canal and external ear normal.     Nose: Nose normal.     Mouth/Throat:     Mouth: Mucous membranes are moist.     Pharynx: Oropharynx is clear.  Eyes:     Extraocular Movements: Extraocular movements intact.     Conjunctiva/sclera: Conjunctivae normal.     Pupils: Pupils are equal, round, and reactive to light.  Cardiovascular:     Rate and Rhythm: Normal rate and regular rhythm.     Pulses: Normal pulses.     Heart sounds: Normal heart sounds.  Pulmonary:     Effort: Pulmonary effort is normal.     Breath sounds: Normal breath sounds.  Abdominal:     General: Bowel sounds are normal.     Palpations: Abdomen is soft.     Tenderness: There is abdominal tenderness.  Musculoskeletal:        General: Normal range of motion.     Cervical back: Normal range of motion and neck supple.  Neurological:     General: No focal deficit present.     Mental Status: She is alert and oriented to person, place, and time.  Psychiatric:        Mood and Affect: Mood normal.         Behavior: Behavior normal.     ASSESSMENT AND PLAN: 1. Liver mass (Primary) 2. Fatty liver 3. Elevated liver enzymes - CT abdomen pelvis with contrast for evaluation.  - Recent liver enzymes normal on 12/14/2022. - Referral to Gastroenterology for evaluation/management.  - Follow-up with primary provider as scheduled.  - CT ABDOMEN PELVIS W CONTRAST; Future - Ambulatory referral to Gastroenterology     Patient was given the opportunity to ask questions.  Patient verbalized understanding of the plan and was able to repeat key elements of the plan. Patient was given clear instructions to go to Emergency Department or return to medical center if symptoms don't improve, worsen, or new problems develop.The patient verbalized understanding.   Orders Placed This Encounter  Procedures   CT ABDOMEN PELVIS W CONTRAST   Ambulatory referral to Gastroenterology    Follow-up with primary provider as scheduled.   Rema Fendt, NP

## 2023-02-15 ENCOUNTER — Encounter: Payer: Self-pay | Admitting: Family

## 2023-02-15 NOTE — Telephone Encounter (Signed)
Dr. Andrey Campanile,   Abdomen pelvis with contrast ordered on 12/19/23024 for evaluation of liver mass, fatty liver, elevated liver enzymes. Patient now requesting MRI instead. Please advise if appropriate for me to order.   Thank you

## 2023-02-18 ENCOUNTER — Ambulatory Visit
Admission: RE | Admit: 2023-02-18 | Discharge: 2023-02-18 | Disposition: A | Discharge status: Home / Self Care | Payer: Medicaid Other | Source: Ambulatory Visit | Attending: Family | Admitting: Family

## 2023-02-18 DIAGNOSIS — R160 Hepatomegaly, not elsewhere classified: Secondary | ICD-10-CM

## 2023-02-18 DIAGNOSIS — R748 Abnormal levels of other serum enzymes: Secondary | ICD-10-CM

## 2023-02-18 DIAGNOSIS — K760 Fatty (change of) liver, not elsewhere classified: Secondary | ICD-10-CM

## 2023-02-18 MED ORDER — IOPAMIDOL (ISOVUE-300) INJECTION 61%
100.0000 mL | Freq: Once | INTRAVENOUS | Status: AC | PRN
  Administered 2023-02-18: 100 mL via INTRAVENOUS

## 2023-02-18 NOTE — Telephone Encounter (Signed)
  Abdomen pelvis with contrast ordered on 12/19/23024 for evaluation of liver mass, fatty liver, elevated liver enzymes. Patient now requesting MRI instead. Please advise if appropriate for me to order.

## 2023-02-22 ENCOUNTER — Other Ambulatory Visit: Payer: Self-pay | Admitting: Family

## 2023-02-22 DIAGNOSIS — I700 Atherosclerosis of aorta: Secondary | ICD-10-CM

## 2023-03-11 ENCOUNTER — Ambulatory Visit: Payer: Medicaid Other | Admitting: Cardiology

## 2023-03-11 ENCOUNTER — Ambulatory Visit: Payer: Medicaid Other | Attending: Cardiology | Admitting: Cardiology

## 2023-03-11 ENCOUNTER — Ambulatory Visit: Payer: Medicaid Other | Attending: Cardiology

## 2023-03-11 ENCOUNTER — Encounter: Payer: Self-pay | Admitting: Cardiology

## 2023-03-11 VITALS — BP 122/88 | HR 69 | Ht 65.0 in | Wt 180.0 lb

## 2023-03-11 DIAGNOSIS — R002 Palpitations: Secondary | ICD-10-CM

## 2023-03-11 DIAGNOSIS — R0789 Other chest pain: Secondary | ICD-10-CM | POA: Insufficient documentation

## 2023-03-11 DIAGNOSIS — G4733 Obstructive sleep apnea (adult) (pediatric): Secondary | ICD-10-CM | POA: Diagnosis not present

## 2023-03-11 DIAGNOSIS — R072 Precordial pain: Secondary | ICD-10-CM

## 2023-03-11 HISTORY — DX: Palpitations: R00.2

## 2023-03-11 MED ORDER — METOPROLOL TARTRATE 100 MG PO TABS: ORAL_TABLET | ORAL | 0 refills | Status: DC

## 2023-03-11 MED ORDER — ASPIRIN 81 MG PO TBEC: 81.0000 mg | DELAYED_RELEASE_TABLET | Freq: Every day | ORAL | Status: DC

## 2023-03-11 NOTE — Patient Instructions (Signed)
 Medication Instructions:   TAKE: Metoprolol  100mg  1 tablet 2 hours prior to CT Scan  TAKE: 1 Aspirin  81mg  1 tablet daily   Lab Work: None Ordered If you have labs (blood work) drawn today and your tests are completely normal, you will receive your results only by: MyChart Message (if you have MyChart) OR A paper copy in the mail If you have any lab test that is abnormal or we need to change your treatment, we will call you to review the results.   Testing/Procedures:   WHY IS MY DOCTOR PRESCRIBING ZIO? The Zio system is proven and trusted by physicians to detect and diagnose irregular heart rhythms -- and has been prescribed to hundreds of thousands of patients.  The FDA has cleared the Zio system to monitor for many different kinds of irregular heart rhythms. In a study, physicians were able to reach a diagnosis 90% of the time with the Zio system1.  You can wear the Zio monitor -- a small, discreet, comfortable patch -- during your normal day-to-day activity, including while you sleep, shower, and exercise, while it records every single heartbeat for analysis.  1Barrett, P., et al. Comparison of 24 Hour Holter Monitoring Versus 14 Day Novel Adhesive Patch Electrocardiographic Monitoring. American Journal of Medicine, 2014.  ZIO VS. HOLTER MONITORING The Zio monitor can be comfortably worn for up to 14 days. Holter monitors can be worn for 24 to 48 hours, limiting the time to record any irregular heart rhythms you may have. Zio is able to capture data for the 51% of patients who have their first symptom-triggered arrhythmia after 48 hours.1  LIVE WITHOUT RESTRICTIONS The Zio ambulatory cardiac monitor is a small, unobtrusive, and water-resistant patch--you might even forget you're wearing it. The Zio monitor records and stores every beat of your heart, whether you're sleeping, working out, or showering.    Your cardiac CT will be scheduled at one of the below locations:   [facility] [address] [place], [place] [address] [phone]    If scheduled at [facility], please arrive at the [facility] and Children's Entrance (Entrance C2) of [facility] 30 minutes prior to test start time. You can use the FREE valet parking offered at entrance C (encouraged to control the heart rate for the test)  Proceed to the [facility] Radiology Department (first floor) to check-in and test prep.  All radiology patients and guests should use entrance C2 at [facility], accessed from [facility], even though the hospital's physical address listed is [address]     Please follow these instructions carefully (unless otherwise directed):      On the Day of the Test: Drink plenty of water until 1 hour prior to the test. Do not eat any food 4 hours prior to the test. You may take your regular medications prior to the test.  Take metoprolol  (Lopressor ) two hours prior to test. FEMALES- please wear underwire-free bra if available, avoid dresses & tight clothing        After the Test: Drink plenty of water. After receiving IV contrast, you may experience a mild flushed feeling. This is normal. On occasion, you may experience a mild rash up to 24 hours after the test. This is not dangerous. If this occurs, you can take Benadryl  25 mg and increase your fluid intake. If you experience trouble breathing, this can be serious. If it is severe call 911 IMMEDIATELY. If it is  mild, please call our office. If you take any of these medications: Glipizide/Metformin, Avandament, Glucavance, please do not take 48 hours after completing test unless otherwise instructed.  We will call to schedule your test 2-4 weeks out understanding that some insurance companies will need an authorization prior to the service being performed.   For non-scheduling related questions, please contact the cardiac imaging nurse navigator should you  have any questions/concerns: Camie Shutter, Cardiac Imaging Nurse Navigator Chantal Requena, Cardiac Imaging Nurse Navigator  Heart and Vascular Services Direct Office Dial: [phone]   For scheduling needs, including cancellations and rescheduling, please call Brittany, [phone].    Follow-Up: At [facility], you and your health needs are our priority.  As part of our continuing mission to provide you with exceptional heart care, we have created designated Provider Care Teams.  These Care Teams include your primary Cardiologist (physician) and Advanced Practice Providers (APPs -  Physician Assistants and Nurse Practitioners) who all work together to provide you with the care you need, when you need it.  We recommend signing up for the patient portal called MyChart.  Sign up information is provided on this After Visit Summary.  MyChart is used to connect with patients for Virtual Visits (Telemedicine).  Patients are able to view lab/test results, encounter notes, upcoming appointments, etc.  Non-urgent messages can be sent to your provider as well.   To learn more about what you can do with MyChart, go to [email or web address].    Your next appointment:   2 month(s)  The format for your next appointment:   In Person  Provider:   Lamar Fitch, MD    Other Instructions NA

## 2023-03-11 NOTE — Progress Notes (Signed)
 Cardiology Consultation:    Date:  03/11/2023   ID:  Kathy Burns, DOB Nov 04, 1985, MRN [id number]  PCP:  Lorren Greig PARAS, NP  Cardiologist:  Lamar Fitch, MD   Referring MD: Lorren Greig PARAS, NP   Chief Complaint  Patient presents with   Chest Pain   elevated HR   Hannd/ finger discoloration    History of Present Illness:    Kathy Burns is a 38 y.o. female who is being seen today for the evaluation of palpitations and chest pain at the request of Lorren Greig PARAS, NP.  Past medical history significant for chest pain which is increasing for follow-up already, she was seen by cardiologist Dr. Dayla year no stress test done only echocardiogram showed preserved ejection fraction without significant valvular pathology, also bipolar disorder, smoking or vaping, drinking, she also is also morbidly obese, did have gastric bypass surgery loss of 120 pounds since that time things improved.  Also have history of obstructive sleep apnea but cannot tolerate CPAP.  She was referred to me because of episode of chest pain which was somewhat atypical happening between constipation usually not related to exercise.  Another complaint that she has is palpitations palpitations sometimes wake her up in the middle of the night.  Sometimes she will feel flushed sensation and then palpitations typically abrupt onset gradual offset.  She does not have any dizziness or passing out history of those sensations.  She used to smoke cigarettes now she read, she also drink alcohol and she understands the problem and try to work on quitting.  Denies denies having any typical exertional tightness squeezing pressure burning chest.  She is not on any diet she does not exercise regular basis  Past Medical History:  Diagnosis Date   Bipolar 1 disorder (HCC)    Hepatitis    Hep C    Past Surgical History:  Procedure Laterality Date   c section x 2      CESAREAN SECTION     CHOLECYSTECTOMY     GASTRIC BYPASS      REMOVAL OF EAR TUBE     TEMPOROMANDIBULAR JOINT ARTHROPLASTY     TMJ ARTHROPLASTY     TONSILLECTOMY AND ADENOIDECTOMY     TUBAL LIGATION      Current Medications: Current Meds  Medication Sig   albuterol  (VENTOLIN  HFA) 108 (90 Base) MCG/ACT inhaler Inhale 2 puffs into the lungs every 6 (six) hours as needed for wheezing or shortness of breath.   [DISCONTINUED] benzonatate  (TESSALON ) 100 MG capsule Take 1 capsule (100 mg total) by mouth 3 (three) times daily as needed. (Patient taking differently: Take 100 mg by mouth 3 (three) times daily as needed for cough.)   [DISCONTINUED] fluconazole  (DIFLUCAN ) 150 MG tablet Take 1 tablet (150 mg total) by mouth every 3 (three) days as needed. (Patient taking differently: Take 150 mg by mouth every 3 (three) days as needed (fungus).)   [DISCONTINUED] hydrocortisone cream 1 % Apply 1 Application topically 2 (two) times daily.   [DISCONTINUED] metroNIDAZOLE  (METROGEL ) 0.75 % gel Insert one applicatorful (5g) of medicine into the vagina once nightly x 5 days (Patient taking differently: Apply 1 Application topically 2 (two) times daily. Insert one applicatorful (5g) of medicine into the vagina once nightly x 5 days)   [DISCONTINUED] phenazopyridine  (PYRIDIUM ) 97 MG tablet Take 97 mg by mouth 3 (three) times daily as needed for pain.     Allergies:   Brassica oleracea, Tylenol  [acetaminophen ], Alcohol, and Codeine  Social History   Socioeconomic History   Marital status: Legally Separated    Spouse name: Not on file   Number of children: 4   Years of education: Not on file   Highest education level: High school graduate  Occupational History   Not on file  Tobacco Use   Smoking status: Former    Current packs/day: 0.00    Types: Cigarettes    Quit date: 2023    Years since quitting: 2.0   Smokeless tobacco: Current  Vaping Use   Vaping status: Never Used  Substance and Sexual Activity   Alcohol use: Yes    Alcohol/week: 12.0 standard drinks  of alcohol    Types: 12 Cans of beer per week    Comment: a day   Drug use: Not Currently    Types: Marijuana, Cocaine, Heroin    Comment: 02/14/2013 last use, heroine last use 11/21/14   Sexual activity: Yes    Birth control/protection: None  Other Topics Concern   Not on file  Social History Narrative   Not on file   Social Drivers of Health   Financial Resource Strain: Low Risk  (12/08/2022)   Overall Financial Resource Strain (CARDIA)    Difficulty of Paying Living Expenses: Not hard at all  Food Insecurity: No Food Insecurity (12/08/2022)   Hunger Vital Sign    Worried About Running Out of Food in the Last Year: Never true    Ran Out of Food in the Last Year: Never true  Transportation Needs: No Transportation Needs (12/08/2022)   PRAPARE - Administrator, Civil Service (Medical): No    Lack of Transportation (Non-Medical): No  Physical Activity: Sufficiently Active (12/08/2022)   Exercise Vital Sign    Days of Exercise per Week: 5 days    Minutes of Exercise per Session: 30 min  Stress: No Stress Concern Present (12/08/2022)   [employer] of Occupational Health - Occupational Stress Questionnaire    Feeling of Stress : Only a little  Social Connections: Unknown (12/15/2022)   Received from [facility]   Social Network    Social Network: Not on file  Recent Concern: Social Connections - Moderately Isolated (12/08/2022)   Social Connection and Isolation Panel [NHANES]    Frequency of Communication with Friends and Family: More than three times a week    Frequency of Social Gatherings with Friends and Family: More than three times a week    Attends Religious Services: More than 4 times per year    Active Member of [employer] or Organizations: No    Attends Banker Meetings: Never    Marital Status: Separated     Family History: The patient's family history includes Cancer in an other family member; Diabetes in an other family member; Hypertension  in an other family member; Stroke in an other family member. ROS:   Please see the history of present illness.    All 14 point review of systems negative except as described per history of present illness.  EKGs/Labs/Other Studies Reviewed:    The following studies were reviewed today:   EKG:  EKG Interpretation Date/Time:  Thursday March 11 2023 08:07:42 EST Ventricular Rate:  69 PR Interval:  158 QRS Duration:  80 QT Interval:  412 QTC Calculation: 441 R Axis:   76  Text Interpretation: Normal sinus rhythm with sinus arrhythmia Normal ECG Confirmed by Bernie Charleston [id number]) on 03/11/2023 8:22:42 AM    Recent Labs: 12/14/2022: ALT 24; BUN  13; Creatinine, Ser 0.69; Hemoglobin 12.5; Platelets 283; Potassium 4.1; Sodium 141  Recent Lipid Panel    Component Value Date/Time   CHOL 127 12/14/2022 1038   TRIG 96 12/14/2022 1038   HDL 50 12/14/2022 1038   CHOLHDL 2.5 12/14/2022 1038   LDLCALC 59 12/14/2022 1038    Physical Exam:    VS:  BP 122/88 (BP Location: Right Arm, Patient Position: Sitting, Cuff Size: Normal)   Pulse 69   Ht 5' 5 (1.651 m)   Wt 180 lb (81.6 kg)   LMP 02/11/2023 (Exact Date)   SpO2 99%   BMI 29.95 kg/m     Wt Readings from Last 3 Encounters:  03/11/23 180 lb (81.6 kg)  02/11/23 179 lb 9.6 oz (81.5 kg)  12/08/22 188 lb 3.2 oz (85.4 kg)     GEN:  Well nourished, well developed in no acute distress HEENT: Normal NECK: No JVD; No carotid bruits LYMPHATICS: No lymphadenopathy CARDIAC: RRR, no murmurs, no rubs, no gallops RESPIRATORY:  Clear to auscultation without rales, wheezing or rhonchi  ABDOMEN: Soft, non-tender, non-distended MUSCULOSKELETAL:  No edema; No deformity  SKIN: Warm and dry NEUROLOGIC:  Alert and oriented x 3 PSYCHIATRIC:  Normal affect   ASSESSMENT:    1. Chest pressure   2. Atypical chest pain   3. Palpitations   4. Morbid obesity (HCC)   5. OSA (obstructive sleep apnea)    PLAN:    In order of problems listed  above:  Chest pressure, atypical chest pain with a long discussion what to do with the situation.  She was found to have atherosclerosis of the abdominal artery which is obviously concerning etc. young age.  Will schedule her to have coronary CT angio to make sure she does not have an obstructive disease and pravastatin for a risk factors.  In the meantime ask her to start taking 1 baby aspirin  a single day. Palpitations: I will ask her to wear Zio patch for 2 weeks to see if she get any significant arrhythmia. Dyslipidemia I did review K PN which show me LDL 59 HDL 35 good cholesterol will wait for results of coronary CT angio calcium score to decide about all risk and HDL management of this problem. Smoking/vaping, alcohol abuse.  She understand that she need to quit and she is trying to work on it. Obstructive sleep apnea does not use CPAP mask, tolerated but now she lost her 20 pounds hopefully it will be better. Atherosclerosis of abdominal arctic valve surprising finding such a individual will stratify her better with above-mentioned test to decide how aggressive we need to be with management of this problem.   Medication Adjustments/Labs and Tests Ordered: Current medicines are reviewed at length with the patient today.  Concerns regarding medicines are outlined above.  Orders Placed This Encounter  Procedures   EKG 12-Lead   No orders of the defined types were placed in this encounter.   Signed, Lamar DOROTHA Fitch, MD, [facility] 03/11/2023 8:42 AM    [facility]

## 2023-03-22 ENCOUNTER — Encounter (HOSPITAL_COMMUNITY): Payer: Self-pay

## 2023-03-24 ENCOUNTER — Ambulatory Visit (HOSPITAL_COMMUNITY)
Admission: RE | Admit: 2023-03-24 | Discharge: 2023-03-24 | Disposition: A | Discharge status: Home / Self Care | Payer: Medicaid Other | Source: Ambulatory Visit | Attending: Cardiology | Admitting: Cardiology

## 2023-03-24 DIAGNOSIS — R072 Precordial pain: Secondary | ICD-10-CM | POA: Diagnosis not present

## 2023-03-24 MED ORDER — NITROGLYCERIN 0.4 MG SL SUBL
SUBLINGUAL_TABLET | SUBLINGUAL | Status: AC
  Filled 2023-03-24: qty 2

## 2023-03-24 MED ORDER — IOHEXOL 350 MG/ML SOLN
100.0000 mL | Freq: Once | INTRAVENOUS | Status: AC | PRN
  Administered 2023-03-24: 100 mL via INTRAVENOUS

## 2023-03-24 MED ORDER — NITROGLYCERIN 0.4 MG SL SUBL
0.8000 mg | SUBLINGUAL_TABLET | Freq: Once | SUBLINGUAL | Status: AC
  Administered 2023-03-24: 0.8 mg via SUBLINGUAL

## 2023-03-26 ENCOUNTER — Telehealth: Payer: Self-pay

## 2023-03-26 NOTE — Telephone Encounter (Signed)
Patient notified of results and verbalized understanding.

## 2023-03-26 NOTE — Telephone Encounter (Signed)
-----   Message from Gypsy Balsam sent at 03/26/2023  2:31 PM EST ----- Coronary CT angio showed absence of coronary artery disease

## 2023-04-08 ENCOUNTER — Telehealth: Payer: Self-pay

## 2023-04-08 NOTE — Telephone Encounter (Signed)
 Pt viewed monitor results on My Chart per Dr. Vanetta Shawl note. Routed to PCP.

## 2023-04-13 ENCOUNTER — Encounter: Payer: Self-pay | Admitting: Gastroenterology

## 2023-05-05 ENCOUNTER — Encounter

## 2023-05-10 ENCOUNTER — Ambulatory Visit: Payer: Medicaid Other | Admitting: Cardiology

## 2023-05-13 ENCOUNTER — Other Ambulatory Visit

## 2023-05-13 ENCOUNTER — Ambulatory Visit: Payer: Medicaid Other | Admitting: Gastroenterology

## 2023-05-13 ENCOUNTER — Encounter: Payer: Self-pay | Admitting: Gastroenterology

## 2023-05-13 VITALS — BP 110/70 | HR 63 | Ht 65.0 in | Wt 172.0 lb

## 2023-05-13 DIAGNOSIS — K760 Fatty (change of) liver, not elsewhere classified: Secondary | ICD-10-CM

## 2023-05-13 DIAGNOSIS — R1011 Right upper quadrant pain: Secondary | ICD-10-CM | POA: Diagnosis not present

## 2023-05-13 DIAGNOSIS — B182 Chronic viral hepatitis C: Secondary | ICD-10-CM

## 2023-05-13 DIAGNOSIS — R7401 Elevation of levels of liver transaminase levels: Secondary | ICD-10-CM | POA: Diagnosis not present

## 2023-05-13 DIAGNOSIS — L299 Pruritus, unspecified: Secondary | ICD-10-CM

## 2023-05-13 DIAGNOSIS — R7989 Other specified abnormal findings of blood chemistry: Secondary | ICD-10-CM

## 2023-05-13 DIAGNOSIS — F1911 Other psychoactive substance abuse, in remission: Secondary | ICD-10-CM

## 2023-05-13 DIAGNOSIS — F1090 Alcohol use, unspecified, uncomplicated: Secondary | ICD-10-CM

## 2023-05-13 LAB — CBC
HCT: 38.3 % (ref 36.0–46.0)
Hemoglobin: 12.4 g/dL (ref 12.0–15.0)
MCHC: 32.4 g/dL (ref 30.0–36.0)
MCV: 82.4 fl (ref 78.0–100.0)
Platelets: 323 10*3/uL (ref 150.0–400.0)
RBC: 4.65 Mil/uL (ref 3.87–5.11)
RDW: 15.7 % — ABNORMAL HIGH (ref 11.5–15.5)
WBC: 6.6 10*3/uL (ref 4.0–10.5)

## 2023-05-13 LAB — CBC WITH DIFFERENTIAL/PLATELET
Basophils Absolute: 0.1 10*3/uL (ref 0.0–0.1)
Basophils Relative: 0.8 % (ref 0.0–3.0)
Eosinophils Absolute: 0.3 10*3/uL (ref 0.0–0.7)
Eosinophils Relative: 4.4 % (ref 0.0–5.0)
HCT: 38.3 % (ref 36.0–46.0)
Hemoglobin: 12.4 g/dL (ref 12.0–15.0)
Lymphocytes Relative: 25.7 % (ref 12.0–46.0)
Lymphs Abs: 1.7 10*3/uL (ref 0.7–4.0)
MCHC: 32.4 g/dL (ref 30.0–36.0)
MCV: 82.4 fl (ref 78.0–100.0)
Monocytes Absolute: 0.4 10*3/uL (ref 0.1–1.0)
Monocytes Relative: 5.6 % (ref 3.0–12.0)
Neutro Abs: 4.2 10*3/uL (ref 1.4–7.7)
Neutrophils Relative %: 63.5 % (ref 43.0–77.0)
Platelets: 323 10*3/uL (ref 150.0–400.0)
RBC: 4.65 Mil/uL (ref 3.87–5.11)
RDW: 15.7 % — ABNORMAL HIGH (ref 11.5–15.5)
WBC: 6.6 10*3/uL (ref 4.0–10.5)

## 2023-05-13 LAB — COMPREHENSIVE METABOLIC PANEL
ALT: 19 U/L (ref 0–35)
AST: 19 U/L (ref 0–37)
Albumin: 4 g/dL (ref 3.5–5.2)
Alkaline Phosphatase: 67 U/L (ref 39–117)
BUN: 9 mg/dL (ref 6–23)
CO2: 27 meq/L (ref 19–32)
Calcium: 8.9 mg/dL (ref 8.4–10.5)
Chloride: 106 meq/L (ref 96–112)
Creatinine, Ser: 0.65 mg/dL (ref 0.40–1.20)
GFR: 110.39 mL/min (ref >60.00)
Glucose, Bld: 91 mg/dL (ref 70–99)
Potassium: 4.1 meq/L (ref 3.5–5.1)
Sodium: 141 meq/L (ref 135–145)
Total Bilirubin: 0.7 mg/dL (ref 0.2–1.2)
Total Protein: 6.8 g/dL (ref 6.0–8.3)

## 2023-05-13 LAB — IBC + FERRITIN
Ferritin: 6.6 ng/mL — ABNORMAL LOW (ref 10.0–291.0)
Iron: 55 ug/dL (ref 42–145)
Saturation Ratios: 11.7 % — ABNORMAL LOW (ref 20.0–50.0)
TIBC: 471.8 ug/dL — ABNORMAL HIGH (ref 250.0–450.0)
Transferrin: 337 mg/dL (ref 212.0–360.0)

## 2023-05-13 LAB — PROTIME-INR
INR: 1.1 ratio — ABNORMAL HIGH (ref 0.8–1.0)
Prothrombin Time: 11.2 s (ref 9.6–13.1)

## 2023-05-13 LAB — LIPASE: Lipase: 13 U/L (ref 11.0–59.0)

## 2023-05-13 NOTE — Progress Notes (Signed)
 Chief Complaint: RUQ pain Primary GI [id number]  HPI: 38 year old female history of bipolar, hepatitis C s/p reported spontaneous clearance, prior substance abuse, and others as listed below presents for evaluation of RUQ pain.  Prior GI history in [place] last seen in 2021.  Per chart review has had prior workup with RUQ ultrasound showing hepatomegaly, HIDA normal, CTAB normal, family history of grandfather with esophagus cancer, last visit with previous GI she was having epigastric pain that radiated to the back and amitriptyline helped some. She underwent EGD which was negative.  Recently seen by PCP and stated she has history of a liver mass that needs imaging q98months so PCP ordered CT for further evaluation.  She had MRI January 2024 which showed 2 cm lesion in left hepatic lobe consistent with focal fatty sparing surrounded by rim of increased fatty infiltration.  Repeat MRI March 2024 showed no lesion.  CT abdomen pelvis with contrast 02/21/2023 shows diffuse hepatic steatosis.  No discrete lesion.  S/p cholecystectomy. Gastric bypass.  Aortic atherosclerosis.  Labs 12/14/2022 AST 22, ALT 24, alk phos 96 (stable 10 years ago as well) No anemia  Labs 05/08/2022 No anemia, normal platelets AST 68, ALT 77, alk phos 81 Total bilirubin 0.4 Lipase 22 CRP normal  -----------TODAY-------------------------   Patient presents with RUQ pain and itching.  She has experienced persistent right upper quadrant pain for the past five years, radiating to her back. The pain is constant, sometimes exacerbated by movement, and is not related to eating. Significant itching is localized to the right side, along the ribs and extending to the back. Previous imaging showed a fatty liver but no lesions.   She has a history of elevated liver enzymes reportedly, although the most recent tests from October were normal. She also has a history of hepatitis C that resolved spontaneously.  She  underwent bariatric surgery, specifically gastric bypass, which has resulted in worsened nausea and occasional heartburn. She previously used Protonix for heartburn but has since discontinued all medications. Occasional light-colored stools and significant gas or constipation have been noted post-surgery. No changes in bowel habits or rectal bleeding. No nausea or vomiting related to eating, but nausea worsened post-bariatric surgery.  She consumes a pint or more of alcohol daily, with a history of on-and-off drinking for several years. No withdrawal symptoms are reported during periods of abstinence.  She is scheduled to see behavioral therapy to consider Antabuse to stop her alcohol intake.     PREVIOUS GI WORKUP   MRI abdomen 05/2022 No acute findings - Hepatic steatosis with areas of focal fatty sparing along the gallbladder fossa less conspicuous on current study.  No new or suspicious hepatic lesions.  MRI abdomen 03/04/2022 shows  hepatic steatosis.  2 cm lesion in central left hepatic lobe with features most consistent of focal fatty sparing surrounded by a rim of increased fatty infiltration.  Conceivably an atypical focal nodule liver hyperplasia could produce the apparent delayed enhancement.  Follow-up 6 months.  Additional area of apparent focal fatty sparing is noted in the anterior medial right hepatic lobe  EGD 08/2019 for epigastric pain - normal esophagus - erythematous mucosa in the antrum - normal examined duodenum - biopsies: mild reactive gastropathy (h pylori status not mentioned)  07/05/2019 RUQ U/S:  FINDINGS:  The liver measures 20.4 cm and appears normal. The gallbladder  appears normal. The common bile duct measures 4 mm. No intrahepatic ductal  dilatation is seen.   07/27/19 HIDA:  FINDINGS: There is  prompt uptake of radiotracer to the liver and gallbladder.  Following fatty liquid meal, the gallbladder ejection fraction is calculated at  69%. Normal is considered  35% or higher.   07/24/19 CT A/P with contrast:  Pancreas:  No focal lesions Stomach, duodenum, small bowel demonstrates no thickening or dilatation. Terminal ileum is grossly normal. There is a prominent ileocecal valve likely normal variant.. There is a moderate amount stool seen in the colon. The appendix is not visualized. There are no secondary signs of appendicitis seen. 1. Mild hepatosplenomegaly. 2. Moderate amount stool in the colon.   Past Medical History:  Diagnosis Date   Alcoholism (HCC)    Anxiety    Bipolar 1 disorder (HCC)    COPD (chronic obstructive pulmonary disease) (HCC)    Hepatitis    Hep C   Obesity     Past Surgical History:  Procedure Laterality Date   CESAREAN SECTION     x 4   CHOLECYSTECTOMY     GASTRIC BYPASS     REMOVAL OF EAR TUBE     TEMPOROMANDIBULAR JOINT ARTHROPLASTY     TONSILLECTOMY AND ADENOIDECTOMY     TUBAL LIGATION      Current Outpatient Medications  Medication Sig Dispense Refill   albuterol (VENTOLIN HFA) 108 (90 Base) MCG/ACT inhaler Inhale 2 puffs into the lungs every 6 (six) hours as needed for wheezing or shortness of breath. 8 g 0   aspirin EC 81 MG tablet Take 1 tablet (81 mg total) by mouth daily. Swallow whole.     No current facility-administered medications for this visit.    Allergies as of 05/13/2023 - Review Complete 05/13/2023  Allergen Reaction Noted   Brassica oleracea Anaphylaxis, Itching, Swelling, and Other (See Comments) 02/12/2012   Tylenol [acetaminophen] Palpitations 02/11/2023   Codeine Itching 02/12/2012    Family History  Problem Relation Age of Onset   Ovarian cancer Mother    Heart disease Maternal Grandmother    Heart disease Maternal Grandfather    Breast cancer Paternal Grandmother    Esophageal cancer Paternal Grandfather    Hypertension Other    Diabetes Other    Cancer Other    Stroke Other    Breast cancer Paternal Aunt     Social History   Socioeconomic History   Marital  status: Married    Spouse name: Not on file   Number of children: 4   Years of education: Not on file   Highest education level: High school graduate  Occupational History   Occupation: Surveyor, mining  Tobacco Use   Smoking status: Former    Current packs/day: 0.00    Types: Cigarettes    Quit date: 2023    Years since quitting: 2.1   Smokeless tobacco: Never  Vaping Use   Vaping status: Every Day   Substances: Nicotine  Substance and Sexual Activity   Alcohol use: Not Currently    Comment: a pint or more a day -beer and liquor   Drug use: Not Currently    Types: Marijuana, Cocaine, Heroin    Comment: 02/14/2013 last use, heroine last use 11/21/14   Sexual activity: Yes    Birth control/protection: None  Other Topics Concern   Not on file  Social History Narrative   Not on file   Social Drivers of Health   Financial Resource Strain: Low Risk  (12/08/2022)   Overall Financial Resource Strain (CARDIA)    Difficulty of Paying Living Expenses: Not hard at all  Food  Insecurity: No Food Insecurity (12/08/2022)   Hunger Vital Sign    Worried About Running Out of Food in the Last Year: Never true    Ran Out of Food in the Last Year: Never true  Transportation Needs: No Transportation Needs (12/08/2022)   PRAPARE - Administrator, Civil Service (Medical): No    Lack of Transportation (Non-Medical): No  Physical Activity: Sufficiently Active (12/08/2022)   Exercise Vital Sign    Days of Exercise per Week: 5 days    Minutes of Exercise per Session: 30 min  Stress: No Stress Concern Present (12/08/2022)   [employer] of Occupational Health - Occupational Stress Questionnaire    Feeling of Stress : Only a little  Social Connections: Unknown (12/15/2022)   [facility]   Social Network    Social Network: Not on file  Recent Concern: Social Connections - Moderately Isolated (12/08/2022)   Social Connection and Isolation Panel [NHANES]    Frequency  of Communication with Friends and Family: More than three times a week    Frequency of Social Gatherings with Friends and Family: More than three times a week    Attends Religious Services: More than 4 times per year    Active Member of [employer] or Organizations: No    Attends Banker Meetings: Never    Marital Status: Separated  Intimate Partner Violence: Unknown (12/15/2022)   Received from [facility]   HITS    Physically Hurt: Not on file    Insult or Talk Down To: Not on file    Threaten Physical Harm: Not on file    Scream or Curse: Not on file    Review of Systems:    Constitutional: No weight loss, fever, chills, weakness or fatigue HEENT: Eyes: No change in vision               Ears, Nose, Throat:  No change in hearing or congestion Skin: No rash or itching Cardiovascular: No chest pain, chest pressure or palpitations   Respiratory: No SOB or cough Gastrointestinal: See HPI and otherwise negative Genitourinary: No dysuria or change in urinary frequency Neurological: No headache, dizziness or syncope Musculoskeletal: No new muscle or joint pain Hematologic: No bleeding or bruising Psychiatric: No history of depression or anxiety    Physical Exam:  Vital signs: BP 110/70   Pulse 63   Ht 5\' 5"  (1.651 m)   Wt 172 lb (78 kg)   BMI 28.62 kg/m   Constitutional: NAD, Well developed, Well nourished, alert and cooperative Head:  Normocephalic and atraumatic. Eyes:   PEERL, EOMI. No icterus. Conjunctiva pink. Respiratory: Respirations even and unlabored. Lungs clear to auscultation bilaterally.   No wheezes, crackles, or rhonchi.  Cardiovascular:  Regular rate and rhythm. No peripheral edema, cyanosis or pallor.  Gastrointestinal:  Soft, nondistended, nontender. No rebound or guarding. Normal bowel sounds. No appreciable masses or hepatomegaly. Rectal:  Not performed.  Msk:  Symmetrical without gross deformities. Without edema, no deformity or joint  abnormality.  Neurologic:  Alert and  oriented x4;  grossly normal neurologically.  Skin:   Dry and intact without significant lesions or rashes. Psychiatric: Oriented to person, place and time. Demonstrates good judgement and reason without abnormal affect or behaviors.   RELEVANT LABS AND IMAGING: CBC    Component Value Date/Time   WBC 6.6 12/14/2022 1038   WBC 11.2 (H) 04/13/2013 1324   RBC 4.52 12/14/2022 1038   RBC 5.25 (H) 04/13/2013 1324  HGB 12.5 12/14/2022 1038   HCT 40.7 12/14/2022 1038   PLT 283 12/14/2022 1038   MCV 90 12/14/2022 1038   MCH 27.7 12/14/2022 1038   MCH 26.7 04/13/2013 1324   MCHC 30.7 (L) 12/14/2022 1038   MCHC 32.7 04/13/2013 1324   RDW 14.2 12/14/2022 1038   LYMPHSABS 1.5 12/14/2022 1038   MONOABS 1.4 (H) 04/13/2013 1324   EOSABS 0.4 12/14/2022 1038   BASOSABS 0.1 12/14/2022 1038    CMP     Component Value Date/Time   NA 141 12/14/2022 1038   K 4.1 12/14/2022 1038   CL 105 12/14/2022 1038   CO2 22 12/14/2022 1038   GLUCOSE 86 12/14/2022 1038   GLUCOSE 112 (H) 04/13/2013 1324   BUN 13 12/14/2022 1038   CREATININE 0.69 12/14/2022 1038   CALCIUM 9.2 12/14/2022 1038   PROT 7.3 12/14/2022 1038   ALBUMIN 4.3 12/14/2022 1038   AST 22 12/14/2022 1038   ALT 24 12/14/2022 1038   ALKPHOS 96 12/14/2022 1038   BILITOT 0.5 12/14/2022 1038   GFRNONAA 85 (L) 04/13/2013 1324   GFRAA >90 04/13/2013 1324     Assessment/Plan:      Chronic right upper quadrant pain Chronic pain with pruritus (no rash on PE), s/p cholecystectomy. Worse with movement LFTs not compatible with cholestasis. Differential includes musculoskeletal pain or hepatic pathology. Previous imaging showed hepatic steatosis on CT 01/2023 and MRI abdomen 05/2022.  EGD in 2021 for this pain was unrevealing. - Could consider MRCP for further evaluation.  Though she did recently have MRI abdomen 1 year ago. low suspicion for postcholecystectomy choledocholithiasis as LFTs do not appear  obstructive at this time.  Will await lab workup before considering further imaging - more suspicious for musculoskeletal etiology as worsening with movement and not associated with eating with extensive negative workup including imaging and endoscopic evaluation  Elevated LFTs History of hepatic steatosis, heavy alcohol use, and elevated AST/ALT with normal platelets and normal bilirubin.  Suspect her elevated LFTs are secondary to fatty liver but she does reportedly have a history of hep C as well. - Full serologic workup including ASMA, AMA, ANA, ceruloplasmin, alpha-1 antitrypsin, iron studies, PT/INR - HCVRNA, hep B surface antigen, hep B core antibody, hep B surface antibody, hep A antibody total  Alcohol use disorder Consumes a pint or more of alcohol daily. No withdrawal symptoms. Referred to behavioral health for management. Discussed disulfiram prescription possibility. - Encourage follow-up with behavioral health for alcohol use disorder management.  Resolved hepatitis C Remote hepatitis C reportedly cleared. Previous testing confirmed clearance. Offered re-evaluation. - Order hepatitis C antibody test to confirm clearance.  Assigned to Dr. Adela Lank today  Boone Master, [name] [facility] 05/13/2023, 11:33 AM  Cc: Rema Fendt, NP

## 2023-05-13 NOTE — Progress Notes (Signed)
 Agree with assessment and plan as outlined. Let's await LFTs but given prior imaging she has had, I think MRCP likely lower yield. This sounds more so c/w musculoskeletal type pain. Agree with further workup of her liver enzymes otherwise.

## 2023-05-13 NOTE — Patient Instructions (Signed)
 Your provider has requested that you go to the basement level for lab work before leaving today. Press "B" on the elevator. The lab is located at the first door on the left as you exit the elevator.  Due to recent changes in healthcare laws, you may see the results of your imaging and laboratory studies on MyChart before your provider has had a chance to review them.  We understand that in some cases there may be results that are confusing or concerning to you. Not all laboratory results come back in the same time frame and the provider may be waiting for multiple results in order to interpret others.  Please give [place] 48 hours in order for your provider to thoroughly review all the results before contacting the office for clarification of your results.   Thank you for trusting me with your gastrointestinal care!   Boone Master, PA

## 2023-05-15 LAB — HCV RNA QUANT: Hepatitis C Quantitation: NOT DETECTED [IU]/mL

## 2023-05-18 LAB — ANTI-MICROSOMAL ANTIBODY LIVER / KIDNEY: LKM1 Ab: <=20 U (ref <=20.0)

## 2023-05-18 LAB — HEPATITIS B SURFACE ANTIBODY,QUALITATIVE: Hep B S Ab: REACTIVE — AB

## 2023-05-18 LAB — HEPATITIS A ANTIBODY, TOTAL: Hepatitis A AB,Total: REACTIVE — AB

## 2023-05-18 LAB — HEPATITIS B CORE ANTIBODY, TOTAL: Hep B Core Total Ab: NONREACTIVE

## 2023-05-18 LAB — ANTI-SMOOTH MUSCLE ANTIBODY, IGG: Actin (Smooth Muscle) Antibody (IGG): <20 U (ref <20)

## 2023-05-18 LAB — HEPATITIS B SURFACE ANTIGEN: Hepatitis B Surface Ag: NONREACTIVE

## 2023-05-18 LAB — ANA: Anti Nuclear Antibody (ANA): NEGATIVE

## 2023-05-18 LAB — CERULOPLASMIN: Ceruloplasmin: 26 mg/dL (ref 14–48)

## 2023-05-28 ENCOUNTER — Telehealth: Admitting: Physician Assistant

## 2023-05-28 DIAGNOSIS — J029 Acute pharyngitis, unspecified: Secondary | ICD-10-CM | POA: Diagnosis not present

## 2023-05-28 DIAGNOSIS — H9209 Otalgia, unspecified ear: Secondary | ICD-10-CM

## 2023-05-28 MED ORDER — AMOXICILLIN-POT CLAVULANATE 875-125 MG PO TABS: 1.0000 | ORAL_TABLET | Freq: Two times a day (BID) | ORAL | 0 refills | Status: AC

## 2023-05-28 NOTE — Progress Notes (Signed)
 E-Visit for Sore Throat - Strep Symptoms  We are sorry that you are not feeling well.  Here is how we plan to help!  Based on what you have shared with me it is likely that you have strep pharyngitis.  Strep pharyngitis is inflammation and infection in the back of the throat.  This is an infection cause by bacteria and is treated with antibiotics.  I have prescribed Augmentin twice daily for 10 days.. For throat pain, we recommend over the counter oral pain relief medications such as acetaminophen or aspirin, or anti-inflammatory medications such as ibuprofen or naproxen sodium. Topical treatments such as oral throat lozenges or sprays may be used as needed. Strep infections are not as easily transmitted as other respiratory infections, however we still recommend that you avoid close contact with loved ones, especially the very young and elderly.  Remember to wash your hands thoroughly throughout the day as this is the number one way to prevent the spread of infection and wipe down door knobs and counters with disinfectant.   Home Care: Only take medications as instructed by your medical team. Complete the entire course of an antibiotic. Do not take these medications with alcohol. A steam or ultrasonic humidifier can help congestion.  You can place a towel over your head and breathe in the steam from hot water coming from a faucet. Avoid close contacts especially the very young and the elderly. Cover your mouth when you cough or sneeze. Always remember to wash your hands.  Get Help Right Away If: You develop worsening fever or sinus pain. You develop a severe head ache or visual changes. Your symptoms persist after you have completed your treatment plan.  Make sure you Understand these instructions. Will watch your condition. Will get help right away if you are not doing well or get worse.   Thank you for choosing an e-visit.  Your e-visit answers were reviewed by a board certified  advanced clinical practitioner to complete your personal care plan. Depending upon the condition, your plan could have included both over the counter or prescription medications.  Please review your pharmacy choice. Make sure the pharmacy is open so you can pick up prescription now. If there is a problem, you may contact your provider through [employer] and have the prescription routed to another pharmacy.  Your safety is important to [place]. If you have drug allergies check your prescription carefully.   For the next 24 hours you can use MyChart to ask questions about today's visit, request a non-urgent call back, or ask for a work or school excuse. You will get an email in the next two days asking about your experience. I hope that your e-visit has been valuable and will speed your recovery.  Approximately 5 minutes was spent documenting and reviewing patient's chart.

## 2023-05-31 ENCOUNTER — Encounter: Payer: Self-pay | Admitting: Obstetrics and Gynecology

## 2023-05-31 ENCOUNTER — Other Ambulatory Visit (HOSPITAL_COMMUNITY)
Admission: RE | Admit: 2023-05-31 | Discharge: 2023-05-31 | Disposition: A | Discharge status: Home / Self Care | Source: Ambulatory Visit | Attending: Obstetrics and Gynecology | Admitting: Obstetrics and Gynecology

## 2023-05-31 ENCOUNTER — Ambulatory Visit: Payer: Self-pay | Admitting: Obstetrics and Gynecology

## 2023-05-31 VITALS — BP 120/72 | HR 75 | Temp 98.3°F | Ht 65.25 in | Wt 168.8 lb

## 2023-05-31 DIAGNOSIS — Z23 Encounter for immunization: Secondary | ICD-10-CM

## 2023-05-31 DIAGNOSIS — N898 Other specified noninflammatory disorders of vagina: Secondary | ICD-10-CM | POA: Diagnosis not present

## 2023-05-31 DIAGNOSIS — Z01419 Encounter for gynecological examination (general) (routine) without abnormal findings: Secondary | ICD-10-CM | POA: Diagnosis not present

## 2023-05-31 DIAGNOSIS — Z113 Encounter for screening for infections with a predominantly sexual mode of transmission: Secondary | ICD-10-CM | POA: Diagnosis present

## 2023-05-31 DIAGNOSIS — Z1331 Encounter for screening for depression: Secondary | ICD-10-CM

## 2023-05-31 DIAGNOSIS — Z7251 High risk heterosexual behavior: Secondary | ICD-10-CM | POA: Diagnosis not present

## 2023-05-31 DIAGNOSIS — Z124 Encounter for screening for malignant neoplasm of cervix: Secondary | ICD-10-CM | POA: Insufficient documentation

## 2023-05-31 LAB — WET PREP FOR TRICH, YEAST, CLUE

## 2023-05-31 NOTE — Assessment & Plan Note (Signed)
 Cervical cancer screening performed according to ASCCP guidelines. Encouraged annual mammogram screening Labs and immunizations with her primary Encouraged safe sexual practices as indicated Encouraged healthy lifestyle practices with diet and exercise For patients under 40yo, I recommend 1000mg  calcium daily and 600IU of vitamin D daily.

## 2023-05-31 NOTE — Progress Notes (Unsigned)
 38 y.o. [id number] female s/p BTL, chronic hep C, thank you here for annual exam. Married- separated due to husband's drug use.  From [place] originally, moved back from [place] approximately 1 year ago.  Manager at [employer].  Patient's last menstrual period was 05/25/2023 (approximate).  Hx of UTI and BV. Desire STD testing. 4 partners since Dec, not using condoms.  Abnormal bleeding: none Pelvic discharge or pain: none Breast mass, nipple discharge or skin changes : none Birth control: BTL Last PAP: No results found for: "DIAGPAP", "HPVHIGH", "ADEQPAP" Last mammogram: 06/23/2022 [facility] 1 Last colonoscopy: Never Sexually active: Yes Exercising: No Smoker: Yes, vapes PHQ-9: 3  GYN HISTORY: Prior C-section x4 and BTL  OB History  Gravida Para Term Preterm AB Living  5 4 4  1 4   SAB IAB Ectopic Multiple Live Births   1   4    # Outcome Date GA Lbr Len/2nd Weight Sex Type Anes PTL Lv  5 Term      CS-Classical   LIV  4 IAB           3 Term      CS-Classical   DEC  2 Term      CS-Classical   LIV  1 Term      CS-Classical   LIV    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 02/14/2019   Last Assessment & Plan:    Patient is a 38 yo [id number] at [redacted]w[redacted]d with a pregnancy complicated by multiple fetal anomalies, potentially compromising the airway, admitted for extended monitoring and steroids in the setting of BPP 6/10 on day of admission at the MFM office       -  BMZ#1 on 12/15 at  2203, BMZ#2 on 12/16 at 1009   - FHT: category I throughout inpatient admission   - 12/16: BPP 8/8     Past Surgical History:  Procedure Laterality Date   CESAREAN SECTION     x 4   CHOLECYSTECTOMY     GASTRIC BYPASS     REMOVAL OF EAR TUBE     TEMPOROMANDIBULAR JOINT ARTHROPLASTY     TONSILLECTOMY AND ADENOIDECTOMY     TUBAL LIGATION      Current Outpatient Medications on File Prior to Visit  Medication Sig Dispense Refill   albuterol (VENTOLIN HFA) 108 (90 Base) MCG/ACT inhaler Inhale 2 puffs into the lungs every 6 (six) hours as needed for wheezing or shortness of breath. 8 g 0   amoxicillin-clavulanate (AUGMENTIN) 875-125 MG tablet Take 1 tablet by mouth 2 (two) times daily for 10 days. 20 tablet 0   aspirin EC 81 MG tablet Take 1 tablet (81 mg total) by mouth daily. Swallow whole.     IBUPROFEN PO Take by mouth.     No current facility-administered medications on file prior to visit.    Social History   Socioeconomic History  Marital status: Legally Separated    Spouse name: Not on file   Number of children: 4   Years of education: Not on file   Highest education level: High school graduate  Occupational History   Occupation: Surveyor, mining  Tobacco Use   Smoking status: Every Day    Current packs/day: 0.00    Types: Cigarettes    Last attempt to quit: 2023    Years since quitting: 2.2   Smokeless tobacco: Current  Vaping Use   Vaping status: Every Day   Substances: Nicotine  Substance and Sexual Activity   Alcohol use: Yes    Comment: a pint or more a day -beer and liquor   Drug use: Not Currently    Types: Marijuana, Cocaine, Heroin    Comment: 02/14/2013 last use, heroine last use 11/21/14   Sexual activity: Yes    Birth control/protection: None  Other Topics Concern   Not on file  Social History Narrative   Not on file   Social Drivers of Health   Financial Resource Strain: Low Risk  (12/08/2022)   Overall Financial Resource Strain (CARDIA)     Difficulty of Paying Living Expenses: Not hard at all  Food Insecurity: No Food Insecurity (12/08/2022)   Hunger Vital Sign    Worried About Running Out of Food in the Last Year: Never true    Ran Out of Food in the Last Year: Never true  Transportation Needs: No Transportation Needs (12/08/2022)   PRAPARE - Administrator, Civil Service (Medical): No    Lack of Transportation (Non-Medical): No  Physical Activity: Sufficiently Active (12/08/2022)   Exercise Vital Sign    Days of Exercise per Week: 5 days    Minutes of Exercise per Session: 30 min  Stress: No Stress Concern Present (12/08/2022)   [employer] of Occupational Health - Occupational Stress Questionnaire    Feeling of Stress : Only a little  Social Connections: Unknown (12/15/2022)   Received from [facility]   Social Network    Social Network: Not on file  Recent Concern: Social Connections - Moderately Isolated (12/08/2022)   Social Connection and Isolation Panel [NHANES]    Frequency of Communication with Friends and Family: More than three times a week    Frequency of Social Gatherings with Friends and Family: More than three times a week    Attends Religious Services: More than 4 times per year    Active Member of [employer] or Organizations: No    Attends Banker Meetings: Never    Marital Status: Separated  Intimate Partner Violence: Unknown (12/15/2022)   Received from [facility]   HITS    Physically Hurt: Not on file    Insult or Talk Down To: Not on file    Threaten Physical Harm: Not on file    Scream or Curse: Not on file    Family History  Problem Relation Age of Onset   Ovarian cancer Mother    Heart disease Maternal Grandmother    Heart disease Maternal Grandfather    Breast cancer Paternal Grandmother    Esophageal cancer Paternal Grandfather    Hypertension Other    Diabetes Other    Cancer Other    Stroke Other    Breast cancer Paternal Aunt     Allergies   Allergen Reactions   Brassica Oleracea Anaphylaxis, Itching, Swelling and Other (See Comments)    Only raw broccoli causes reaction:  Tongue swelling with raw broccoli  Tongue swelling with raw broccoli   Other reaction(s): Other- (not listed) - Allergy  Tongue swelling with raw broccoli   Tylenol [Acetaminophen] Palpitations   Codeine Itching      PE Today's Vitals   05/31/23 1120  BP: 120/72  Pulse: 75  Temp: 98.3 F (36.8 C)  TempSrc: Oral  SpO2: 99%  Weight: 168 lb 12.8 oz (76.6 kg)  Height: 5' 5.25" (1.657 m)   Body mass index is 27.87 kg/m.  Physical Exam Vitals reviewed. Exam conducted with a chaperone present.  Constitutional:      General: She is not in acute distress.    Appearance: Normal appearance.  HENT:     Head: Normocephalic and atraumatic.     Nose: Nose normal.  Eyes:     Extraocular Movements: Extraocular movements intact.     Conjunctiva/sclera: Conjunctivae normal.  Neck:     Thyroid: No thyroid mass, thyromegaly or thyroid tenderness.  Pulmonary:     Effort: Pulmonary effort is normal.  Chest:     Chest wall: No mass or tenderness.  Breasts:    Right: Normal. No swelling, mass, nipple discharge, skin change or tenderness.     Left: Normal. No swelling, mass, nipple discharge, skin change or tenderness.  Abdominal:     General: There is no distension.     Palpations: Abdomen is soft.     Tenderness: There is no abdominal tenderness.  Genitourinary:    General: Normal vulva.     Exam position: Lithotomy position.     Urethra: No prolapse.     Vagina: Normal. No vaginal discharge or bleeding.     Cervix: Normal. No lesion.     Uterus: Normal. Not enlarged and not tender.      Adnexa: Right adnexa normal and left adnexa normal.        Comments: Cyst vs polyp of posterior cervix Musculoskeletal:        General: Normal range of motion.     Cervical back: Normal range of motion.  Lymphadenopathy:     Upper Body:     Right upper  body: No axillary adenopathy.     Left upper body: No axillary adenopathy.     Lower Body: No right inguinal adenopathy. No left inguinal adenopathy.  Skin:    General: Skin is warm and dry.  Neurological:     General: No focal deficit present.     Mental Status: She is alert.  Psychiatric:        Mood and Affect: Mood normal.        Behavior: Behavior normal.       Assessment and Plan:        Well woman exam with routine gynecological exam Assessment & Plan: Cervical cancer screening performed according to ASCCP guidelines. Encouraged annual mammogram screening Labs and immunizations with her primary Encouraged safe sexual practices as indicated Encouraged healthy lifestyle practices with diet and exercise For patients under 50yo, I recommend 1000mg  calcium daily and 600IU of vitamin D daily.    Cervical cancer screening -     Cytology - PAP  Need for HPV vaccination  Screen for STD (sexually transmitted disease) -     Cytology - PAP -     RPR -     HIV Antibody (routine testing w rflx)  Vaginal discharge -     WET PREP FOR TRICH, YEAST, CLUE  High risk heterosexual behavior  Offered PREP, patient declines at this time.  Jakyron Fabro Lasandra Beech,  MD

## 2023-06-02 LAB — CYTOLOGY - PAP
Chlamydia: NEGATIVE
Comment: NEGATIVE
Comment: NEGATIVE
Comment: NORMAL
Diagnosis: NEGATIVE
High risk HPV: NEGATIVE
Neisseria Gonorrhea: NEGATIVE

## 2023-06-03 ENCOUNTER — Encounter: Payer: Self-pay | Admitting: Obstetrics and Gynecology

## 2023-06-04 NOTE — Patient Instructions (Signed)

## 2023-06-30 ENCOUNTER — Telehealth: Payer: Self-pay | Admitting: Family

## 2023-06-30 NOTE — Telephone Encounter (Addendum)
 Pt is requesting a referral to get a panniculectomy procedure. Please advise.  Pt scheduled for appt 05/05

## 2023-06-30 NOTE — Telephone Encounter (Signed)
 Patient scheduled.

## 2023-06-30 NOTE — Telephone Encounter (Signed)
 Schedule appointment?

## 2023-07-05 ENCOUNTER — Encounter: Payer: Self-pay | Admitting: Family

## 2023-07-05 ENCOUNTER — Ambulatory Visit (INDEPENDENT_AMBULATORY_CARE_PROVIDER_SITE_OTHER): Admitting: Family

## 2023-07-05 ENCOUNTER — Other Ambulatory Visit: Payer: Self-pay | Admitting: Family

## 2023-07-05 ENCOUNTER — Other Ambulatory Visit (HOSPITAL_COMMUNITY)
Admission: RE | Admit: 2023-07-05 | Discharge: 2023-07-05 | Disposition: A | Discharge status: Home / Self Care | Source: Ambulatory Visit | Attending: Family | Admitting: Family

## 2023-07-05 VITALS — BP 116/79 | HR 82 | Temp 98.6°F | Resp 16 | Ht 65.0 in | Wt 169.0 lb

## 2023-07-05 DIAGNOSIS — E65 Localized adiposity: Secondary | ICD-10-CM

## 2023-07-05 DIAGNOSIS — R399 Unspecified symptoms and signs involving the genitourinary system: Secondary | ICD-10-CM | POA: Insufficient documentation

## 2023-07-05 DIAGNOSIS — N3000 Acute cystitis without hematuria: Secondary | ICD-10-CM

## 2023-07-05 LAB — POCT URINALYSIS DIP (CLINITEK)
Bilirubin, UA: NEGATIVE
Blood, UA: NEGATIVE
Glucose, UA: 100 mg/dL — AB
Ketones, POC UA: NEGATIVE mg/dL
Leukocytes, UA: NEGATIVE
Nitrite, UA: POSITIVE — AB
Spec Grav, UA: >=1.03 — AB (ref 1.010–1.025)
Urobilinogen, UA: 1 U/dL
pH, UA: 5.5 (ref 5.0–8.0)

## 2023-07-05 MED ORDER — NITROFURANTOIN MONOHYD MACRO 100 MG PO CAPS: 100.0000 mg | ORAL_CAPSULE | Freq: Two times a day (BID) | ORAL | 0 refills | Status: AC

## 2023-07-05 NOTE — Progress Notes (Signed)
 Referral and burning with urination

## 2023-07-05 NOTE — Progress Notes (Signed)
 Patient ID: Kathy Burns, female    DOB: December 09, 1985  MRN: [id number]  CC: Referral  Subjective: Kathy Burns is a 38 y.o. female who presents for referral.   Her concerns today include:  - Requests referral for panniculectomy. States she has lost 130 pounds. States history of 4 cesarean sections and panniculus causing discomfort and burning.  - Reports burning with urination. Denies additional symptoms.   Patient Active Problem List   Diagnosis Date Noted   Well woman exam with routine gynecological exam 05/31/2023   Atypical chest pain 03/11/2023   Palpitations 03/11/2023   Admission for palliative care 10/22/2022   RLS (restless legs syndrome) 11/19/2021   OSA (obstructive sleep apnea) 07/29/2021   COPD, moderate (HCC) 04/03/2021   Gastroesophageal reflux disease without esophagitis 02/04/2021   History of substance abuse (HCC) 02/04/2021   Morbid obesity (HCC) 02/04/2021   Chronic RUQ pain 10/25/2019   Fetal anomaly necessitating delivery 02/27/2019   Bipolar disorder, in partial remission, most recent episode mixed (HCC) 06/15/2016   Chronic hepatitis C without hepatic coma (HCC) 06/15/2016   Heavy smoker 06/15/2016   Herpes simplex antibody positive 06/15/2016   Cellulitis 02/15/2013   Polysubstance abuse (HCC) 02/15/2013     Current Outpatient Medications on File Prior to Visit  Medication Sig Dispense Refill   albuterol  (VENTOLIN  HFA) 108 (90 Base) MCG/ACT inhaler Inhale 2 puffs into the lungs every 6 (six) hours as needed for wheezing or shortness of breath. 8 g 0   aspirin  EC 81 MG tablet Take 1 tablet (81 mg total) by mouth daily. Swallow whole. (Patient not taking: Reported on 07/05/2023)     IBUPROFEN PO Take by mouth. (Patient not taking: Reported on 07/05/2023)     No current facility-administered medications on file prior to visit.    Allergies  Allergen Reactions   Brassica Oleracea Anaphylaxis, Itching, Swelling and Other (See Comments)    Only raw  broccoli causes reaction:  Tongue swelling with raw broccoli   Tongue swelling with raw broccoli   Other reaction(s): Other- (not listed) - Allergy  Tongue swelling with raw broccoli   Tylenol  [Acetaminophen ] Palpitations   Codeine Itching    Social History   Socioeconomic History   Marital status: Legally Separated    Spouse name: Not on file   Number of children: 4   Years of education: Not on file   Highest education level: High school graduate  Occupational History   Occupation: Surveyor, mining  Tobacco Use   Smoking status: Every Day    Current packs/day: 0.00    Types: Cigarettes    Last attempt to quit: 2023    Years since quitting: 2.3   Smokeless tobacco: Current  Vaping Use   Vaping status: Every Day   Substances: Nicotine  Substance and Sexual Activity   Alcohol use: Yes    Comment: a pint or more a day -beer and liquor   Drug use: Not Currently    Types: Marijuana, Cocaine, Heroin    Comment: 02/14/2013 last use, heroine last use 11/21/14   Sexual activity: Yes    Birth control/protection: None  Other Topics Concern   Not on file  Social History Narrative   Not on file   Social Drivers of Health   Financial Resource Strain: Low Risk  (12/08/2022)   Overall Financial Resource Strain (CARDIA)    Difficulty of Paying Living Expenses: Not hard at all  Food Insecurity: No Food Insecurity (12/08/2022)   Hunger Vital  Sign    Worried About Programme researcher, broadcasting/film/video in the Last Year: Never true    Ran Out of Food in the Last Year: Never true  Transportation Needs: No Transportation Needs (12/08/2022)   PRAPARE - Administrator, Civil Service (Medical): No    Lack of Transportation (Non-Medical): No  Physical Activity: Sufficiently Active (12/08/2022)   Exercise Vital Sign    Days of Exercise per Week: 5 days    Minutes of Exercise per Session: 30 min  Stress: No Stress Concern Present (12/08/2022)   [employer] of Occupational Health -  Occupational Stress Questionnaire    Feeling of Stress : Only a little  Social Connections: Unknown (12/15/2022)   Received from [facility]   Social Network    Social Network: Not on file  Recent Concern: Social Connections - Moderately Isolated (12/08/2022)   Social Connection and Isolation Panel [NHANES]    Frequency of Communication with Friends and Family: More than three times a week    Frequency of Social Gatherings with Friends and Family: More than three times a week    Attends Religious Services: More than 4 times per year    Active Member of [employer] or Organizations: No    Attends Banker Meetings: Never    Marital Status: Separated  Intimate Partner Violence: Unknown (12/15/2022)   Received from [facility]   HITS    Physically Hurt: Not on file    Insult or Talk Down To: Not on file    Threaten Physical Harm: Not on file    Scream or Curse: Not on file    Family History  Problem Relation Age of Onset   Ovarian cancer Mother    Heart disease Maternal Grandmother    Heart disease Maternal Grandfather    Breast cancer Paternal Grandmother    Esophageal cancer Paternal Grandfather    Hypertension Other    Diabetes Other    Cancer Other    Stroke Other    Breast cancer Paternal Aunt     Past Surgical History:  Procedure Laterality Date   CESAREAN SECTION     x 4   CHOLECYSTECTOMY     GASTRIC BYPASS     REMOVAL OF EAR TUBE     TEMPOROMANDIBULAR JOINT ARTHROPLASTY     TONSILLECTOMY AND ADENOIDECTOMY     TUBAL LIGATION      ROS: Review of Systems Negative except as stated above  PHYSICAL EXAM: BP 116/79   Pulse 82   Temp 98.6 F (37 C) (Oral)   Resp 16   Ht 5\' 5"  (1.651 m)   Wt 169 lb (76.7 kg)   LMP 06/21/2023 (Approximate)   SpO2 99%   BMI 28.12 kg/m   Physical Exam HENT:     Head: Normocephalic and atraumatic.     Nose: Nose normal.     Mouth/Throat:     Mouth: Mucous membranes are moist.     Pharynx: Oropharynx is clear.   Eyes:     Extraocular Movements: Extraocular movements intact.     Conjunctiva/sclera: Conjunctivae normal.     Pupils: Pupils are equal, round, and reactive to light.  Cardiovascular:     Rate and Rhythm: Normal rate and regular rhythm.     Pulses: Normal pulses.     Heart sounds: Normal heart sounds.  Pulmonary:     Effort: Pulmonary effort is normal.     Breath sounds: Normal breath sounds.  Abdominal:  General: Bowel sounds are normal.     Palpations: Abdomen is soft.     Comments: Panniculus.  Musculoskeletal:        General: Normal range of motion.     Cervical back: Normal range of motion and neck supple.  Neurological:     General: No focal deficit present.     Mental Status: She is alert and oriented to person, place, and time.  Psychiatric:        Mood and Affect: Mood normal.        Behavior: Behavior normal.      ASSESSMENT AND PLAN: 1. Panniculus (Primary) - Referral to Plastic Surgery for evaluation/management. - Ambulatory referral to Plastic Surgery  2. Lower urinary tract symptoms (LUTS) - Routine screening.  - POCT URINALYSIS DIP (CLINITEK); Future - Cervicovaginal ancillary only   Patient was given the opportunity to ask questions.  Patient verbalized understanding of the plan and was able to repeat key elements of the plan. Patient was given clear instructions to go to Emergency Department or return to medical center if symptoms don't improve, worsen, or new problems develop.The patient verbalized understanding.   Orders Placed This Encounter  Procedures   Ambulatory referral to Plastic Surgery   POCT URINALYSIS DIP (CLINITEK)   Follow-up with primary provider as scheduled.  Senaida Dama, NP

## 2023-07-06 ENCOUNTER — Other Ambulatory Visit: Payer: Self-pay | Admitting: Family

## 2023-07-06 DIAGNOSIS — B3731 Acute candidiasis of vulva and vagina: Secondary | ICD-10-CM

## 2023-07-06 DIAGNOSIS — N760 Acute vaginitis: Secondary | ICD-10-CM

## 2023-07-06 LAB — CERVICOVAGINAL ANCILLARY ONLY
Bacterial Vaginitis (gardnerella): POSITIVE — AB
Candida Glabrata: POSITIVE — AB
Candida Vaginitis: NEGATIVE
Chlamydia: NEGATIVE
Comment: NEGATIVE
Comment: NEGATIVE
Comment: NEGATIVE
Comment: NEGATIVE
Comment: NEGATIVE
Comment: NORMAL
Neisseria Gonorrhea: NEGATIVE
Trichomonas: NEGATIVE

## 2023-07-06 MED ORDER — FLUCONAZOLE 150 MG PO TABS: 150.0000 mg | ORAL_TABLET | ORAL | 0 refills | Status: AC

## 2023-07-06 MED ORDER — METRONIDAZOLE 500 MG PO TABS
500.0000 mg | ORAL_TABLET | Freq: Two times a day (BID) | ORAL | 0 refills | Status: AC
Start: 2023-07-06 — End: 2023-07-13

## 2023-07-07 ENCOUNTER — Telehealth: Admitting: Family Medicine

## 2023-07-07 DIAGNOSIS — R059 Cough, unspecified: Secondary | ICD-10-CM

## 2023-07-07 MED ORDER — BENZONATATE 100 MG PO CAPS: 100.0000 mg | ORAL_CAPSULE | Freq: Three times a day (TID) | ORAL | 0 refills | Status: DC | PRN

## 2023-07-07 NOTE — Progress Notes (Signed)
E-Visit for Cough  We are sorry that you are not feeling well.  Here is how we plan to help!  Based on your presentation I believe you most likely have A cough due to a virus.  This is called viral bronchitis and is best treated by rest, plenty of fluids and control of the cough.  You may use Ibuprofen or Tylenol as directed to help your symptoms.     In addition you may use A prescription cough medication called Tessalon Perles 100mg. You may take 1-2 capsules every 8 hours as needed for your cough.   From your responses in the eVisit questionnaire you describe inflammation in the upper respiratory tract which is causing a significant cough.  This is commonly called Bronchitis and has four common causes:   Allergies Viral Infections Acid Reflux Bacterial Infection Allergies, viruses and acid reflux are treated by controlling symptoms or eliminating the cause. An example might be a cough caused by taking certain blood pressure medications. You stop the cough by changing the medication. Another example might be a cough caused by acid reflux. Controlling the reflux helps control the cough.  USE OF BRONCHODILATOR ("RESCUE") INHALERS: There is a risk from using your bronchodilator too frequently.  The risk is that over-reliance on a medication which only relaxes the muscles surrounding the breathing tubes can reduce the effectiveness of medications prescribed to reduce swelling and congestion of the tubes themselves.  Although you feel brief relief from the bronchodilator inhaler, your asthma may actually be worsening with the tubes becoming more swollen and filled with mucus.  This can delay other crucial treatments, such as oral steroid medications. If you need to use a bronchodilator inhaler daily, several times per day, you should discuss this with your provider.  There are probably better treatments that could be used to keep your asthma under control.     HOME CARE Only take medications as  instructed by your medical team. Complete the entire course of an antibiotic. Drink plenty of fluids and get plenty of rest. Avoid close contacts especially the very young and the elderly Cover your mouth if you cough or cough into your sleeve. Always remember to wash your hands A steam or ultrasonic humidifier can help congestion.   GET HELP RIGHT AWAY IF: You develop worsening fever. You become short of breath You cough up blood. Your symptoms persist after you have completed your treatment plan MAKE SURE YOU  Understand these instructions. Will watch your condition. Will get help right away if you are not doing well or get worse.    Thank you for choosing an e-visit.  Your e-visit answers were reviewed by a board certified advanced clinical practitioner to complete your personal care plan. Depending upon the condition, your plan could have included both over the counter or prescription medications.  Please review your pharmacy choice. Make sure the pharmacy is open so you can pick up prescription now. If there is a problem, you may contact your provider through MyChart messaging and have the prescription routed to another pharmacy.  Your safety is important to us. If you have drug allergies check your prescription carefully.   For the next 24 hours you can use MyChart to ask questions about today's visit, request a non-urgent call back, or ask for a work or school excuse. You will get an email in the next two days asking about your experience. I hope that your e-visit has been valuable and will speed your recovery.    I provided 5 minutes of non face-to-face time during this encounter for chart review, medication and order placement, as well as and documentation.   . 

## 2023-07-13 ENCOUNTER — Ambulatory Visit: Payer: Self-pay

## 2023-07-13 NOTE — Telephone Encounter (Signed)
  Chief Complaint: UTI follow up Symptoms: burning with urination, urinary urgency Frequency: x 1 week Pertinent Negatives: Patient denies fever, back or flank pain, blood in urine, vaginal discharge, nausea, vomiting Disposition: [] ED /[] Urgent Care (no appt availability in office) / [] Appointment(In office/virtual)/ []  [place] [facility]/ [] Home Care/ [] Refused Recommended Disposition /[] [place] [facility]/ [x]  Follow-up with PCP Additional Notes: Patient states she has been taking AZO OTC. Patient states symptoms feel "the same" and has completed her 7 day course of Macrobid . No available appointments with PCP or at [facility] clinic. Advised patient to go to urgent care if any symptoms worsen or for new symptoms. Called [facility] and spoke with staff.  Copied from CRM [id number]. Topic: Clinical - Medication Question >> Jul 13, 2023  8:59 AM Everlene Hobby D wrote: Patient doesn't think the antibiotic she was given for the UTI is working and wants to know if she can get something else. Reason for Disposition  [1] Taking antibiotic > 72 hours (3 days) for UTI AND [2] painful urination or frequency is SAME (unchanged, not better)  Answer Assessment - Initial Assessment Questions 1. MAIN SYMPTOM: "What is the main symptom you are concerned about?" (e.g., painful urination, urine frequency)     Burning with urination, urinary urgency  2. BETTER-SAME-WORSE: "Are you getting better, staying the same, or getting worse compared to how you felt at your last visit to the doctor (most recent medical visit)?"     The same.  3. PAIN: "How bad is the pain?"  (e.g., Scale 1-10; mild, moderate, or severe)   - MILD (1-3): complains slightly about urination hurting   - MODERATE (4-7): interferes with normal activities     - SEVERE (8-10): excruciating, unwilling or unable to urinate because of the pain      Mild to moderate.  4. FEVER: "Do you have a fever?" If Yes, ask: "What is it, how was it measured, and when did  it start?"     Denies.  5. OTHER SYMPTOMS: "Do you have any other symptoms?" (e.g., blood in the urine, flank pain, vaginal discharge)     Denies.  6. DIAGNOSIS: "When was the UTI diagnosed?" "By whom?" "Was it a kidney infection, bladder infection or both?"     07/05/23, NP Rogerio Clay. Bladder infection.  7. ANTIBIOTIC: "What antibiotic(s) are you taking?" "How many times per day?"     Macrobid , twice daily.  8. ANTIBIOTIC - START DATE: "When did you start taking the antibiotic?"     5/6 and has completed.  Protocols used: Urinary Tract Infection on Antibiotic Follow-up Call - [facility]

## 2023-07-13 NOTE — Telephone Encounter (Signed)
 Schedule appointment. During the interim for immediate medical evaluation report to Emergency Department/Urgent Care/call 911.

## 2023-07-13 NOTE — Telephone Encounter (Signed)
 [name], do you need patient to come  back in for retest?

## 2023-07-15 ENCOUNTER — Ambulatory Visit: Payer: Self-pay | Admitting: Family

## 2023-07-15 NOTE — Telephone Encounter (Signed)
 Chief Complaint:  Patient called back- Patient has UTI s/ s have continued despite completing full round of antibiotics.   Symptoms:  Urinary freq/ urgency/burning  Frequency:   Pertinent Negatives:  Fever/flank pain/blood in urine.   Patient denies  Fever/flank pain/blood in urine.   Disposition: [ ] ED /[ ] Urgent Care (no appt availability in office) /  [ X]Appointment(In office/virtual)/ [ ]  [place] [facility]/ [ ] Home Care/ [ ] Refused Recommended Disposition /[ ] [place] [facility]/ [ ]  Follow-up with PCP   Additional Notes:  No available appts, patient warmed transferred to [facility] for assistance/appointment    Complete triage note below:     Copied from CRM #[id number]. Topic: Clinical - Red Word Triage >> Jul 15, 2023  1:02 PM Rachelle R wrote: [facility] that prompted transfer to Nurse Triage: Patient states she thinks she has a UTI, has burning with she urinates and the frequency to go has increased. Reason for Disposition  Urinating more frequently than usual (i.e., frequency)  Answer Assessment - Initial Assessment Questions 1. SYMPTOM: "What's the main symptom you're concerned about?" (e.g., frequency, incontinence)  Frequency, burning     2. ONSET: "When did the  *No Answer*  start?"      Original UTI started 7 days ago, still having symptoms after completing antibiotics.   3. PAIN: "Is there any pain?" If Yes, ask: "How bad is it?" (Scale: 1-10; mild, moderate, severe)    N/a   4. CAUSE: "What do you think is causing the symptoms?"     N/a  5. OTHER SYMPTOMS: "Do you have any other symptoms?" (e.g., blood in urine, fever, flank pain, pain with urination)     N/A  Protocols used: Urinary Symptoms-A-AH

## 2023-07-15 NOTE — Telephone Encounter (Signed)
 she said she feels a little better if over the weekend she starts feeling bad she will call us  monday

## 2023-07-15 NOTE — Telephone Encounter (Signed)
 Per scheduler: she said she feels a little better if over the weekend she starts feeling bad she will call us  monday

## 2023-07-16 ENCOUNTER — Ambulatory Visit: Payer: Self-pay | Admitting: Family

## 2023-07-16 ENCOUNTER — Encounter: Payer: Self-pay | Admitting: Family

## 2023-07-16 ENCOUNTER — Ambulatory Visit: Admitting: Family

## 2023-07-16 VITALS — BP 125/76 | HR 75 | Temp 98.3°F | Resp 18 | Ht 65.0 in | Wt 174.4 lb

## 2023-07-16 DIAGNOSIS — R399 Unspecified symptoms and signs involving the genitourinary system: Secondary | ICD-10-CM

## 2023-07-16 LAB — POCT URINALYSIS DIP (CLINITEK)
Glucose, UA: NEGATIVE mg/dL
Leukocytes, UA: NEGATIVE
Nitrite, UA: NEGATIVE
POC PROTEIN,UA: >=300 — AB
Spec Grav, UA: >=1.03 — AB (ref 1.010–1.025)
Urobilinogen, UA: 1 U/dL
pH, UA: 6 (ref 5.0–8.0)

## 2023-07-16 NOTE — Progress Notes (Signed)
 Patient ID: Kathy Burns, female    DOB: 14-Jan-1986  MRN: [id number]  CC: Urinary Symptoms  Subjective: Kathy Burns is a 38 y.o. female who presents for urinary symptoms.   Her concerns today include:  Reports discomfort during urination. Denies red flag symptoms. Declines cervicovaginal ancillary testing.  Patient Active Problem List   Diagnosis Date Noted   Well woman exam with routine gynecological exam 05/31/2023   Atypical chest pain 03/11/2023   Palpitations 03/11/2023   Admission for palliative care 10/22/2022   RLS (restless legs syndrome) 11/19/2021   OSA (obstructive sleep apnea) 07/29/2021   COPD, moderate (HCC) 04/03/2021   Gastroesophageal reflux disease without esophagitis 02/04/2021   History of substance abuse (HCC) 02/04/2021   Morbid obesity (HCC) 02/04/2021   Chronic RUQ pain 10/25/2019   Fetal anomaly necessitating delivery 02/27/2019   Bipolar disorder, in partial remission, most recent episode mixed (HCC) 06/15/2016   Chronic hepatitis C without hepatic coma (HCC) 06/15/2016   Heavy smoker 06/15/2016   Herpes simplex antibody positive 06/15/2016   Cellulitis 02/15/2013   Polysubstance abuse (HCC) 02/15/2013     Current Outpatient Medications on File Prior to Visit  Medication Sig Dispense Refill   albuterol  (VENTOLIN  HFA) 108 (90 Base) MCG/ACT inhaler Inhale 2 puffs into the lungs every 6 (six) hours as needed for wheezing or shortness of breath. 8 g 0   benzonatate  (TESSALON ) 100 MG capsule Take 1 capsule (100 mg total) by mouth 3 (three) times daily as needed for cough. 30 capsule 0   aspirin  EC 81 MG tablet Take 1 tablet (81 mg total) by mouth daily. Swallow whole. (Patient not taking: Reported on 07/05/2023)     IBUPROFEN PO Take by mouth. (Patient not taking: Reported on 07/05/2023)     No current facility-administered medications on file prior to visit.    Allergies  Allergen Reactions   Brassica Oleracea Anaphylaxis, Itching, Swelling and  Other (See Comments)    Only raw broccoli causes reaction:  Tongue swelling with raw broccoli   Tongue swelling with raw broccoli   Other reaction(s): Other- (not listed) - Allergy  Tongue swelling with raw broccoli   Tylenol  [Acetaminophen ] Palpitations   Codeine Itching    Social History   Socioeconomic History   Marital status: Legally Separated    Spouse name: Not on file   Number of children: 4   Years of education: Not on file   Highest education level: High school graduate  Occupational History   Occupation: Surveyor, mining  Tobacco Use   Smoking status: Every Day    Current packs/day: 0.00    Types: Cigarettes    Last attempt to quit: 2023    Years since quitting: 2.3   Smokeless tobacco: Current  Vaping Use   Vaping status: Every Day   Substances: Nicotine  Substance and Sexual Activity   Alcohol use: Yes    Comment: a pint or more a day -beer and liquor   Drug use: Not Currently    Types: Marijuana, Cocaine, Heroin    Comment: 02/14/2013 last use, heroine last use 11/21/14   Sexual activity: Yes    Birth control/protection: None  Other Topics Concern   Not on file  Social History Narrative   Not on file   Social Drivers of Health   Financial Resource Strain: Low Risk  (12/08/2022)   Overall Financial Resource Strain (CARDIA)    Difficulty of Paying Living Expenses: Not hard at all  Food Insecurity: No  Food Insecurity (12/08/2022)   Hunger Vital Sign    Worried About Running Out of Food in the Last Year: Never true    Ran Out of Food in the Last Year: Never true  Transportation Needs: No Transportation Needs (12/08/2022)   PRAPARE - Administrator, Civil Service (Medical): No    Lack of Transportation (Non-Medical): No  Physical Activity: Sufficiently Active (12/08/2022)   Exercise Vital Sign    Days of Exercise per Week: 5 days    Minutes of Exercise per Session: 30 min  Stress: No Stress Concern Present (12/08/2022)   [employer]  of Occupational Health - Occupational Stress Questionnaire    Feeling of Stress : Only a little  Social Connections: Unknown (12/15/2022)   Received from [facility]   Social Network    Social Network: Not on file  Recent Concern: Social Connections - Moderately Isolated (12/08/2022)   Social Connection and Isolation Panel [NHANES]    Frequency of Communication with Friends and Family: More than three times a week    Frequency of Social Gatherings with Friends and Family: More than three times a week    Attends Religious Services: More than 4 times per year    Active Member of [employer] or Organizations: No    Attends Banker Meetings: Never    Marital Status: Separated  Intimate Partner Violence: Unknown (12/15/2022)   Received from [facility]   HITS    Physically Hurt: Not on file    Insult or Talk Down To: Not on file    Threaten Physical Harm: Not on file    Scream or Curse: Not on file    Family History  Problem Relation Age of Onset   Ovarian cancer Mother    Heart disease Maternal Grandmother    Heart disease Maternal Grandfather    Breast cancer Paternal Grandmother    Esophageal cancer Paternal Grandfather    Hypertension Other    Diabetes Other    Cancer Other    Stroke Other    Breast cancer Paternal Aunt     Past Surgical History:  Procedure Laterality Date   CESAREAN SECTION     x 4   CHOLECYSTECTOMY     GASTRIC BYPASS     REMOVAL OF EAR TUBE     TEMPOROMANDIBULAR JOINT ARTHROPLASTY     TONSILLECTOMY AND ADENOIDECTOMY     TUBAL LIGATION      ROS: Review of Systems Negative except as stated above  PHYSICAL EXAM: BP 125/76   Pulse 75   Temp 98.3 F (36.8 C) (Oral)   Resp 18   Ht 5\' 5"  (1.651 m)   Wt 174 lb 6.4 oz (79.1 kg)   LMP 07/16/2023 (Approximate)   SpO2 97%   BMI 29.02 kg/m   Physical Exam HENT:     Head: Normocephalic and atraumatic.     Nose: Nose normal.     Mouth/Throat:     Mouth: Mucous membranes are moist.      Pharynx: Oropharynx is clear.  Eyes:     Extraocular Movements: Extraocular movements intact.     Conjunctiva/sclera: Conjunctivae normal.     Pupils: Pupils are equal, round, and reactive to light.  Cardiovascular:     Rate and Rhythm: Normal rate and regular rhythm.     Pulses: Normal pulses.     Heart sounds: Normal heart sounds.  Pulmonary:     Effort: Pulmonary effort is normal.     Breath  sounds: Normal breath sounds.  Musculoskeletal:        General: Normal range of motion.     Cervical back: Normal range of motion and neck supple.  Neurological:     General: No focal deficit present.     Mental Status: She is alert and oriented to person, place, and time.  Psychiatric:        Mood and Affect: Mood normal.        Behavior: Behavior normal.     ASSESSMENT AND PLAN: 1. Lower urinary tract symptoms (LUTS) (Primary) - Routine screening.  - POCT URINALYSIS DIP (CLINITEK); Future - Urine Culture - POCT URINALYSIS DIP (CLINITEK)     Patient was given the opportunity to ask questions.  Patient verbalized understanding of the plan and was able to repeat key elements of the plan. Patient was given clear instructions to go to Emergency Department or return to medical center if symptoms don't improve, worsen, or new problems develop.The patient verbalized understanding.   Orders Placed This Encounter  Procedures   Urine Culture   POCT URINALYSIS DIP (CLINITEK)   Follow-up with primary provider as scheduled.  Senaida Dama, NP

## 2023-07-16 NOTE — Progress Notes (Signed)
 Still feels like she has a uti

## 2023-07-18 LAB — URINE CULTURE

## 2023-07-29 ENCOUNTER — Ambulatory Visit: Admitting: Plastic Surgery

## 2023-07-29 ENCOUNTER — Telehealth: Admitting: Physician Assistant

## 2023-07-29 ENCOUNTER — Encounter: Payer: Self-pay | Admitting: Plastic Surgery

## 2023-07-29 VITALS — BP 117/78 | HR 74 | Ht 65.5 in | Wt 171.8 lb

## 2023-07-29 DIAGNOSIS — M793 Panniculitis, unspecified: Secondary | ICD-10-CM

## 2023-07-29 DIAGNOSIS — E65 Localized adiposity: Secondary | ICD-10-CM | POA: Diagnosis not present

## 2023-07-29 DIAGNOSIS — Z9884 Bariatric surgery status: Secondary | ICD-10-CM | POA: Diagnosis not present

## 2023-07-29 DIAGNOSIS — N390 Urinary tract infection, site not specified: Secondary | ICD-10-CM | POA: Diagnosis not present

## 2023-07-29 MED ORDER — SULFAMETHOXAZOLE-TRIMETHOPRIM 800-160 MG PO TABS: 1.0000 | ORAL_TABLET | Freq: Two times a day (BID) | ORAL | 0 refills | Status: DC

## 2023-07-29 NOTE — Progress Notes (Signed)
 E-Visit for Urinary Problems  We are sorry that you are not feeling well.  Here is how we plan to help!  Based on what you shared with me it looks like you most likely have a simple urinary tract infection.  A UTI (Urinary Tract Infection) is a bacterial infection of the bladder.  Most cases of urinary tract infections are simple to treat but a key part of your care is to encourage you to drink plenty of fluids and watch your symptoms carefully.  I have prescribed Bactrim DS One tablet twice a day for 5 days.  Your symptoms should gradually improve. Call us if the burning in your urine worsens, you develop worsening fever, back pain or pelvic pain or if your symptoms do not resolve after completing the antibiotic.  Urinary tract infections can be prevented by drinking plenty of water to keep your body hydrated.  Also be sure when you wipe, wipe from front to back and don't hold it in!  If possible, empty your bladder every 4 hours.  HOME CARE Drink plenty of fluids Compete the full course of the antibiotics even if the symptoms resolve Remember, when you need to go.go. Holding in your urine can increase the likelihood of getting a UTI! GET HELP RIGHT AWAY IF: You cannot urinate You get a high fever Worsening back pain occurs You see blood in your urine You feel sick to your stomach or throw up You feel like you are going to pass out  MAKE SURE YOU  Understand these instructions. Will watch your condition. Will get help right away if you are not doing well or get worse.   Thank you for choosing an e-visit.  Your e-visit answers were reviewed by a board certified advanced clinical practitioner to complete your personal care plan. Depending upon the condition, your plan could have included both over the counter or prescription medications.  Please review your pharmacy choice. Make sure the pharmacy is open so you can pick up prescription now. If there is a problem, you may contact  your provider through [employer] and have the prescription routed to another pharmacy.  Your safety is important to [place]. If you have drug allergies check your prescription carefully.   For the next 24 hours you can use MyChart to ask questions about today's visit, request a non-urgent call back, or ask for a work or school excuse. You will get an email in the next two days asking about your experience. I hope that your e-visit has been valuable and will speed your recovery.    I have spent 5 minutes in review of e-visit questionnaire, review and updating patient chart, medical decision making and response to patient.   Margaretann Loveless, [name]

## 2023-07-29 NOTE — Progress Notes (Signed)
 Referring Provider Senaida Dama, NP [address] [place],  [place] [address]   CC:  Chief Complaint  Patient presents with   Advice Only      Kathy Burns is an 38 y.o. female.  HPI: Kathy Burns is a 38 year old female who underwent bariatric surgery in November 2023.  She has lost approximately 125 pounds.  She now has excess skin on the anterior portion of her abdomen which she states pulls on her C-section incisions and causes discomfort.  Additionally she states it is difficult to find close which fit appropriately.  She has rashes under the pannus which require treatment with nystatin powder.  Patient was seen by cardiology for palpitations and atypical chest pain.  She was taking aspirin  but is no longer taking this.  She is interested in a panniculectomy.  Allergies  Allergen Reactions   Brassica Oleracea Anaphylaxis, Itching, Swelling and Other (See Comments)    Only raw broccoli causes reaction:  Tongue swelling with raw broccoli   Tongue swelling with raw broccoli   Other reaction(s): Other- (not listed) - Allergy  Tongue swelling with raw broccoli   Tylenol  [Acetaminophen ] Palpitations   Codeine Itching    Outpatient Encounter Medications as of 07/29/2023  Medication Sig   albuterol  (VENTOLIN  HFA) 108 (90 Base) MCG/ACT inhaler Inhale 2 puffs into the lungs every 6 (six) hours as needed for wheezing or shortness of breath.   IBUPROFEN PO Take by mouth.   aspirin  EC 81 MG tablet Take 1 tablet (81 mg total) by mouth daily. Swallow whole. (Patient not taking: Reported on 07/29/2023)   benzonatate  (TESSALON ) 100 MG capsule Take 1 capsule (100 mg total) by mouth 3 (three) times daily as needed for cough. (Patient not taking: Reported on 07/29/2023)   No facility-administered encounter medications on file as of 07/29/2023.     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 02/14/2019   Last Assessment & Plan:    Patient is a 38 yo G4P3003 at [redacted]w[redacted]d with a pregnancy complicated by multiple fetal anomalies, potentially compromising the airway, admitted for extended monitoring and steroids in the setting of BPP 6/10 on day of admission at the MFM office       -  BMZ#1 on 12/15 at 2203, BMZ#2 on 12/16 at 1009   - FHT: category I throughout inpatient admission   - 12/16: BPP 8/8     Past Surgical History:  Procedure Laterality Date   CESAREAN SECTION     x 4   CHOLECYSTECTOMY     GASTRIC BYPASS     REMOVAL OF EAR TUBE     TEMPOROMANDIBULAR JOINT ARTHROPLASTY     TONSILLECTOMY AND ADENOIDECTOMY     TUBAL LIGATION      Family History  Problem Relation Age of Onset   Ovarian cancer Mother    Heart disease Maternal Grandmother  Heart disease Maternal Grandfather    Breast cancer Paternal Grandmother    Esophageal cancer Paternal Grandfather    Hypertension Other    Diabetes Other    Cancer Other    Stroke Other    Breast cancer Paternal Aunt     Social History   Social History Narrative   Not on file     Review of Systems General: Denies fevers, chills, weight loss CV: Denies chest pain, shortness of breath, palpitations Abdomen: Excess skin and fat on the anterior abdominal wall which interferes with daily activities, personal hygiene, finding clothes that fit appropriately, often has rashes which require treatment.  Physical Exam    07/29/2023    1:19 PM 07/16/2023    3:20 PM 07/05/2023    3:09 PM  Vitals with BMI  Height 5' 5.5" 5\' 5"  5'  5"  Weight 171 lbs 13 oz 174 lbs 6 oz 169 lbs  BMI 28.14 29.02 28.12  Systolic 117 125 161  Diastolic 78 76 79  Pulse 74 75 82    General:  No acute distress,  Alert and oriented, Non-Toxic, Normal speech and affect Abdomen: Patient has a modest pannus which extends past the symphysis pubis.  The skin around her C-section incision is in the pulled by the weight of the pannus.  Initially I was concerned that she had a small hernia superior to the umbilicus but review of a CT scan done within the past year did not show any abdominal wall defect. Mammogram: April 2024 BI-RADS 1 Assessment/Plan Pan[place]: Patient with discomfort from the weight of her pannus and intermittent rashes.  She would benefit from a panniculectomy.  I discussed the procedure at length with her including showing her the location of the incisions and we discussed the unpredictable nature of scarring and wound healing.  We discussed the risks of bleeding, infection, and seroma formation.  She understands she will have drains that she will have in place for 1 to 4 weeks.  She will need to wear compressive garment for 6 weeks.  We discussed the increased risk of wound healing complications in patients that have undergone massive weight loss.  We discussed postoperative limitations including no heavy lifting greater than 20 pounds, no vigorous activity, no submerging incisions in water for 6 weeks.  She may return to light activity as tolerated.  She will be encouraged to begin ambulation immediately after surgery to help decrease the risk of DVT.  All questions were answered to her satisfaction.  Photographs were obtained today with her consent.  Will schedule her for panniculectomy at her request.  Kathy Burns 07/29/2023, 4:26 PM

## 2023-08-03 ENCOUNTER — Telehealth: Payer: Self-pay

## 2023-08-03 NOTE — Telephone Encounter (Signed)
   Pre-operative Risk Assessment    Patient Name: Kathy Burns  DOB: 30-Sep-1985 MRN: [id number]   Date of last office visit: 03/11/2023 Date of next office visit: None   Request for Surgical Clearance    Procedure:  Panniculectomy  Date of Surgery:  Clearance TBD                                 Surgeon:  Dr. Larraine Plush Surgeon's Group or Practice Name:  [facility] Phone number:  [phone] Fax number:  [phone]   Type of Clearance Requested:   - Medical  - Pharmacy:  Hold Not specified      Type of Anesthesia:  General    Additional requests/questions:  Asking for instructions on any meds pre or post surgery.   Signed, Lloyde Ludlam M Agapita Savarino   08/03/2023, 4:53 PM

## 2023-08-04 ENCOUNTER — Telehealth: Payer: Self-pay

## 2023-08-04 NOTE — Telephone Encounter (Signed)
   Name: Kathy Burns  DOB: [birth year]  MRN: [id number]  Primary Cardiologist: None   Preoperative team, please contact this patient and set up a phone call appointment for further preoperative risk assessment. Please obtain consent and complete medication review. Thank you for your help.  I confirm that guidance regarding antiplatelet and oral anticoagulation therapy has been completed and, if necessary, noted below.  Patient can hold ASA 81 mg 5 to 7 days prior to procedure and should restart at the discretion of performing provider.  I also confirmed the patient resides in the state of [place] . As per [facility] Medical Board telemedicine laws, the patient must reside in the state in which the provider is licensed.   Francene Ing, Retha Cast, NP 08/04/2023, 7:47 AM [facility]

## 2023-08-04 NOTE — Telephone Encounter (Signed)
 Patient agreeable with virtual telehealth appointment. Medication list and consent have both been reviewed. Patient voiced understanding.

## 2023-08-04 NOTE — Telephone Encounter (Signed)
  Patient Consent for Virtual Visit         Kathy Burns has provided verbal consent on 08/04/2023 for a virtual visit (video or telephone).   CONSENT FOR VIRTUAL VISIT FOR:  Kathy Burns  By participating in this virtual visit I agree to the following:  I hereby voluntarily request, consent and authorize [facility] and its employed or contracted physicians, physician assistants, nurse practitioners or other licensed health care professionals (the Practitioner), to provide me with telemedicine health care services (the "Services") as deemed necessary by the treating Practitioner. I acknowledge and consent to receive the Services by the Practitioner via telemedicine. I understand that the telemedicine visit will involve communicating with the Practitioner through live audiovisual communication technology and the disclosure of certain medical information by electronic transmission. I acknowledge that I have been given the opportunity to request an in-person assessment or other available alternative prior to the telemedicine visit and am voluntarily participating in the telemedicine visit.  I understand that I have the right to withhold or withdraw my consent to the use of telemedicine in the course of my care at any time, without affecting my right to future care or treatment, and that the Practitioner or I may terminate the telemedicine visit at any time. I understand that I have the right to inspect all information obtained and/or recorded in the course of the telemedicine visit and may receive copies of available information for a reasonable fee.  I understand that some of the potential risks of receiving the Services via telemedicine include:  Delay or interruption in medical evaluation due to technological equipment failure or disruption; Information transmitted may not be sufficient (e.g. poor resolution of images) to allow for appropriate medical decision making by the Practitioner;  and/or  In rare instances, security protocols could fail, causing a breach of personal health information.  Furthermore, I acknowledge that it is my responsibility to provide information about my medical history, conditions and care that is complete and accurate to the best of my ability. I acknowledge that Practitioner's advice, recommendations, and/or decision may be based on factors not within their control, such as incomplete or inaccurate data provided by me or distortions of diagnostic images or specimens that may result from electronic transmissions. I understand that the practice of medicine is not an exact science and that Practitioner makes no warranties or guarantees regarding treatment outcomes. I acknowledge that a copy of this consent can be made available to me via my patient portal [facility] MyChart), or I can request a printed copy by calling the office of [facility].    I understand that my insurance will be billed for this visit.   I have read or had this consent read to me. I understand the contents of this consent, which adequately explains the benefits and risks of the Services being provided via telemedicine.  I have been provided ample opportunity to ask questions regarding this consent and the Services and have had my questions answered to my satisfaction. I give my informed consent for the services to be provided through the use of telemedicine in my medical care

## 2023-08-06 ENCOUNTER — Ambulatory Visit
Admission: RE | Admit: 2023-08-06 | Discharge: 2023-08-06 | Disposition: A | Discharge status: Home / Self Care | Payer: Self-pay | Source: Ambulatory Visit | Attending: Family Medicine | Admitting: Family Medicine

## 2023-08-06 VITALS — BP 106/66 | HR 83 | Temp 98.0°F | Resp 18 | Ht 65.5 in | Wt 170.0 lb

## 2023-08-06 DIAGNOSIS — R300 Dysuria: Secondary | ICD-10-CM | POA: Diagnosis present

## 2023-08-06 DIAGNOSIS — N760 Acute vaginitis: Secondary | ICD-10-CM | POA: Diagnosis present

## 2023-08-06 LAB — POCT URINALYSIS DIP (MANUAL ENTRY)
Blood, UA: NEGATIVE
Glucose, UA: 100 mg/dL — AB
Ketones, POC UA: NEGATIVE mg/dL
Leukocytes, UA: NEGATIVE
Nitrite, UA: POSITIVE — AB
Protein Ur, POC: 30 mg/dL — AB
Spec Grav, UA: >=1.03 — AB (ref 1.010–1.025)
Urobilinogen, UA: 1 U/dL
pH, UA: 5 (ref 5.0–8.0)

## 2023-08-06 MED ORDER — FLUCONAZOLE 150 MG PO TABS: 150.0000 mg | ORAL_TABLET | ORAL | 0 refills | Status: AC

## 2023-08-06 MED ORDER — LEVOFLOXACIN 500 MG PO TABS: 500.0000 mg | ORAL_TABLET | Freq: Every day | ORAL | 0 refills | Status: AC

## 2023-08-06 NOTE — Discharge Instructions (Addendum)
 The urinalysis showed nitrites, which could be a sign of urinary infection.  Levaquin 500 mg--take 1 tablet daily for 7 days; this is the antibiotic for possible urine infection.   Take fluconazole  150 mg--1 tablet every 3 days for 3 doses  Staff will notify you if there is anything positive on the swab.  Follow-up with your primary care

## 2023-08-06 NOTE — ED Triage Notes (Signed)
 I have been treated for UTI but still having unusual symptoms I want to make sure it's not a STI - Entered by patient

## 2023-08-06 NOTE — ED Provider Notes (Signed)
 [facility]    CSN: [id number] Arrival date & time: 08/06/23  1526      History   Chief Complaint Chief Complaint  Patient presents with   Urinary Frequency    HPI Kathy Burns is a 38 y.o. female.    Urinary Frequency  Here for frequency and dysuria and vaginal itching.  She was treated for a UTI in May, and then a follow-up culture was negative.  She then started having some more symptoms and did an e-visit and was treated for possible UTI with 5 days of Bactrim , without any testing.  She has had some positive urine cultures in the past, and the one in July 2024 showed E. coli, resistant to sulfa  and amoxicillin  and intermediate sensitivity to nitrofurantoin .  She has trouble taking Tylenol  with codeine.  Last menstrual cycle was May 16  After she took the antibiotics for the UTI, she has had more vaginal itching.  She is still having dysuria and urinary frequency.  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 02/14/2019   Last Assessment & Plan:    Patient is a 38 yo G4P3003 at [redacted]w[redacted]d with a pregnancy complicated by multiple fetal anomalies, potentially compromising the airway, admitted for extended monitoring and steroids in the setting of BPP 6/10 on day of admission at the MFM office       -  BMZ#1 on 12/15 at 2203, BMZ#2 on 12/16 at 1009   - FHT: category I throughout inpatient admission   - 12/16: BPP 8/8     Patient Active Problem List   Diagnosis Date Noted    Well woman exam with routine gynecological exam 05/31/2023   Atypical chest pain 03/11/2023   Palpitations 03/11/2023   Admission for palliative care 10/22/2022   RLS (restless legs syndrome) 11/19/2021   OSA (obstructive sleep apnea) 07/29/2021   COPD, moderate (HCC) 04/03/2021   Gastroesophageal reflux disease without esophagitis 02/04/2021   History of substance abuse (HCC) 02/04/2021   Morbid obesity (HCC) 02/04/2021   Chronic RUQ pain 10/25/2019   Fetal anomaly necessitating delivery 02/27/2019   Bipolar disorder, in partial remission, most recent episode mixed (HCC) 06/15/2016   Chronic hepatitis C without hepatic coma (HCC) 06/15/2016   Heavy smoker 06/15/2016   Herpes simplex antibody positive 06/15/2016   Cellulitis 02/15/2013   Polysubstance abuse (HCC) 02/15/2013    Past Surgical History:  Procedure Laterality Date   CESAREAN SECTION     x 4   CHOLECYSTECTOMY     GASTRIC BYPASS     REMOVAL OF EAR TUBE     TEMPOROMANDIBULAR JOINT ARTHROPLASTY     TONSILLECTOMY AND ADENOIDECTOMY     TUBAL LIGATION      OB History     Gravida  5   Para  4   Term  4   Preterm      AB  1  Living  4      SAB      IAB  1   Ectopic      Multiple      Live Births  4            Home Medications    Prior to Admission medications   Medication Sig Start Date End Date Taking? Authorizing Provider  fluconazole  (DIFLUCAN ) 150 MG tablet Take 1 tablet (150 mg total) by mouth every 3 (three) days for 3 doses. 08/06/23 08/13/23 Yes Ryla Cauthon K, MD  levofloxacin (LEVAQUIN) 500 MG tablet Take 1 tablet (500 mg total) by mouth daily for 7 days. 08/06/23 08/13/23 Yes Ann Keto, MD  albuterol  (VENTOLIN  HFA) 108 [phone] Base) MCG/ACT inhaler Inhale 2 puffs into the lungs every 6 (six) hours as needed for wheezing or shortness of breath. 12/08/22   Abraham Abo, MD  IBUPROFEN PO Take by mouth.    [provider]    Family History Family History  Problem  Relation Age of Onset   Ovarian cancer Mother    Heart disease Maternal Grandmother    Heart disease Maternal Grandfather    Breast cancer Paternal Grandmother    Esophageal cancer Paternal Grandfather    Hypertension Other    Diabetes Other    Cancer Other    Stroke Other    Breast cancer Paternal Aunt     Social History Social History   Tobacco Use   Smoking status: Every Day    Current packs/day: 0.00    Types: Cigarettes    Last attempt to quit: 2023    Years since quitting: 2.4   Smokeless tobacco: Never  Vaping Use   Vaping status: Every Day   Substances: Nicotine, Flavoring  Substance Use Topics   Alcohol use: Yes    Comment: a pint or more a day -beer and liquor   Drug use: Not Currently    Types: Marijuana, Cocaine, Heroin    Comment: 02/14/2013 last use, heroine last use 11/21/14     Allergies   Brassica oleracea, Tylenol  [acetaminophen ], and Codeine   Review of Systems Review of Systems  Genitourinary:  Positive for frequency.     Physical Exam Triage Vital Signs ED Triage Vitals  Encounter Vitals Group     BP 08/06/23 1533 106/66     Systolic BP Percentile --      Diastolic BP Percentile --      Pulse Rate 08/06/23 1533 83     Resp 08/06/23 1533 18     Temp 08/06/23 1533 98 F (36.7 C)     Temp Source 08/06/23 1533 Oral     SpO2 08/06/23 1533 96 %     Weight 08/06/23 1531 170 lb (77.1 kg)     Height 08/06/23 1531 5' 5.5" (1.664 m)     Head Circumference --      Peak Flow --      Pain Score 08/06/23 1530 0     Pain Loc --      Pain Education --      Exclude from Growth Chart --    No data found.  Updated Vital Signs BP 106/66 (BP Location: Left Arm)   Pulse 83   Temp 98 F (36.7 C) (Oral)   Resp 18   Ht 5' 5.5" (1.664 m)   Wt 77.1 kg   LMP 07/16/2023 (Approximate)   SpO2 96%   BMI 27.86 kg/m   Visual Acuity Right Eye Distance:  Left Eye Distance:   Bilateral Distance:    Right Eye Near:   Left Eye Near:    Bilateral  Near:     Physical Exam Vitals reviewed.  Constitutional:      General: She is not in acute distress.    Appearance: She is not ill-appearing, toxic-appearing or diaphoretic.  Cardiovascular:     Rate and Rhythm: Normal rate and regular rhythm.  Abdominal:     Palpations: Abdomen is soft.     Tenderness: There is no abdominal tenderness.  Skin:    Coloration: Skin is not jaundiced or pale.  Neurological:     General: No focal deficit present.     Mental Status: She is alert and oriented to person, place, and time.  Psychiatric:        Behavior: Behavior normal.      UC Treatments / Results  Labs (all labs ordered are listed, but only abnormal results are displayed) Labs Reviewed  POCT URINALYSIS DIP (MANUAL ENTRY) - Abnormal; Notable for the following components:      Result Value   Color, UA orange (*)    Glucose, UA =100 (*)    Bilirubin, UA small (*)    Spec Grav, UA >=1.030 (*)    Protein Ur, POC =30 (*)    Nitrite, UA Positive (*)    All other components within normal limits  URINE CULTURE  CERVICOVAGINAL ANCILLARY ONLY    EKG   Radiology No results found.  Procedures Procedures (including critical care time)  Medications Ordered in UC Medications - No data to display  Initial Impression / Assessment and Plan / UC Course  I have reviewed the triage vital signs and the nursing notes.  Pertinent labs & imaging results that were available during my care of the patient were reviewed by me and considered in my medical decision making (see chart for details).   The only abnormality is nitrites, and she has been using some Pyridium .  Urine culture is sent  Levaquin is sent to cover UTI and potential chlamydia.  Diflucan  is sent in for 3 doses to treat possible yeast infection.  Staff will notify her on anything significantly abnormal on the vaginal swab she was done.  She has declined blood work for HIV and RPR  We will notify her if there is a UTI and  it looks like she needs a different antibiotic. Final Clinical Impressions(s) / UC Diagnoses   Final diagnoses:  Dysuria  Acute vaginitis     Discharge Instructions      The urinalysis showed nitrites, which could be a sign of urinary infection.  Levaquin 500 mg--take 1 tablet daily for 7 days; this is the antibiotic for possible urine infection.   Take fluconazole  150 mg--1 tablet every 3 days for 3 doses  Staff will notify you if there is anything positive on the swab.  Follow-up with your primary care    ED Prescriptions     Medication Sig Dispense Auth. Provider   levofloxacin (LEVAQUIN) 500 MG tablet Take 1 tablet (500 mg total) by mouth daily for 7 days. 7 tablet Lebert Lovern, Paige Boatman, MD   fluconazole  (DIFLUCAN ) 150 MG tablet Take 1 tablet (150 mg total) by mouth every 3 (three) days for 3 doses. 3 tablet Gyneth Hubka, Paige Boatman, MD      PDMP not reviewed this encounter.   Ann Keto, MD 08/06/23 [phone]

## 2023-08-08 LAB — URINE CULTURE: Culture: NO GROWTH

## 2023-08-09 ENCOUNTER — Ambulatory Visit (HOSPITAL_COMMUNITY): Payer: Self-pay

## 2023-08-09 LAB — CERVICOVAGINAL ANCILLARY ONLY
Bacterial Vaginitis (gardnerella): POSITIVE — AB
Candida Glabrata: POSITIVE — AB
Candida Vaginitis: NEGATIVE
Chlamydia: NEGATIVE
Comment: NEGATIVE
Comment: NEGATIVE
Comment: NEGATIVE
Comment: NEGATIVE
Comment: NEGATIVE
Comment: NORMAL
Neisseria Gonorrhea: NEGATIVE
Trichomonas: NEGATIVE

## 2023-08-10 ENCOUNTER — Ambulatory Visit: Attending: Cardiovascular Disease | Admitting: Emergency Medicine

## 2023-08-10 DIAGNOSIS — Z01810 Encounter for preprocedural cardiovascular examination: Secondary | ICD-10-CM | POA: Diagnosis not present

## 2023-08-10 NOTE — Progress Notes (Signed)
 Virtual Visit via Telephone Note   Because of Kathy Burns co-morbid illnesses, she is at least at moderate risk for complications without adequate follow up.  This format is felt to be most appropriate for this patient at this time.  Due to technical limitations with video connection (technology), today's appointment will be conducted as an audio only telehealth visit, and Kathy Burns verbally agreed to proceed in this manner.   All issues noted in this document were discussed and addressed.  No physical exam could be performed with this format.  Evaluation Performed:  Preoperative cardiovascular risk assessment _____________   Date:  08/10/2023   Patient ID:  Kathy Burns, DOB [birth year], MRN [id number] Patient Location:  Home Provider location:   Office  Primary Care Provider:  Senaida Dama, NP Primary Cardiologist:  None  Chief Complaint / Patient Profile   38 y.o. y/o female with a h/o dyslipidemia, obstructive sleep apnea, palpitations, smoking/vaping, alcohol abuse who is pending Panniculectomy with plastic surgery specialist with [facility] on date TBD and presents today for telephonic preoperative cardiovascular risk assessment.  History of Present Illness    Kathy Burns is a 38 y.o. female who presents via audio/video conferencing for a telehealth visit today.  Pt was last seen in cardiology clinic on 03/11/2023 by Dr. Gordan Latina.  At that time Kathy Burns was doing well.  The patient is now pending procedure as outlined above. Since her last visit, she denies chest pain, shortness of breath, lower extremity edema, fatigue, palpitations, melena, hematuria, hemoptysis, diaphoresis, weakness, presyncope, syncope, orthopnea, and PND.  Today patient is doing well overall.  She is without any acute cardiovascular concerns or complaints at this time.  No chest pains or exertional symptoms.  No angina, no indication for further ischemic evaluation at this time.   Notes she stays relatively active especially with her child.  She works full-time at [employer] without any exertional symptoms.  Play sports with her son without limitation.  Does all the housework with ease.  Overall she is able to complete greater than 4 METS. 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 02/14/2019   Last Assessment & Plan:    Patient is a 38 yo G4P3003 at [redacted]w[redacted]d with a pregnancy complicated by multiple fetal anomalies, potentially compromising the airway, admitted for extended monitoring and steroids in the setting of BPP 6/10 on day of admission at the MFM office       -  BMZ#1 on 12/15 at 2203, BMZ#2 on 12/16 at 1009   - FHT: category I throughout inpatient admission   - 12/16: BPP 8/8    Past Surgical History:  Procedure Laterality Date   CESAREAN SECTION     x 4   CHOLECYSTECTOMY     GASTRIC BYPASS     REMOVAL OF EAR TUBE  TEMPOROMANDIBULAR JOINT ARTHROPLASTY     TONSILLECTOMY AND ADENOIDECTOMY     TUBAL LIGATION      Allergies  Allergies  Allergen Reactions   Brassica Oleracea Anaphylaxis, Itching, Swelling and Other (See Comments)    Only raw broccoli causes reaction:  Tongue swelling with raw broccoli   Tongue swelling with raw broccoli   Other reaction(s): Other- (not listed) - Allergy  Tongue swelling with raw broccoli   Tylenol  [Acetaminophen ] Palpitations   Codeine Itching    Home Medications    Prior to Admission medications    Medication Sig Start Date End Date Taking? Authorizing Provider  albuterol  (VENTOLIN  HFA) 108 (90 Base) MCG/ACT inhaler Inhale 2 puffs into the lungs every 6 (six) hours as needed for wheezing or shortness of breath. 12/08/22   Abraham Abo, MD  fluconazole  (DIFLUCAN ) 150 MG tablet Take 1 tablet (150 mg total) by mouth every 3 (three) days for 3 doses. 08/06/23 08/13/23  Banister, Pamela K, MD  IBUPROFEN PO Take by mouth.    [provider]  levofloxacin  (LEVAQUIN ) 500 MG tablet Take 1 tablet (500 mg total) by mouth daily for 7 days. 08/06/23 08/13/23  Ann Keto, MD    Physical Exam    Vital Signs:  Kathy Burns does not have vital signs available for review today.  Given telephonic nature of communication, physical exam is limited. AAOx3. NAD. Normal affect.  Speech and respirations are unlabored.  Accessory Clinical Findings    None  Assessment & Plan    1.  Preoperative Cardiovascular Risk Assessment: According to the Revised Cardiac Risk Index (RCRI), her Perioperative Risk of Major Cardiac Event is (%): 0.4. Her Functional Capacity in METs is: 7.01 according to the Duke Activity Status Index (DASI). Therefore, based on ACC/AHA guidelines, patient would be at acceptable risk for the planned procedure without further cardiovascular testing.   The patient was advised that if she develops new symptoms prior to surgery to contact our office to arrange for a follow-up visit, and she verbalized understanding.  She may hold aspirin  for 5-7 days prior to procedure. Please resume aspirin  as soon as possible postprocedure, at the discretion of the surgeon.    A copy of this note will be routed to requesting surgeon.  Time:   Today, I have spent 7 minutes with the patient with telehealth technology discussing medical history, symptoms, and management plan.     Ava Boatman, NP  08/10/2023, 2:28 PM

## 2023-08-11 MED ORDER — METRONIDAZOLE 500 MG PO TABS: 500.0000 mg | ORAL_TABLET | Freq: Two times a day (BID) | ORAL | 0 refills | Status: AC

## 2023-08-27 ENCOUNTER — Telehealth: Admitting: Physician Assistant

## 2023-08-27 DIAGNOSIS — N390 Urinary tract infection, site not specified: Secondary | ICD-10-CM

## 2023-08-27 NOTE — Progress Notes (Signed)
  Because of having recurrent UTIs and a recent negative culture that was shortly after an antibiotic, I feel your condition warrants further evaluation and I recommend that you be seen in a face-to-face visit to have another urine culture obtained to see bacteria type and any antibiotic resistance. It may be worthwhile to discuss recurrence of UTIs with your PCP as well to see about a potential referral to Urology for the recurrent UTIs.   NOTE: There will be NO CHARGE for this E-Visit   If you are having a true medical emergency, please call 911.     For an urgent face to face visit, [place] [place] has multiple urgent care centers for your convenience.  Click the link below for the full list of locations and hours, walk-in wait times, appointment scheduling options and driving directions:  Urgent Care - [place], [place], [place], [place], [place], [place]  [place]     Your MyChart E-visit questionnaire answers were reviewed by a board certified advanced clinical practitioner to complete your personal care plan based on your specific symptoms.    Thank you for using e-Visits.     I have spent 5 minutes in review of e-visit questionnaire, review and updating patient chart, medical decision making and response to patient.   Delon CHRISTELLA Dickinson, [name]

## 2023-09-02 ENCOUNTER — Ambulatory Visit
Admission: RE | Admit: 2023-09-02 | Discharge: 2023-09-02 | Disposition: A | Discharge status: Home / Self Care | Source: Ambulatory Visit | Attending: Emergency Medicine | Admitting: Emergency Medicine

## 2023-09-02 VITALS — BP 154/99 | HR 69 | Temp 98.3°F | Resp 16 | Wt 170.0 lb

## 2023-09-02 DIAGNOSIS — N898 Other specified noninflammatory disorders of vagina: Secondary | ICD-10-CM | POA: Diagnosis present

## 2023-09-02 DIAGNOSIS — R300 Dysuria: Secondary | ICD-10-CM | POA: Diagnosis not present

## 2023-09-02 LAB — POCT URINALYSIS DIP (MANUAL ENTRY)
Bilirubin, UA: NEGATIVE
Blood, UA: NEGATIVE
Glucose, UA: NEGATIVE mg/dL
Ketones, POC UA: NEGATIVE mg/dL
Leukocytes, UA: NEGATIVE
Nitrite, UA: NEGATIVE
Protein Ur, POC: NEGATIVE mg/dL
Spec Grav, UA: <=1.005 — AB (ref 1.010–1.025)
Urobilinogen, UA: 0.2 U/dL
pH, UA: 5.5 (ref 5.0–8.0)

## 2023-09-02 MED ORDER — METRONIDAZOLE 500 MG PO TABS: 500.0000 mg | ORAL_TABLET | Freq: Two times a day (BID) | ORAL | 0 refills | Status: DC

## 2023-09-02 NOTE — ED Provider Notes (Signed)
 [facility]    CSN: [id number] Arrival date & time: 09/02/23  1140      History   Chief Complaint Chief Complaint  Patient presents with   Urinary Frequency    Entered by patient    HPI Kathy Burns is a 38 y.o. female.   Patient presents to clinic over concern for continued dysuria and external urethral irritation.  She is also having some vaginal irritation and discharge, this has been ongoing issue for the past 5 months.  She does currently have a Flagyl  prescription from last month and took 2 doses of Flagyl  yesterday to help with her symptoms.  She does have a history of alcoholism and is unable to stop drinking long enough to finish her Flagyl .  She has been contacted by behavioral health, has not followed up with them yet.  Would like a referral to urology.  Feels like she gets reoccurring UTIs, but then they turn out not to be UTIs.  Has a history of bacterial vaginosis and Candida glabrata.  Unsure of fever, always has hot and cold chills.  Always has abdominal pain and back pain.  None worse than baseline.  The history is provided by the patient and medical records.  Urinary Frequency    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 02/14/2019   Last Assessment & Plan:    Patient is a 38 yo [id number] at [redacted]w[redacted]d with a pregnancy complicated by multiple fetal anomalies, potentially compromising the airway, admitted for extended monitoring and steroids in the setting of BPP  6/10 on day of admission at the MFM office       -  BMZ#1 on 12/15 at 2203, BMZ#2 on 12/16 at 1009   - FHT: category I throughout inpatient admission   - 12/16: BPP 8/8     Patient Active Problem List   Diagnosis Date Noted   Well woman exam with routine gynecological exam 05/31/2023   Atypical chest pain 03/11/2023   Palpitations 03/11/2023   Admission for palliative care 10/22/2022   RLS (restless legs syndrome) 11/19/2021   OSA (obstructive sleep apnea) 07/29/2021   COPD, moderate (HCC) 04/03/2021   Gastroesophageal reflux disease without esophagitis 02/04/2021   History of substance abuse (HCC) 02/04/2021   Morbid obesity (HCC) 02/04/2021   Chronic RUQ pain 10/25/2019   Fetal anomaly necessitating delivery 02/27/2019   Bipolar disorder, in partial remission, most recent episode mixed (HCC) 06/15/2016   Chronic hepatitis C without hepatic coma (HCC) 06/15/2016   Heavy smoker 06/15/2016   Herpes simplex antibody positive 06/15/2016   Cellulitis 02/15/2013   Polysubstance abuse (HCC) 02/15/2013    Past Surgical History:  Procedure Laterality Date   CESAREAN SECTION     x 4   CHOLECYSTECTOMY     GASTRIC BYPASS     REMOVAL OF EAR TUBE     TEMPOROMANDIBULAR JOINT ARTHROPLASTY  TONSILLECTOMY AND ADENOIDECTOMY     TUBAL LIGATION      OB History     Gravida  5   Para  4   Term  4   Preterm      AB  1   Living  4      SAB      IAB  1   Ectopic      Multiple      Live Births  4            Home Medications    Prior to Admission medications   Medication Sig Start Date End Date Taking? Authorizing Provider  metroNIDAZOLE  (FLAGYL ) 500 MG tablet Take 1 tablet (500 mg total) by mouth 2 (two) times daily. 09/02/23  Yes Kiarrah Rausch  N, FNP  albuterol  (VENTOLIN  HFA) 108 (90 Base) MCG/ACT inhaler Inhale 2 puffs into the lungs every 6 (six) hours as needed for wheezing or shortness of breath. 12/08/22   Tanda Bleacher, MD  IBUPROFEN PO Take by mouth.     [provider]    Family History Family History  Problem Relation Age of Onset   Ovarian cancer Mother    Heart disease Maternal Grandmother    Heart disease Maternal Grandfather    Breast cancer Paternal Grandmother    Esophageal cancer Paternal Grandfather    Hypertension Other    Diabetes Other    Cancer Other    Stroke Other    Breast cancer Paternal Aunt     Social History Social History   Tobacco Use   Smoking status: Every Day    Current packs/day: 0.00    Types: Cigarettes    Last attempt to quit: 2023    Years since quitting: 2.5    Passive exposure: Current   Smokeless tobacco: Never  Vaping Use   Vaping status: Every Day   Substances: Nicotine, Flavoring  Substance Use Topics   Alcohol use: Yes    Comment: a pint or more a day -beer and liquor   Drug use: Not Currently    Types: Marijuana, Cocaine, Heroin    Comment: 02/14/2013 last use, heroine last use 11/21/14     Allergies   Brassica oleracea, Tylenol  [acetaminophen ], and Codeine   Review of Systems Review of Systems  Per HPI  Physical Exam Triage Vital Signs ED Triage Vitals  Encounter Vitals Group     BP 09/02/23 1151 (!) 154/99     Girls Systolic BP Percentile --      Girls Diastolic BP Percentile --      Boys Systolic BP Percentile --      Boys Diastolic BP Percentile --      Pulse Rate 09/02/23 1151 69     Resp 09/02/23 1151 16     Temp 09/02/23 1151 98.3 F (36.8 C)     Temp Source 09/02/23 1151 Oral     SpO2 09/02/23 1151 96 %     Weight 09/02/23 1150 169 lb 15.6 oz (77.1 kg)     Height --      Head Circumference --      Peak Flow --      Pain Score 09/02/23 1149 6     Pain Loc --      Pain Education --      Exclude from Growth Chart --    No data found.  Updated Vital Signs BP (!) 154/99 (BP Location: Left Arm)   Pulse 69   Temp 98.3 F (36.8  C) (Oral)   Resp 16   Wt 169 lb 15.6 oz (77.1 kg)   SpO2 96%   Breastfeeding No   BMI 27.86 kg/m   Visual  Acuity Right Eye Distance:   Left Eye Distance:   Bilateral Distance:    Right Eye Near:   Left Eye Near:    Bilateral Near:     Physical Exam Vitals and nursing note reviewed.  Constitutional:      Appearance: Normal appearance.  HENT:     Head: Normocephalic and atraumatic.     Right Ear: External ear normal.     Left Ear: External ear normal.     Nose: Nose normal.     Mouth/Throat:     Mouth: Mucous membranes are moist.  Eyes:     Conjunctiva/sclera: Conjunctivae normal.  Cardiovascular:     Rate and Rhythm: Normal rate.  Pulmonary:     Effort: Pulmonary effort is normal. No respiratory distress.  Skin:    General: Skin is warm and dry.  Neurological:     General: No focal deficit present.     Mental Status: She is alert and oriented to person, place, and time.  Psychiatric:        Mood and Affect: Mood normal.        Behavior: Behavior normal.      UC Treatments / Results  Labs (all labs ordered are listed, but only abnormal results are displayed) Labs Reviewed  POCT URINALYSIS DIP (MANUAL ENTRY) - Abnormal; Notable for the following components:      Result Value   Spec Grav, UA <=1.005 (*)    All other components within normal limits  CERVICOVAGINAL ANCILLARY ONLY    EKG   Radiology No results found.  Procedures Procedures (including critical care time)  Medications Ordered in UC Medications - No data to display  Initial Impression / Assessment and Plan / UC Course  I have reviewed the triage vital signs and the nursing notes.  Pertinent labs & imaging results that were available during my care of the patient were reviewed by me and considered in my medical decision making (see chart for details).  Vitals in triage reviewed, patient is hemodynamically stable.  Some dysuria in clinic, UA unremarkable.  Continues to have vaginal symptoms, it appears she was not fully treated for her BV at her last appointment as she is still taking her Flagyl   almost 1 month later.  Will treat with Flagyl , offered vaginal suppositories, patient declined and requested oral Flagyl .  Cytology swab obtained and staff will contact if additional treatment is needed.  Encouraged OB/GYN follow-up for reoccurring vaginal infections.  Discussed with her that new evidence has come out that with BV, and may be beneficial for the males to receive antibiotics, as they may be contributing to their partner's reoccurring bacterial vaginosis infections.  Plan of care, follow-up care return precautions given, no questions at this time.    Final Clinical Impressions(s) / UC Diagnoses   Final diagnoses:  Dysuria  Vaginal irritation     Discharge Instructions      Take the Flagyl  twice daily for the next 7 days.  Take it as prescribed until finished.  Do not mix this medication with alcohol as it can make you violently sick.  Your urine did not show evidence of an infection, I believe your issue is vaginal in nature and you should follow-up with your OB/GYN for further evaluation if this continues.     ED  Prescriptions     Medication Sig Dispense Auth. Provider   metroNIDAZOLE  (FLAGYL ) 500 MG tablet Take 1 tablet (500 mg total) by mouth 2 (two) times daily. 14 tablet Dreama, Nikoletta Varma  N, FNP      PDMP not reviewed this encounter.   Dreama Red SAILOR, FNP 09/02/23 [phone]

## 2023-09-02 NOTE — Discharge Instructions (Signed)
 Take the Flagyl  twice daily for the next 7 days.  Take it as prescribed until finished.  Do not mix this medication with alcohol as it can make you violently sick.  Your urine did not show evidence of an infection, I believe your issue is vaginal in nature and you should follow-up with your OB/GYN for further evaluation if this continues.

## 2023-09-02 NOTE — ED Triage Notes (Signed)
 Pt presents c/o UTI sxs but doesn't think she has a UTI. Pt says she has been fighting this issue for 5 months. Pt took 2 flagyl  yesterday but also says it isn't working because she drinks excessively.

## 2023-09-06 LAB — CERVICOVAGINAL ANCILLARY ONLY
Bacterial Vaginitis (gardnerella): NEGATIVE
Candida Glabrata: POSITIVE — AB
Candida Vaginitis: NEGATIVE
Chlamydia: NEGATIVE
Comment: NEGATIVE
Comment: NEGATIVE
Comment: NEGATIVE
Comment: NEGATIVE
Comment: NEGATIVE
Comment: NORMAL
Neisseria Gonorrhea: NEGATIVE
Trichomonas: NEGATIVE

## 2023-09-07 ENCOUNTER — Ambulatory Visit (HOSPITAL_COMMUNITY): Payer: Self-pay

## 2023-09-07 MED ORDER — FLUCONAZOLE 150 MG PO TABS: 150.0000 mg | ORAL_TABLET | Freq: Once | ORAL | 0 refills | Status: AC

## 2023-09-10 ENCOUNTER — Ambulatory Visit: Payer: Self-pay | Admitting: Family

## 2023-09-10 ENCOUNTER — Encounter: Payer: Self-pay | Admitting: Family

## 2023-09-10 ENCOUNTER — Other Ambulatory Visit (HOSPITAL_COMMUNITY)
Admission: RE | Admit: 2023-09-10 | Discharge: 2023-09-10 | Disposition: A | Discharge status: Home / Self Care | Source: Ambulatory Visit | Attending: Family | Admitting: Family

## 2023-09-10 ENCOUNTER — Ambulatory Visit (INDEPENDENT_AMBULATORY_CARE_PROVIDER_SITE_OTHER): Admitting: Family

## 2023-09-10 VITALS — BP 128/76 | HR 92 | Temp 98.4°F | Resp 16 | Ht 65.5 in | Wt 163.8 lb

## 2023-09-10 DIAGNOSIS — R399 Unspecified symptoms and signs involving the genitourinary system: Secondary | ICD-10-CM | POA: Diagnosis not present

## 2023-09-10 DIAGNOSIS — Z114 Encounter for screening for human immunodeficiency virus [HIV]: Secondary | ICD-10-CM

## 2023-09-10 DIAGNOSIS — N390 Urinary tract infection, site not specified: Secondary | ICD-10-CM | POA: Diagnosis not present

## 2023-09-10 DIAGNOSIS — N3001 Acute cystitis with hematuria: Secondary | ICD-10-CM

## 2023-09-10 DIAGNOSIS — Z3202 Encounter for pregnancy test, result negative: Secondary | ICD-10-CM | POA: Diagnosis not present

## 2023-09-10 DIAGNOSIS — Z113 Encounter for screening for infections with a predominantly sexual mode of transmission: Secondary | ICD-10-CM | POA: Diagnosis present

## 2023-09-10 LAB — POCT URINALYSIS DIP (CLINITEK)
Glucose, UA: 250 mg/dL — AB
Nitrite, UA: POSITIVE — AB
POC PROTEIN,UA: >=300 — AB
Spec Grav, UA: <=1.005 — AB (ref 1.010–1.025)
Urobilinogen, UA: 4 U/dL — AB
pH, UA: 5 (ref 5.0–8.0)

## 2023-09-10 LAB — POCT URINE PREGNANCY: Preg Test, Ur: NEGATIVE

## 2023-09-10 MED ORDER — AMOXICILLIN-POT CLAVULANATE 875-125 MG PO TABS: 1.0000 | ORAL_TABLET | Freq: Two times a day (BID) | ORAL | 0 refills | Status: DC

## 2023-09-10 MED ORDER — NITROFURANTOIN MONOHYD MACRO 100 MG PO CAPS: 100.0000 mg | ORAL_CAPSULE | Freq: Two times a day (BID) | ORAL | 0 refills | Status: AC

## 2023-09-10 NOTE — Progress Notes (Signed)
 Patient wants a referral to a urologist patient is burning like she has a uti

## 2023-09-10 NOTE — Progress Notes (Signed)
 Patient ID: Kathy Burns, female    DOB: [birth year]  MRN: [id number]  CC: Referral  Subjective: Kathy Burns is a 38 y.o. female who presents for referral.   Her concerns today include:  States frequent UTI's and would like referral to Urology. States feels sensation of burning. Denies red flag symptoms.  Patient Active Problem List   Diagnosis Date Noted   Well woman exam with routine gynecological exam 05/31/2023   Atypical chest pain 03/11/2023   Palpitations 03/11/2023   Admission for palliative care 10/22/2022   RLS (restless legs syndrome) 11/19/2021   OSA (obstructive sleep apnea) 07/29/2021   COPD, moderate (HCC) 04/03/2021   Gastroesophageal reflux disease without esophagitis 02/04/2021   History of substance abuse (HCC) 02/04/2021   Morbid obesity (HCC) 02/04/2021   Chronic RUQ pain 10/25/2019   Fetal anomaly necessitating delivery 02/27/2019   Bipolar disorder, in partial remission, most recent episode mixed (HCC) 06/15/2016   Chronic hepatitis C without hepatic coma (HCC) 06/15/2016   Heavy smoker 06/15/2016   Herpes simplex antibody positive 06/15/2016   Cellulitis 02/15/2013   Polysubstance abuse (HCC) 02/15/2013     Current Outpatient Medications on File Prior to Visit  Medication Sig Dispense Refill   albuterol  (VENTOLIN  HFA) 108 (90 Base) MCG/ACT inhaler Inhale 2 puffs into the lungs every 6 (six) hours as needed for wheezing or shortness of breath. 8 g 0   IBUPROFEN PO Take by mouth.     metroNIDAZOLE  (FLAGYL ) 500 MG tablet Take 1 tablet (500 mg total) by mouth 2 (two) times daily. (Patient not taking: Reported on 09/10/2023) 14 tablet 0   No current facility-administered medications on file prior to visit.    Allergies  Allergen Reactions   Brassica Oleracea Anaphylaxis, Itching, Swelling and Other (See Comments)    Only raw broccoli causes reaction:  Tongue swelling with raw broccoli   Tongue swelling with raw broccoli   Other reaction(s):  Other- (not listed) - Allergy  Tongue swelling with raw broccoli   Tylenol  [Acetaminophen ] Palpitations   Codeine Itching    Social History   Socioeconomic History   Marital status: Legally Separated    Spouse name: Not on file   Number of children: 4   Years of education: Not on file   Highest education level: High school graduate  Occupational History   Occupation: Surveyor, mining  Tobacco Use   Smoking status: Every Day    Current packs/day: 0.00    Types: Cigarettes    Last attempt to quit: 2023    Years since quitting: 2.5    Passive exposure: Current   Smokeless tobacco: Never  Vaping Use   Vaping status: Every Day   Substances: Nicotine, Flavoring  Substance and Sexual Activity   Alcohol use: Yes    Comment: a pint or more a day -beer and liquor   Drug use: Not Currently    Types: Marijuana, Cocaine, Heroin    Comment: 02/14/2013 last use, heroine last use 11/21/14   Sexual activity: Yes    Birth control/protection: None  Other Topics Concern   Not on file  Social History Narrative   Not on file   Social Drivers of Health   Financial Resource Strain: Low Risk  (12/08/2022)   Overall Financial Resource Strain (CARDIA)    Difficulty of Paying Living Expenses: Not hard at all  Food Insecurity: No Food Insecurity (12/08/2022)   Hunger Vital Sign    Worried About Running Out of Food in the  Last Year: Never true    Ran Out of Food in the Last Year: Never true  Transportation Needs: No Transportation Needs (12/08/2022)   PRAPARE - Administrator, Civil Service (Medical): No    Lack of Transportation (Non-Medical): No  Physical Activity: Sufficiently Active (12/08/2022)   Exercise Vital Sign    Days of Exercise per Week: 5 days    Minutes of Exercise per Session: 30 min  Stress: No Stress Concern Present (12/08/2022)   [employer] of Occupational Health - Occupational Stress Questionnaire    Feeling of Stress : Only a little  Social Connections:  Unknown (12/15/2022)   Received from [facility]   Social Network    Social Network: Not on file  Recent Concern: Social Connections - Moderately Isolated (12/08/2022)   Social Connection and Isolation Panel    Frequency of Communication with Friends and Family: More than three times a week    Frequency of Social Gatherings with Friends and Family: More than three times a week    Attends Religious Services: More than 4 times per year    Active Member of [employer] or Organizations: No    Attends Banker Meetings: Never    Marital Status: Separated  Intimate Partner Violence: Unknown (12/15/2022)   Received from [facility]   HITS    Physically Hurt: Not on file    Insult or Talk Down To: Not on file    Threaten Physical Harm: Not on file    Scream or Curse: Not on file    Family History  Problem Relation Age of Onset   Ovarian cancer Mother    Heart disease Maternal Grandmother    Heart disease Maternal Grandfather    Breast cancer Paternal Grandmother    Esophageal cancer Paternal Grandfather    Hypertension Other    Diabetes Other    Cancer Other    Stroke Other    Breast cancer Paternal Aunt     Past Surgical History:  Procedure Laterality Date   CESAREAN SECTION     x 4   CHOLECYSTECTOMY     GASTRIC BYPASS     REMOVAL OF EAR TUBE     TEMPOROMANDIBULAR JOINT ARTHROPLASTY     TONSILLECTOMY AND ADENOIDECTOMY     TUBAL LIGATION      ROS: Review of Systems Negative except as stated above  PHYSICAL EXAM: BP 128/76   Pulse 92   Temp 98.4 F (36.9 C) (Oral)   Resp 16   Ht 5' 5.5 (1.664 m)   Wt 163 lb 12.8 oz (74.3 kg)   LMP 08/10/2023 (Exact Date)   SpO2 95%   BMI 26.84 kg/m   Physical Exam HENT:     Head: Normocephalic and atraumatic.     Nose: Nose normal.     Mouth/Throat:     Mouth: Mucous membranes are moist.     Pharynx: Oropharynx is clear.  Eyes:     Extraocular Movements: Extraocular movements intact.     Conjunctiva/sclera:  Conjunctivae normal.     Pupils: Pupils are equal, round, and reactive to light.  Cardiovascular:     Rate and Rhythm: Normal rate and regular rhythm.     Pulses: Normal pulses.     Heart sounds: Normal heart sounds.  Pulmonary:     Effort: Pulmonary effort is normal.     Breath sounds: Normal breath sounds.  Abdominal:     General: Bowel sounds are normal.  Palpations: Abdomen is soft.  Musculoskeletal:        General: Normal range of motion.     Cervical back: Normal range of motion and neck supple.  Neurological:     General: No focal deficit present.     Mental Status: She is alert and oriented to person, place, and time.  Psychiatric:        Mood and Affect: Mood normal.        Behavior: Behavior normal.     ASSESSMENT AND PLAN: 1. Lower urinary tract symptoms (LUTS) (Primary) 2. Frequent UTI - Routine screening.  - Referral to Urology for evaluation/management. - Ambulatory referral to Urology - POCT URINALYSIS DIP (CLINITEK); Future - Urine Culture - Basic Metabolic Panel - POCT urine pregnancy; Future  3. Routine screening for STI (sexually transmitted infection) - Routine screening.  - Cervicovaginal ancillary only - RPR  4. Encounter for screening for HIV - Routine screening.  - HIV antibody (with reflex)   Patient was given the opportunity to ask questions.  Patient verbalized understanding of the plan and was able to repeat key elements of the plan. Patient was given clear instructions to go to Emergency Department or return to medical center if symptoms don't improve, worsen, or new problems develop.The patient verbalized understanding.   Orders Placed This Encounter  Procedures   Urine Culture   Basic Metabolic Panel   HIV antibody (with reflex)   RPR   Ambulatory referral to Urology   POCT URINALYSIS DIP (CLINITEK)   POCT urine pregnancy    Follow-up with primary provider as scheduled.  Greig JINNY Drones, NP

## 2023-09-11 LAB — BASIC METABOLIC PANEL WITH GFR
BUN/Creatinine Ratio: 13 (ref 9–23)
BUN: 10 mg/dL (ref 6–24)
CO2: 20 mmol/L (ref 20–29)
Calcium: 9.3 mg/dL (ref 8.7–10.2)
Chloride: 103 mmol/L (ref 96–106)
Creatinine, Ser: 0.75 mg/dL (ref 0.57–1.00)
Glucose: 77 mg/dL (ref 70–99)
Potassium: 4.4 mmol/L (ref 3.5–5.2)
Sodium: 141 mmol/L (ref 134–144)
eGFR: 103 mL/min/1.73 (ref >59)

## 2023-09-11 LAB — RPR: RPR Ser Ql: NONREACTIVE

## 2023-09-11 LAB — HIV ANTIBODY (ROUTINE TESTING W REFLEX): HIV Screen 4th Generation wRfx: NONREACTIVE

## 2023-09-13 LAB — CERVICOVAGINAL ANCILLARY ONLY
Bacterial Vaginitis (gardnerella): NEGATIVE
Candida Glabrata: NEGATIVE
Candida Vaginitis: NEGATIVE
Chlamydia: NEGATIVE
Comment: NEGATIVE
Comment: NEGATIVE
Comment: NEGATIVE
Comment: NEGATIVE
Comment: NEGATIVE
Comment: NORMAL
Neisseria Gonorrhea: NEGATIVE
Trichomonas: NEGATIVE

## 2023-09-13 LAB — URINE CULTURE: Organism ID, Bacteria: NO GROWTH

## 2023-09-29 ENCOUNTER — Ambulatory Visit (INDEPENDENT_AMBULATORY_CARE_PROVIDER_SITE_OTHER): Admitting: Student

## 2023-09-29 DIAGNOSIS — M793 Panniculitis, unspecified: Secondary | ICD-10-CM

## 2023-09-29 MED ORDER — ONDANSETRON HCL 4 MG PO TABS: 4.0000 mg | ORAL_TABLET | Freq: Three times a day (TID) | ORAL | 0 refills | Status: DC | PRN

## 2023-09-29 NOTE — Progress Notes (Addendum)
 Patient ID: [name], female    DOB: 05/02/1985, 38 y.o.   MRN: [id number]  Chief Complaint  Patient presents with   Pre-op Exam      ICD-10-CM   1. 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  female  with a history of panniculitis.  She presents for preoperative evaluation for upcoming procedure, panniculectomy, scheduled for 10/11/2023 with Dr. Waddell.  The patient has not had problems with anesthesia.  Patient reports that she used to have palpitations before her weight loss, but has not had any issues with palpitations since her weight loss.  She states that she has been cleared for surgery by her cardiologist.  She denies taking any blood thinners.  Patient reports that she quit smoking 4 weeks ago.  Patient denies taking any birth control or hormone replacement.  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  She denies any personal history of blood clots.  She does report that her grandfather had a blood clot, she thinks it was a blood clot in his lung but was not entirely sure.  She denies any personal family history of clotting diseases.  She denies any recent surgeries, traumas or infections.  She denies any history of stroke or heart attack.  She denies any history of Crohn's disease or ulcerative colitis.  Patient states that she used to have COPD and OSA before her weight loss, but her COPD and OSA has completely resolved since her weight loss.  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  Patient states that she recently has been sick without any fevers, but feels as if her symptoms are resolving.  Summary of Previous Visit: Patient was initially seen by Dr. Waddell on 07/29/2023.  At this visit, patient reported that she underwent bariatric surgery in November 2023 and lost approximately 125 pounds.  Patient reported she had excess skin on the anterior portion of her abdomen and stated that  it pulled on her C-section incision which caused discomfort.  Patient also reported rashes underneath her pannus.  Patient had been seen by cardiology for palpitations and atypical chest pain.  She was taking aspirin  but was no longer taking it at that time.  Patient saw cardiology provider, Lum Louis, NP on 08/10/2023.  Per cardiology note: According to the Revised Cardiac Risk Index (RCRI), her Perioperative Risk of Major Cardiac Event is (%): 0.4. Her Functional Capacity in METs is: 7.01 according to the Duke Activity Status Index (DASI). Therefore, based on ACC/AHA guidelines, patient would be at acceptable risk for the planned procedure without further cardiovascular testing.  Job: Works as a Production designer, theatre/television/film at [employer], planning to take 2 weeks off.  She understands that she will have 6 weeks of restrictions.  PMH Significant for: COPD, OSA, chronic hepatitis C, polysubstance abuse, bipolar disorder, palpitations, panniculitis  Patient also states today she is experiencing an intertriginous rash to her bellybutton.  I recommended that she follow-up with her PCP in regards to this.  She expressed understanding.  Patient states that the only medication that she is taking at this time is the Augmentin  for chronic UTIs.  She denies taking any blood thinners, multivitamins or supplements.   Past Medical History: Allergies: Allergies  Allergen Reactions   Brassica Oleracea Anaphylaxis, Itching, Swelling and Other (See Comments)    Only raw broccoli causes reaction:  Tongue swelling with raw broccoli   Tongue  swelling with raw broccoli   Other reaction(s): Other- (not listed) - Allergy  Tongue swelling with raw broccoli   Tylenol  [Acetaminophen ] Palpitations   Codeine Itching    Current Medications:  Current Outpatient Medications:    ondansetron  (ZOFRAN ) 4 MG tablet, Take 1 tablet (4 mg total) by mouth every 8 (eight) hours as needed for up to 20 doses for nausea or vomiting., Disp:  20 tablet, Rfl: 0   albuterol  (VENTOLIN  HFA) 108 (90 Base) MCG/ACT inhaler, Inhale 2 puffs into the lungs every 6 (six) hours as needed for wheezing or shortness of breath., Disp: 8 g, Rfl: 0   amoxicillin -clavulanate (AUGMENTIN ) 875-125 MG tablet, Take 1 tablet by mouth 2 (two) times daily., Disp: 20 tablet, Rfl: 0   IBUPROFEN PO, Take by mouth., Disp: , Rfl:    metroNIDAZOLE  (FLAGYL ) 500 MG tablet, Take 1 tablet (500 mg total) by mouth 2 (two) times daily. 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 02/14/2019   Last Assessment & Plan:    Patient is a 38 yo G4P3003 at [redacted]w[redacted]d with a pregnancy complicated by multiple fetal anomalies, potentially compromising the airway, admitted for extended monitoring and steroids in the setting of BPP 6/10 on day of admission at the MFM office       -  BMZ#1 on 12/15 at 2203, BMZ#2 on 12/16 at 1009   - FHT: category I throughout inpatient admission   - 12/16: BPP 8/8     Past Surgical History: Past Surgical History:  Procedure Laterality Date   CESAREAN SECTION     x 4   CHOLECYSTECTOMY     GASTRIC BYPASS     REMOVAL OF EAR TUBE     TEMPOROMANDIBULAR JOINT ARTHROPLASTY     TONSILLECTOMY AND ADENOIDECTOMY     TUBAL LIGATION      Social History: Social History   Socioeconomic History   Marital status: Legally Separated    Spouse name: Not on file   Number of  children: 4   Years of education: Not on file   Highest education level: High school graduate  Occupational History   Occupation: Surveyor, mining  Tobacco Use   Smoking status: Every Day    Current packs/day: 0.00    Types: Cigarettes    Last attempt to quit: 2023    Years since quitting: 2.5    Passive exposure: Current   Smokeless tobacco: Never  Vaping Use   Vaping status: Every Day   Substances: Nicotine , Flavoring  Substance and Sexual Activity   Alcohol use: Yes    Comment: a pint or more a day -beer and liquor   Drug use: Not Currently    Types: Marijuana, Cocaine, Heroin    Comment: 02/14/2013 last use, heroine last use 11/21/14   Sexual activity: Yes    Birth  control/protection: None  Other Topics Concern   Not on file  Social History Narrative   Not on file   Social Drivers of Health   Financial Resource Strain: Low Risk  (12/08/2022)   Overall Financial Resource Strain (CARDIA)    Difficulty of Paying Living Expenses: Not hard at all  Food Insecurity: No Food Insecurity (12/08/2022)   Hunger Vital Sign    Worried About Running Out of Food in the Last Year: Never true    Ran Out of Food in the Last Year: Never true  Transportation Needs: No Transportation Needs (12/08/2022)   PRAPARE - Administrator, Civil Service (Medical): No    Lack of Transportation (Non-Medical): No  Physical Activity: Sufficiently Active (12/08/2022)   Exercise Vital Sign    Days of Exercise per Week: 5 days    Minutes of Exercise per Session: 30 min  Stress: No Stress Concern Present (12/08/2022)   [employer] of Occupational Health - Occupational Stress Questionnaire    Feeling of Stress : Only a little  Social Connections: Unknown (12/15/2022)   Received from [facility]   Social Network    Social Network: Not on file  Recent Concern: Social Connections - Moderately Isolated (12/08/2022)   Social Connection and Isolation Panel    Frequency of Communication with  Friends and Family: More than three times a week    Frequency of Social Gatherings with Friends and Family: More than three times a week    Attends Religious Services: More than 4 times per year    Active Member of [employer] or Organizations: No    Attends Banker Meetings: Never    Marital Status: Separated  Intimate Partner Violence: Unknown (12/15/2022)   Received from [facility]   HITS    Physically Hurt: Not on file    Insult or Talk Down To: Not on file    Threaten Physical Harm: Not on file    Scream or Curse: Not on file    Family History: Family History  Problem Relation Age of Onset   Ovarian cancer Mother    Heart disease Maternal Grandmother    Heart disease Maternal Grandfather    Breast cancer Paternal Grandmother    Esophageal cancer Paternal Grandfather    Hypertension Other    Diabetes Other    Cancer Other    Stroke Other    Breast cancer Paternal Aunt     Review of Systems: Reports recent illness, states that symptoms are improving  Physical Exam: Vital Signs LMP 08/10/2023 (Exact Date)   Physical Exam  Constitutional:      General: Not in acute distress.    Appearance: Normal appearance. Not ill-appearing.  HENT:     Head: Normocephalic and atraumatic.  Neck:     Musculoskeletal: Normal range of motion.  Cardiovascular:     Rate and Rhythm: Normal rate Pulmonary:     Effort: Pulmonary effort is normal. No respiratory distress.  Musculoskeletal: Normal range of motion.  Skin:    General: Skin is warm and dry.     Findings: No erythema or rash.  Neurological:     Mental Status: Alert and oriented to person, place, and time. Mental status is at baseline.  Psychiatric:        Mood and Affect: Mood normal.        Behavior: Behavior normal.    Assessment/Plan: The patient is scheduled for panniculectomy with Dr. Waddell.  Risks, benefits, and alternatives of procedure  discussed, questions answered and consent obtained.    Smoking  Status: Quit smoking 4 weeks ago; Counseling Given?  Yes, I discussed with the patient that nicotine  use can delay wound healing.  Will order nicotine  test for the patient.  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 Recommendation for mechanical and possible pharmacological prophylaxis. Encourage early ambulation.  Will discuss the possibility of postoperative Lovenox  with Dr. Waddell.  Pictures obtained: @consult   Post-op Rx sent to pharmacy: Will send Zofran , will discuss postoperative pain medications with Dr. Waddell.  I discussed with the patient that given her history of substance abuse, at most we would only give a few days worth of pain medications.  She expressed understanding.  I discussed with her that she should take Tylenol  and ibuprofen for her pain before she would take any pain medications.  She expressed understanding.  Patient was provided with the General Surgical Risk consent document and Pain Medication Agreement prior to their appointment.  They had adequate time to read through the risk consent documents and Pain Medication Agreement. We also discussed them in person together during this preop appointment. All of their questions were answered to their satisfaction.  Recommended calling if they have any further questions.  Risk consent form and Pain Medication Agreement to be scanned into patient's chart.  The consent was obtained with risks and complications reviewed which included bleeding, pain, scar, infection and the risk of anesthesia.  The patients questions were answered to the patients expressed satisfaction.   ADDEND: I spoke with Dr. Waddell about patient's history of substance abuse and postoperative pain medications.  He is okay with patient having a few days worth of pain medications.   Electronically signed by: Estefana FORBES Peck, [name] 09/30/2023 10:53 AM

## 2023-09-29 NOTE — H&P (View-Only) (Signed)
 Patient ID: Kathy Burns, female    DOB: 05/02/1985, 38 y.o.   MRN: [id number]  Chief Complaint  Patient presents with   Pre-op Exam      ICD-10-CM   1. 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  female  with a history of panniculitis.  She presents for preoperative evaluation for upcoming procedure, panniculectomy, scheduled for 10/11/2023 with Dr. Waddell.  The patient has not had problems with anesthesia.  Patient reports that she used to have palpitations before her weight loss, but has not had any issues with palpitations since her weight loss.  She states that she has been cleared for surgery by her cardiologist.  She denies taking any blood thinners.  Patient reports that she quit smoking 4 weeks ago.  Patient denies taking any birth control or hormone replacement.  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  She denies any personal history of blood clots.  She does report that her grandfather had a blood clot, she thinks it was a blood clot in his lung but was not entirely sure.  She denies any personal family history of clotting diseases.  She denies any recent surgeries, traumas or infections.  She denies any history of stroke or heart attack.  She denies any history of Crohn's disease or ulcerative colitis.  Patient states that she used to have COPD and OSA before her weight loss, but her COPD and OSA has completely resolved since her weight loss.  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  Patient states that she recently has been sick without any fevers, but feels as if her symptoms are resolving.  Summary of Previous Visit: Patient was initially seen by Dr. Waddell on 07/29/2023.  At this visit, patient reported that she underwent bariatric surgery in November 2023 and lost approximately 125 pounds.  Patient reported she had excess skin on the anterior portion of her abdomen and stated that  it pulled on her C-section incision which caused discomfort.  Patient also reported rashes underneath her pannus.  Patient had been seen by cardiology for palpitations and atypical chest pain.  She was taking aspirin  but was no longer taking it at that time.  Patient saw cardiology provider, Kathy Louis, NP on 08/10/2023.  Per cardiology note: According to the Revised Cardiac Risk Index (RCRI), her Perioperative Risk of Major Cardiac Event is (%): 0.4. Her Functional Capacity in METs is: 7.01 according to the Duke Activity Status Index (DASI). Therefore, based on ACC/AHA guidelines, patient would be at acceptable risk for the planned procedure without further cardiovascular testing.  Job: Works as a Production designer, theatre/television/film at [employer], planning to take 2 weeks off.  She understands that she will have 6 weeks of restrictions.  PMH Significant for: COPD, OSA, chronic hepatitis C, polysubstance abuse, bipolar disorder, palpitations, panniculitis  Patient also states today she is experiencing an intertriginous rash to her bellybutton.  I recommended that she follow-up with her PCP in regards to this.  She expressed understanding.  Patient states that the only medication that she is taking at this time is the Augmentin  for chronic UTIs.  She denies taking any blood thinners, multivitamins or supplements.   Past Medical History: Allergies: Allergies  Allergen Reactions   Brassica Oleracea Anaphylaxis, Itching, Swelling and Other (See Comments)    Only raw broccoli causes reaction:  Tongue swelling with raw broccoli   Tongue  swelling with raw broccoli   Other reaction(s): Other- (not listed) - Allergy  Tongue swelling with raw broccoli   Tylenol  [Acetaminophen ] Palpitations   Codeine Itching    Current Medications:  Current Outpatient Medications:    ondansetron  (ZOFRAN ) 4 MG tablet, Take 1 tablet (4 mg total) by mouth every 8 (eight) hours as needed for up to 20 doses for nausea or vomiting., Disp:  20 tablet, Rfl: 0   albuterol  (VENTOLIN  HFA) 108 (90 Base) MCG/ACT inhaler, Inhale 2 puffs into the lungs every 6 (six) hours as needed for wheezing or shortness of breath., Disp: 8 g, Rfl: 0   amoxicillin -clavulanate (AUGMENTIN ) 875-125 MG tablet, Take 1 tablet by mouth 2 (two) times daily., Disp: 20 tablet, Rfl: 0   IBUPROFEN PO, Take by mouth., Disp: , Rfl:    metroNIDAZOLE  (FLAGYL ) 500 MG tablet, Take 1 tablet (500 mg total) by mouth 2 (two) times daily. 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 02/14/2019   Last Assessment & Plan:    Patient is a 38 yo G4P3003 at [redacted]w[redacted]d with a pregnancy complicated by multiple fetal anomalies, potentially compromising the airway, admitted for extended monitoring and steroids in the setting of BPP 6/10 on day of admission at the MFM office       -  BMZ#1 on 12/15 at 2203, BMZ#2 on 12/16 at 1009   - FHT: category I throughout inpatient admission   - 12/16: BPP 8/8     Past Surgical History: Past Surgical History:  Procedure Laterality Date   CESAREAN SECTION     x 4   CHOLECYSTECTOMY     GASTRIC BYPASS     REMOVAL OF EAR TUBE     TEMPOROMANDIBULAR JOINT ARTHROPLASTY     TONSILLECTOMY AND ADENOIDECTOMY     TUBAL LIGATION      Social History: Social History   Socioeconomic History   Marital status: Legally Separated    Spouse name: Not on file   Number of  children: 4   Years of education: Not on file   Highest education level: High school graduate  Occupational History   Occupation: Surveyor, mining  Tobacco Use   Smoking status: Every Day    Current packs/day: 0.00    Types: Cigarettes    Last attempt to quit: 2023    Years since quitting: 2.5    Passive exposure: Current   Smokeless tobacco: Never  Vaping Use   Vaping status: Every Day   Substances: Nicotine , Flavoring  Substance and Sexual Activity   Alcohol use: Yes    Comment: a pint or more a day -beer and liquor   Drug use: Not Currently    Types: Marijuana, Cocaine, Heroin    Comment: 02/14/2013 last use, heroine last use 11/21/14   Sexual activity: Yes    Birth  control/protection: None  Other Topics Concern   Not on file  Social History Narrative   Not on file   Social Drivers of Health   Financial Resource Strain: Low Risk  (12/08/2022)   Overall Financial Resource Strain (CARDIA)    Difficulty of Paying Living Expenses: Not hard at all  Food Insecurity: No Food Insecurity (12/08/2022)   Hunger Vital Sign    Worried About Running Out of Food in the Last Year: Never true    Ran Out of Food in the Last Year: Never true  Transportation Needs: No Transportation Needs (12/08/2022)   PRAPARE - Administrator, Civil Service (Medical): No    Lack of Transportation (Non-Medical): No  Physical Activity: Sufficiently Active (12/08/2022)   Exercise Vital Sign    Days of Exercise per Week: 5 days    Minutes of Exercise per Session: 30 min  Stress: No Stress Concern Present (12/08/2022)   [employer] of Occupational Health - Occupational Stress Questionnaire    Feeling of Stress : Only a little  Social Connections: Unknown (12/15/2022)   Received from [facility]   Social Network    Social Network: Not on file  Recent Concern: Social Connections - Moderately Isolated (12/08/2022)   Social Connection and Isolation Panel    Frequency of Communication with  Friends and Family: More than three times a week    Frequency of Social Gatherings with Friends and Family: More than three times a week    Attends Religious Services: More than 4 times per year    Active Member of [employer] or Organizations: No    Attends Banker Meetings: Never    Marital Status: Separated  Intimate Partner Violence: Unknown (12/15/2022)   Received from [facility]   HITS    Physically Hurt: Not on file    Insult or Talk Down To: Not on file    Threaten Physical Harm: Not on file    Scream or Curse: Not on file    Family History: Family History  Problem Relation Age of Onset   Ovarian cancer Mother    Heart disease Maternal Grandmother    Heart disease Maternal Grandfather    Breast cancer Paternal Grandmother    Esophageal cancer Paternal Grandfather    Hypertension Other    Diabetes Other    Cancer Other    Stroke Other    Breast cancer Paternal Aunt     Review of Systems: Reports recent illness, states that symptoms are improving  Physical Exam: Vital Signs LMP 08/10/2023 (Exact Date)   Physical Exam  Constitutional:      General: Not in acute distress.    Appearance: Normal appearance. Not ill-appearing.  HENT:     Head: Normocephalic and atraumatic.  Neck:     Musculoskeletal: Normal range of motion.  Cardiovascular:     Rate and Rhythm: Normal rate Pulmonary:     Effort: Pulmonary effort is normal. No respiratory distress.  Musculoskeletal: Normal range of motion.  Skin:    General: Skin is warm and dry.     Findings: No erythema or rash.  Neurological:     Mental Status: Alert and oriented to person, place, and time. Mental status is at baseline.  Psychiatric:        Mood and Affect: Mood normal.        Behavior: Behavior normal.    Assessment/Plan: The patient is scheduled for panniculectomy with Dr. Waddell.  Risks, benefits, and alternatives of procedure  discussed, questions answered and consent obtained.    Smoking  Status: Quit smoking 4 weeks ago; Counseling Given?  Yes, I discussed with the patient that nicotine  use can delay wound healing.  Will order nicotine  test for the patient.  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 Recommendation for mechanical and possible pharmacological prophylaxis. Encourage early ambulation.  Will discuss the possibility of postoperative Lovenox  with Dr. Waddell.  Pictures obtained: @consult   Post-op Rx sent to pharmacy: Will send Zofran , will discuss postoperative pain medications with Dr. Waddell.  I discussed with the patient that given her history of substance abuse, at most we would only give a few days worth of pain medications.  She expressed understanding.  I discussed with her that she should take Tylenol  and ibuprofen for her pain before she would take any pain medications.  She expressed understanding.  Patient was provided with the General Surgical Risk consent document and Pain Medication Agreement prior to their appointment.  They had adequate time to read through the risk consent documents and Pain Medication Agreement. We also discussed them in person together during this preop appointment. All of their questions were answered to their satisfaction.  Recommended calling if they have any further questions.  Risk consent form and Pain Medication Agreement to be scanned into patient's chart.  The consent was obtained with risks and complications reviewed which included bleeding, pain, scar, infection and the risk of anesthesia.  The patients questions were answered to the patients expressed satisfaction.   ADDEND: I spoke with Dr. Waddell about patient's history of substance abuse and postoperative pain medications.  He is okay with patient having a few days worth of pain medications.   Electronically signed by: Kathy FORBES Peck, [name] 09/30/2023 10:53 AM

## 2023-09-30 MED ORDER — OXYCODONE HCL 5 MG PO TABS: 5.0000 mg | ORAL_TABLET | Freq: Three times a day (TID) | ORAL | 0 refills | Status: DC | PRN

## 2023-09-30 NOTE — Addendum Note (Signed)
 Addended by: ANDRIS STAGGER on: 09/30/2023 10:56 AM   Modules accepted: Orders

## 2023-10-04 ENCOUNTER — Telehealth: Payer: Self-pay | Admitting: *Deleted

## 2023-10-04 ENCOUNTER — Encounter (HOSPITAL_BASED_OUTPATIENT_CLINIC_OR_DEPARTMENT_OTHER): Payer: Self-pay | Admitting: Plastic Surgery

## 2023-10-04 NOTE — Telephone Encounter (Signed)
 Copied from CRM [id number]. Topic: Referral - Question >> Oct 04, 2023  8:58 AM Emylou G wrote: Reason for CRM: Patient called.. Pls call her on update on referral for urologist

## 2023-10-04 NOTE — Telephone Encounter (Signed)
 Patient given phone number to [facility].  Advise to call office if she has additional questions.

## 2023-10-06 LAB — NICOTINE/COTININE METABOLITES
Cotinine: <1 ng/mL
Nicotine: <1 ng/mL

## 2023-10-07 ENCOUNTER — Other Ambulatory Visit: Payer: Self-pay | Admitting: Student

## 2023-10-07 ENCOUNTER — Telehealth: Payer: Self-pay | Admitting: Plastic Surgery

## 2023-10-07 MED ORDER — ENOXAPARIN SODIUM 40 MG/0.4ML IJ SOSY: 40.0000 mg | PREFILLED_SYRINGE | INTRAMUSCULAR | 0 refills | Status: DC

## 2023-10-07 NOTE — Progress Notes (Signed)
 I called the patient and discussed with her that the results of her nicotine  and cotinine test were negative.  She expressed understanding  I also discussed with her that given her elevated Caprini score, Dr. Waddell would like her to be on postoperative Lovenox .  She states that she has taken this in the past and is familiar with it.  I discussed with her that her first dose will be in the recovery room after surgery, and then she will have Lovenox  for a total of 1 week after surgery.  She expressed understanding.  She understands to not start the Lovenox  prior to surgery.

## 2023-10-07 NOTE — Telephone Encounter (Signed)
 Patient says that she took her smoke test on Monday and just received result on mychart, she can't read them and would like to know if she will still have surgery, it states that her level are normal, please reach out

## 2023-10-07 NOTE — H&P (View-Only) (Signed)
 I called the patient and discussed with her that the results of her nicotine  and cotinine test were negative.  She expressed understanding  I also discussed with her that given her elevated Caprini score, Dr. Waddell would like her to be on postoperative Lovenox .  She states that she has taken this in the past and is familiar with it.  I discussed with her that her first dose will be in the recovery room after surgery, and then she will have Lovenox  for a total of 1 week after surgery.  She expressed understanding.  She understands to not start the Lovenox  prior to surgery.

## 2023-10-08 ENCOUNTER — Other Ambulatory Visit: Payer: Self-pay

## 2023-10-08 ENCOUNTER — Ambulatory Visit
Admission: RE | Admit: 2023-10-08 | Discharge: 2023-10-08 | Disposition: A | Discharge status: Home / Self Care | Source: Ambulatory Visit | Attending: Physician Assistant | Admitting: Physician Assistant

## 2023-10-08 VITALS — BP 128/82 | HR 60 | Temp 98.6°F | Resp 16

## 2023-10-08 DIAGNOSIS — R102 Pelvic and perineal pain: Secondary | ICD-10-CM | POA: Insufficient documentation

## 2023-10-08 DIAGNOSIS — R8281 Pyuria: Secondary | ICD-10-CM | POA: Insufficient documentation

## 2023-10-08 DIAGNOSIS — R300 Dysuria: Secondary | ICD-10-CM | POA: Diagnosis present

## 2023-10-08 LAB — POCT URINE DIPSTICK
Glucose, UA: 100 mg/dL — AB
Nitrite, UA: POSITIVE — AB
POC PROTEIN,UA: >=300 — AB
Spec Grav, UA: 1.025 (ref 1.010–1.025)
Urobilinogen, UA: 4 U/dL — AB
pH, UA: 5 (ref 5.0–8.0)

## 2023-10-08 MED ORDER — NITROFURANTOIN MONOHYD MACRO 100 MG PO CAPS
100.0000 mg | ORAL_CAPSULE | Freq: Two times a day (BID) | ORAL | 0 refills | Status: AC
Start: 2023-10-08 — End: 2023-10-13

## 2023-10-08 MED ORDER — PHENAZOPYRIDINE HCL 100 MG PO TABS: 100.0000 mg | ORAL_TABLET | Freq: Three times a day (TID) | ORAL | 0 refills | Status: DC | PRN

## 2023-10-08 MED ORDER — FLUCONAZOLE 150 MG PO TABS: 150.0000 mg | ORAL_TABLET | ORAL | 0 refills | Status: DC | PRN

## 2023-10-08 NOTE — Discharge Instructions (Signed)
 Based on your symptoms and results of the urinalysis I believe you have a UTI I recommend the following:  I have sent in a script for Macrobid  100 mg to be taken by mouth twice per day for 5 days Please finish the entire course of the antibiotic even if you are feeling better before it is completed unless you develop an allergic reaction or are told by a medical provider to stop taking it. I am also sending in a prescription for medication called Pyridium  for you to take by mouth up to 3 times per day as needed for urinary discomfort.  This medication can turn your urine orange so please do not be alarmed if this occurs. I have also sent in a medication called Diflucan  to assist with vulvovaginal yeast infection.  You can take it by mouth once every 72 hours as needed for symptoms. We have sent a sample of your urine off for a urine culture.  This will help us  determine what bacteria is causing your symptoms as well as the most appropriate antibiotic to treat it.  If we need to make any adjustments to your medication regimen and new medication will be sent to the pharmacy on file and you will be updated via phone call in MyChart. We have also collected a cervicovaginal swab to assess for bacterial vaginosis, yeast, trichomonas, gonorrhea, chlamydia.  We will keep you updated on those results as well as any medications that are indicated by those results once it is available. Stay well hydrated (at least 75 oz of water per day) and avoid holding your urine for prolonged periods of time. If you have any of the following please return to urgent care or go to the emergency room: Persistent symptoms, fever, trouble urinating or inability to urinate, confusion, flank pain.

## 2023-10-08 NOTE — ED Provider Notes (Signed)
 [facility]    CSN: [id number] Arrival date & time: 10/08/23  0954      History   Chief Complaint Chief Complaint  Patient presents with   Urinary Frequency    Prone to UTIs - Entered by patient    HPI Kathy Burns is a 38 y.o. female.   HPI  Pt states she has concerns for UTI She states she started having symptoms about 3 days ago  She reports dysuria and suprapubic pain She denies vaginal discharge changes, bleeding Interventions: she reports that she sometimes drinks alcohol or tea which provides some relief but this is shortlived   She reports that she has discomfort after urinating and the vulvovaginal area seems inflamed  She just completed a course of Augmentin  earlier in July   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 02/14/2019   Last Assessment & Plan:    Patient is a 38 yo [id number] at [redacted]w[redacted]d with a pregnancy complicated by multiple fetal anomalies, potentially compromising the airway, admitted for extended monitoring and steroids in the setting of BPP 6/10 on day of admission at the MFM office       -  BMZ#1 on 12/15 at 2203, BMZ#2 on 12/16 at 1009   - FHT: category I throughout inpatient admission   - 12/16: BPP 8/8     Patient Active Problem List   Diagnosis Date Noted   Well woman exam with routine gynecological exam 05/31/2023   Atypical chest pain 03/11/2023   Palpitations 03/11/2023   Admission for palliative care 10/22/2022   RLS (restless legs  syndrome) 11/19/2021   OSA (obstructive sleep apnea) 07/29/2021   COPD, moderate (HCC) 04/03/2021   Gastroesophageal reflux disease without esophagitis 02/04/2021   History of substance abuse (HCC) 02/04/2021   Morbid obesity (HCC) 02/04/2021   Chronic RUQ pain 10/25/2019   Fetal anomaly necessitating delivery 02/27/2019   Bipolar disorder, in partial remission, most recent episode mixed (HCC) 06/15/2016   Chronic hepatitis C without hepatic coma (HCC) 06/15/2016   Heavy smoker 06/15/2016   Herpes simplex antibody positive 06/15/2016   Cellulitis 02/15/2013   Polysubstance abuse (HCC) 02/15/2013    Past Surgical History:  Procedure Laterality Date   CESAREAN SECTION     x 4   CHOLECYSTECTOMY     GASTRIC BYPASS     REMOVAL OF EAR TUBE     TEMPOROMANDIBULAR JOINT ARTHROPLASTY     TONSILLECTOMY AND ADENOIDECTOMY     TUBAL LIGATION      OB History     Gravida  5   Para  4   Term  4   Preterm      AB  1   Living  4      SAB      IAB  1   Ectopic      Multiple      Live Births  4  Home Medications    Prior to Admission medications   Medication Sig Start Date End Date Taking? Authorizing Provider  enoxaparin  (LOVENOX ) 40 MG/0.4ML injection Inject 0.4 mLs (40 mg total) into the skin daily for 6 days. 10/07/23 10/13/23  [name], [name]  fluconazole  (DIFLUCAN ) 150 MG tablet Take 1 tablet (150 mg total) by mouth every three (3) days as needed. May repeat in 3 days if symptoms not resolved 10/08/23  Yes Taria Castrillo E, [name]  nitrofurantoin , macrocrystal-monohydrate, (MACROBID ) 100 MG capsule Take 1 capsule (100 mg total) by mouth 2 (two) times daily for 5 days. 10/08/23 10/13/23 Yes Izadora Roehr E, [name]  phenazopyridine  (PYRIDIUM ) 100 MG tablet Take 1 tablet (100 mg total) by mouth 3 (three) times daily as needed for pain. 10/08/23  Yes Tom Ragsdale E, [name]  albuterol  (VENTOLIN  HFA) 108 (90 Base) MCG/ACT inhaler Inhale 2 puffs into the lungs every 6 (six)  hours as needed for wheezing or shortness of breath. 12/08/22   Tanda Bleacher, MD  IBUPROFEN PO Take by mouth.    [provider]  ondansetron  (ZOFRAN ) 4 MG tablet Take 1 tablet (4 mg total) by mouth every 8 (eight) hours as needed for up to 20 doses for nausea or vomiting. 09/29/23   [name], [name]  oxyCODONE  (ROXICODONE ) 5 MG immediate release tablet Take 1 tablet (5 mg total) by mouth every 8 (eight) hours as needed for up to 10 doses for severe pain (pain score 7-10). 09/30/23   [name], [name]    Family History Family History  Problem Relation Age of Onset   Ovarian cancer Mother    Heart disease Maternal Grandmother    Heart disease Maternal Grandfather    Breast cancer Paternal Grandmother    Esophageal cancer Paternal Grandfather    Hypertension Other    Diabetes Other    Cancer Other    Stroke Other    Breast cancer Paternal Aunt     Social History Social History   Tobacco Use   Smoking status: Every Day    Current packs/day: 0.00    Types: Cigarettes    Last attempt to quit: 2023    Years since quitting: 2.6    Passive exposure: Current   Smokeless tobacco: Never  Vaping Use   Vaping status: Every Day   Substances: Nicotine , Flavoring  Substance Use Topics   Alcohol use: Yes    Comment: a pint or more a day -beer and liquor   Drug use: Not Currently    Types: Marijuana, Cocaine, Heroin    Comment: 02/14/2013 last use, heroine last use 11/21/14     Allergies   Brassica oleracea, Tylenol  [acetaminophen ], and Codeine   Review of Systems Review of Systems  Constitutional:  Positive for chills (reports she has chills constantly). Negative for fever.  Gastrointestinal:  Positive for abdominal pain (reports having chronic pain in suprapubic area from multiple C-sections and gastric bypass).  Genitourinary:  Positive for difficulty urinating (intermittently), dysuria, flank pain (she is unsure if this is due to weight loss or current  dysuria), frequency and urgency. Negative for vaginal bleeding, vaginal discharge and vaginal pain.     Physical Exam Triage Vital Signs ED Triage Vitals [10/08/23 1016]  Encounter Vitals Group     BP 128/82     Girls Systolic BP Percentile      Girls Diastolic BP Percentile      Boys Systolic BP Percentile      Boys Diastolic BP  Percentile      Pulse Rate 60     Resp 16     Temp 98.6 F (37 C)     Temp Source Oral     SpO2 97 %     Weight      Height      Head Circumference      Peak Flow      Pain Score      Pain Loc      Pain Education      Exclude from Growth Chart    No data found.  Updated Vital Signs BP 128/82 (BP Location: Right Arm)   Pulse 60   Temp 98.6 F (37 C) (Oral)   Resp 16   LMP 10/03/2023 (Exact Date)   SpO2 97%   Visual Acuity Right Eye Distance:   Left Eye Distance:   Bilateral Distance:    Right Eye Near:   Left Eye Near:    Bilateral Near:     Physical Exam Vitals reviewed.  Constitutional:      General: She is awake.     Appearance: Normal appearance. She is well-developed and well-groomed.  HENT:     Head: Normocephalic and atraumatic.  Eyes:     General: Lids are normal. Gaze aligned appropriately.     Extraocular Movements: Extraocular movements intact.     Conjunctiva/sclera: Conjunctivae normal.  Pulmonary:     Effort: Pulmonary effort is normal.  Abdominal:     General: Abdomen is flat. Bowel sounds are increased.     Palpations: Abdomen is soft.     Tenderness: There is no abdominal tenderness. There is no right CVA tenderness, left CVA tenderness or guarding.  Skin:    General: Skin is warm and dry.  Neurological:     Mental Status: She is alert and oriented to person, place, and time.  Psychiatric:        Attention and Perception: Attention and perception normal.        Mood and Affect: Mood and affect normal.        Speech: Speech normal.        Behavior: Behavior normal. Behavior is cooperative.      UC  Treatments / Results  Labs (all labs ordered are listed, but only abnormal results are displayed) Labs Reviewed  POCT URINE DIPSTICK - Abnormal; Notable for the following components:      Result Value   Color, UA red (*)    Clarity, UA turbid (*)    Glucose, UA =100 (*)    Bilirubin, UA small (*)    Ketones, POC UA trace (5) (*)    Blood, UA large (*)    POC PROTEIN,UA >=300 (*)    Urobilinogen, UA 4.0 (*)    Nitrite, UA Positive (*)    Leukocytes, UA Large (3+) (*)    All other components within normal limits  URINE CULTURE  CERVICOVAGINAL ANCILLARY ONLY    EKG   Radiology No results found.  Procedures Procedures (including critical care time)  Medications Ordered in UC Medications - No data to display  Initial Impression / Assessment and Plan / UC Course  I have reviewed the triage vital signs and the nursing notes.  Pertinent labs & imaging results that were available during my care of the patient were reviewed by me and considered in my medical decision making (see chart for details).    Chart review of recent urinalysis and urine cultures from 07/16/2023, 08/06/2023, 09/10/2023 have  demonstrated no bacterial growth despite suspicious urine dip results.  Final Clinical Impressions(s) / UC Diagnoses   Final diagnoses:  Dysuria  Pyuria  Vulvovaginal discomfort   Patient presents today with concerns for dysuria, intermittent difficulty urinating, back pain, vulvovaginal irritation is been ongoing for the last 3 days.  She reports a previous history of recurrent UTIs and acknowledges that urine cultures have come back negative.  Urine dip today is concerning for UTI.  Will go ahead and start Macrobid  p.o. twice daily x 5 days.  Based on the patient's description of vulvovaginal irritation I am concerned for potential yeast infection especially since she has just completed her recent antibiotic course.  Will go ahead and send in Diflucan  and Pyridium  to further assist with  symptoms.  Urine culture results to further dictate management.  Cervicovaginal swab collected to assess for vulvovaginal process as well.  ED and return precautions reviewed and provided in after visit summary.  Follow-up as needed.    Discharge Instructions      Based on your symptoms and results of the urinalysis I believe you have a UTI I recommend the following:  I have sent in a script for Macrobid  100 mg to be taken by mouth twice per day for 5 days Please finish the entire course of the antibiotic even if you are feeling better before it is completed unless you develop an allergic reaction or are told by a medical provider to stop taking it. I am also sending in a prescription for medication called Pyridium  for you to take by mouth up to 3 times per day as needed for urinary discomfort.  This medication can turn your urine orange so please do not be alarmed if this occurs. I have also sent in a medication called Diflucan  to assist with vulvovaginal yeast infection.  You can take it by mouth once every 72 hours as needed for symptoms. We have sent a sample of your urine off for a urine culture.  This will help us  determine what bacteria is causing your symptoms as well as the most appropriate antibiotic to treat it.  If we need to make any adjustments to your medication regimen and new medication will be sent to the pharmacy on file and you will be updated via phone call in MyChart. We have also collected a cervicovaginal swab to assess for bacterial vaginosis, yeast, trichomonas, gonorrhea, chlamydia.  We will keep you updated on those results as well as any medications that are indicated by those results once it is available. Stay well hydrated (at least 75 oz of water per day) and avoid holding your urine for prolonged periods of time. If you have any of the following please return to urgent care or go to the emergency room: Persistent symptoms, fever, trouble urinating or inability to  urinate, confusion, flank pain.       ED Prescriptions     Medication Sig Dispense Auth. Provider   nitrofurantoin , macrocrystal-monohydrate, (MACROBID ) 100 MG capsule Take 1 capsule (100 mg total) by mouth 2 (two) times daily for 5 days. 10 capsule Edna Grover E, [name]   phenazopyridine  (PYRIDIUM ) 100 MG tablet Take 1 tablet (100 mg total) by mouth 3 (three) times daily as needed for pain. 10 tablet Sundus Pete E, [name]   fluconazole  (DIFLUCAN ) 150 MG tablet Take 1 tablet (150 mg total) by mouth every three (3) days as needed. May repeat in 3 days if symptoms not resolved 2 tablet Nasirah Sachs E, [name]  PDMP not reviewed this encounter.   Marylene Rocky BRAVO, [name] 10/08/23 1110

## 2023-10-08 NOTE — ED Triage Notes (Addendum)
 Pt presents to urgent care with complaints of recurrent UTI. States this is common for her and could not get an appointment with urology until November 04, 2023. Currently experiencing intermittent lower back pain, pain with urination, and frequency x 3 days. Currently rates pain a 7/10. On menstrual cycle. Could not sleep last night due to the pain.

## 2023-10-10 LAB — URINE CULTURE: Culture: NO GROWTH

## 2023-10-11 ENCOUNTER — Ambulatory Visit (HOSPITAL_BASED_OUTPATIENT_CLINIC_OR_DEPARTMENT_OTHER): Payer: Self-pay | Admitting: Anesthesiology

## 2023-10-11 ENCOUNTER — Ambulatory Visit (HOSPITAL_BASED_OUTPATIENT_CLINIC_OR_DEPARTMENT_OTHER)
Admission: RE | Admit: 2023-10-11 | Discharge: 2023-10-11 | Disposition: A | Discharge status: Home / Self Care | Attending: Plastic Surgery | Admitting: Plastic Surgery

## 2023-10-11 ENCOUNTER — Encounter (HOSPITAL_BASED_OUTPATIENT_CLINIC_OR_DEPARTMENT_OTHER)
Admission: RE | Disposition: A | Discharge status: Home / Self Care | Payer: Self-pay | Source: Home / Self Care | Attending: Plastic Surgery

## 2023-10-11 ENCOUNTER — Ambulatory Visit (HOSPITAL_COMMUNITY): Payer: Self-pay

## 2023-10-11 ENCOUNTER — Other Ambulatory Visit: Payer: Self-pay

## 2023-10-11 ENCOUNTER — Encounter (HOSPITAL_BASED_OUTPATIENT_CLINIC_OR_DEPARTMENT_OTHER): Payer: Self-pay | Admitting: Plastic Surgery

## 2023-10-11 DIAGNOSIS — Z9851 Tubal ligation status: Secondary | ICD-10-CM | POA: Diagnosis not present

## 2023-10-11 DIAGNOSIS — Z9884 Bariatric surgery status: Secondary | ICD-10-CM | POA: Diagnosis not present

## 2023-10-11 DIAGNOSIS — F419 Anxiety disorder, unspecified: Secondary | ICD-10-CM

## 2023-10-11 DIAGNOSIS — F17200 Nicotine dependence, unspecified, uncomplicated: Secondary | ICD-10-CM | POA: Diagnosis not present

## 2023-10-11 DIAGNOSIS — J449 Chronic obstructive pulmonary disease, unspecified: Secondary | ICD-10-CM | POA: Diagnosis not present

## 2023-10-11 DIAGNOSIS — M793 Panniculitis, unspecified: Secondary | ICD-10-CM | POA: Diagnosis present

## 2023-10-11 DIAGNOSIS — Z6826 Body mass index (BMI) 26.0-26.9, adult: Secondary | ICD-10-CM | POA: Insufficient documentation

## 2023-10-11 DIAGNOSIS — Z87891 Personal history of nicotine dependence: Secondary | ICD-10-CM | POA: Insufficient documentation

## 2023-10-11 DIAGNOSIS — Z419 Encounter for procedure for purposes other than remedying health state, unspecified: Secondary | ICD-10-CM

## 2023-10-11 DIAGNOSIS — G4733 Obstructive sleep apnea (adult) (pediatric): Secondary | ICD-10-CM | POA: Insufficient documentation

## 2023-10-11 DIAGNOSIS — E669 Obesity, unspecified: Secondary | ICD-10-CM | POA: Diagnosis not present

## 2023-10-11 HISTORY — PX: PANNICULECTOMY: SHX5360

## 2023-10-11 LAB — CERVICOVAGINAL ANCILLARY ONLY
Bacterial Vaginitis (gardnerella): NEGATIVE
Candida Glabrata: NEGATIVE
Candida Vaginitis: POSITIVE — AB
Chlamydia: NEGATIVE
Comment: NEGATIVE
Comment: NEGATIVE
Comment: NEGATIVE
Comment: NEGATIVE
Comment: NEGATIVE
Comment: NORMAL
Neisseria Gonorrhea: NEGATIVE
Trichomonas: NEGATIVE

## 2023-10-11 LAB — POCT PREGNANCY, URINE: Preg Test, Ur: NEGATIVE

## 2023-10-11 SURGERY — PANNICULECTOMY
Anesthesia: General | Site: Abdomen

## 2023-10-11 MED ORDER — KETAMINE HCL 50 MG/5ML IJ SOSY
PREFILLED_SYRINGE | INTRAMUSCULAR | Status: AC
  Filled 2023-10-11: qty 5

## 2023-10-11 MED ORDER — ROCURONIUM BROMIDE 100 MG/10ML IV SOLN
INTRAVENOUS | Status: DC | PRN
  Administered 2023-10-11 (×2): 50 mg via INTRAVENOUS

## 2023-10-11 MED ORDER — OXYCODONE HCL 5 MG/5ML PO SOLN
ORAL | Status: AC
  Filled 2023-10-11: qty 5

## 2023-10-11 MED ORDER — CEFAZOLIN SODIUM-DEXTROSE 2-4 GM/100ML-% IV SOLN
INTRAVENOUS | Status: AC
  Filled 2023-10-11: qty 100

## 2023-10-11 MED ORDER — PROPOFOL 10 MG/ML IV BOLUS
INTRAVENOUS | Status: AC
  Filled 2023-10-11: qty 20

## 2023-10-11 MED ORDER — SUGAMMADEX SODIUM 200 MG/2ML IV SOLN
INTRAVENOUS | Status: DC | PRN
Start: 2023-10-11 — End: 2023-10-11
  Administered 2023-10-11 (×2): 200 mg via INTRAVENOUS

## 2023-10-11 MED ORDER — LIDOCAINE 2% (20 MG/ML) 5 ML SYRINGE
INTRAMUSCULAR | Status: AC
  Filled 2023-10-11: qty 5

## 2023-10-11 MED ORDER — CHLORHEXIDINE GLUCONATE CLOTH 2 % EX PADS: 6.0000 | MEDICATED_PAD | Freq: Once | CUTANEOUS | Status: DC

## 2023-10-11 MED ORDER — GLYCOPYRROLATE 0.2 MG/ML IJ SOLN
INTRAMUSCULAR | Status: DC | PRN
  Administered 2023-10-11 (×2): .2 mg via INTRAVENOUS

## 2023-10-11 MED ORDER — OXYCODONE HCL 5 MG/5ML PO SOLN
5.0000 mg | Freq: Once | ORAL | Status: AC | PRN
  Administered 2023-10-11 (×2): 5 mg via ORAL

## 2023-10-11 MED ORDER — SCOPOLAMINE 1 MG/3DAYS TD PT72
1.0000 | MEDICATED_PATCH | TRANSDERMAL | Status: DC
  Administered 2023-10-11 (×2): 1.5 mg via TRANSDERMAL

## 2023-10-11 MED ORDER — DEXAMETHASONE SODIUM PHOSPHATE 10 MG/ML IJ SOLN
INTRAMUSCULAR | Status: AC
  Filled 2023-10-11: qty 1

## 2023-10-11 MED ORDER — AMISULPRIDE (ANTIEMETIC) 5 MG/2ML IV SOLN: 10.0000 mg | Freq: Once | INTRAVENOUS | Status: DC | PRN

## 2023-10-11 MED ORDER — OXYCODONE HCL 5 MG PO TABS: 5.0000 mg | ORAL_TABLET | Freq: Once | ORAL | Status: AC | PRN

## 2023-10-11 MED ORDER — EPHEDRINE 5 MG/ML INJ
INTRAVENOUS | Status: AC
  Filled 2023-10-11: qty 5

## 2023-10-11 MED ORDER — EPHEDRINE SULFATE (PRESSORS) 50 MG/ML IJ SOLN
INTRAMUSCULAR | Status: DC | PRN
  Administered 2023-10-11: 5 mg via INTRAVENOUS
  Administered 2023-10-11: 10 mg via INTRAVENOUS
  Administered 2023-10-11: 5 mg via INTRAVENOUS
  Administered 2023-10-11 (×3): 10 mg via INTRAVENOUS

## 2023-10-11 MED ORDER — ONDANSETRON HCL 4 MG/2ML IJ SOLN
INTRAMUSCULAR | Status: AC
  Filled 2023-10-11: qty 2

## 2023-10-11 MED ORDER — ONDANSETRON HCL 4 MG/2ML IJ SOLN
INTRAMUSCULAR | Status: DC | PRN
  Administered 2023-10-11 (×2): 4 mg via INTRAVENOUS

## 2023-10-11 MED ORDER — KETOROLAC TROMETHAMINE 30 MG/ML IJ SOLN
INTRAMUSCULAR | Status: DC | PRN
  Administered 2023-10-11 (×2): 30 mg via INTRAVENOUS

## 2023-10-11 MED ORDER — LACTATED RINGERS IV SOLN: INTRAVENOUS | Status: DC

## 2023-10-11 MED ORDER — ACETAMINOPHEN 500 MG PO TABS
ORAL_TABLET | ORAL | Status: AC
  Filled 2023-10-11: qty 2

## 2023-10-11 MED ORDER — ACETAMINOPHEN 500 MG PO TABS: 1000.0000 mg | ORAL_TABLET | Freq: Once | ORAL | Status: DC

## 2023-10-11 MED ORDER — SCOPOLAMINE 1 MG/3DAYS TD PT72: 1.0000 | MEDICATED_PATCH | TRANSDERMAL | Status: DC

## 2023-10-11 MED ORDER — HYDROMORPHONE HCL 1 MG/ML IJ SOLN: 0.2500 mg | INTRAMUSCULAR | Status: DC | PRN

## 2023-10-11 MED ORDER — SCOPOLAMINE 1 MG/3DAYS TD PT72
MEDICATED_PATCH | TRANSDERMAL | Status: AC
  Filled 2023-10-11: qty 1

## 2023-10-11 MED ORDER — OXYCODONE HCL 5 MG PO TABS
ORAL_TABLET | ORAL | Status: AC
Start: 2023-10-11 — End: 2023-10-11
  Filled 2023-10-11: qty 1

## 2023-10-11 MED ORDER — 0.9 % SODIUM CHLORIDE (POUR BTL) OPTIME
TOPICAL | Status: DC | PRN
  Administered 2023-10-11 (×4): 1000 mL

## 2023-10-11 MED ORDER — FENTANYL CITRATE (PF) 100 MCG/2ML IJ SOLN
INTRAMUSCULAR | Status: DC | PRN
  Administered 2023-10-11 (×4): 50 ug via INTRAVENOUS

## 2023-10-11 MED ORDER — CEFAZOLIN SODIUM-DEXTROSE 2-4 GM/100ML-% IV SOLN
2.0000 g | INTRAVENOUS | Status: AC
  Administered 2023-10-11 (×2): 2 g via INTRAVENOUS

## 2023-10-11 MED ORDER — KETAMINE HCL 10 MG/ML IJ SOLN
INTRAMUSCULAR | Status: DC | PRN
Start: 2023-10-11 — End: 2023-10-11
  Administered 2023-10-11 (×2): 50 mg via INTRAVENOUS

## 2023-10-11 MED ORDER — ROCURONIUM BROMIDE 10 MG/ML (PF) SYRINGE
PREFILLED_SYRINGE | INTRAVENOUS | Status: AC
  Filled 2023-10-11: qty 10

## 2023-10-11 MED ORDER — DEXMEDETOMIDINE HCL IN NACL 80 MCG/20ML IV SOLN
INTRAVENOUS | Status: AC
  Filled 2023-10-11: qty 20

## 2023-10-11 MED ORDER — GLYCOPYRROLATE PF 0.2 MG/ML IJ SOSY
PREFILLED_SYRINGE | INTRAMUSCULAR | Status: AC
  Filled 2023-10-11: qty 1

## 2023-10-11 MED ORDER — ARTIFICIAL TEARS OPHTHALMIC OINT
TOPICAL_OINTMENT | OPHTHALMIC | Status: AC
  Filled 2023-10-11: qty 10.5

## 2023-10-11 MED ORDER — MIDAZOLAM HCL 2 MG/2ML IJ SOLN
INTRAMUSCULAR | Status: AC
Start: 2023-10-11 — End: 2023-10-11
  Filled 2023-10-11: qty 2

## 2023-10-11 MED ORDER — DEXAMETHASONE SODIUM PHOSPHATE 4 MG/ML IJ SOLN
INTRAMUSCULAR | Status: DC | PRN
Start: 2023-10-11 — End: 2023-10-11
  Administered 2023-10-11 (×2): 10 mg via INTRAVENOUS

## 2023-10-11 MED ORDER — SODIUM CHLORIDE (PF) 0.9 % IJ SOLN
INTRAMUSCULAR | Status: DC | PRN
  Administered 2023-10-11 (×2): 100 mL

## 2023-10-11 MED ORDER — FENTANYL CITRATE (PF) 100 MCG/2ML IJ SOLN
INTRAMUSCULAR | Status: AC
Start: 2023-10-11 — End: 2023-10-11
  Filled 2023-10-11: qty 2

## 2023-10-11 MED ORDER — PROPOFOL 10 MG/ML IV BOLUS
INTRAVENOUS | Status: DC | PRN
  Administered 2023-10-11 (×2): 120 mg via INTRAVENOUS

## 2023-10-11 MED ORDER — MIDAZOLAM HCL 5 MG/5ML IJ SOLN
INTRAMUSCULAR | Status: DC | PRN
  Administered 2023-10-11 (×2): 2 mg via INTRAVENOUS

## 2023-10-11 MED ORDER — DEXMEDETOMIDINE HCL IN NACL 80 MCG/20ML IV SOLN
INTRAVENOUS | Status: DC | PRN
Start: 2023-10-11 — End: 2023-10-11
  Administered 2023-10-11: 4 ug via INTRAVENOUS
  Administered 2023-10-11: 8 ug via INTRAVENOUS
  Administered 2023-10-11 (×3): 4 ug via INTRAVENOUS
  Administered 2023-10-11: 8 ug via INTRAVENOUS

## 2023-10-11 MED ORDER — LIDOCAINE HCL (CARDIAC) PF 100 MG/5ML IV SOSY
PREFILLED_SYRINGE | INTRAVENOUS | Status: DC | PRN
  Administered 2023-10-11 (×2): 80 mg via INTRAVENOUS

## 2023-10-11 SURGICAL SUPPLY — 55 items
BINDER ABDOMINAL 12 ML 46-62 (SOFTGOODS) IMPLANT
BINDER ABDOMINAL 12 SM 30-45 (SOFTGOODS) IMPLANT
BIOPATCH RED 1 DISK 7.0 (GAUZE/BANDAGES/DRESSINGS) ×2 IMPLANT
BLADE CLIPPER SURG (BLADE) IMPLANT
BLADE SURG 10 STRL SS (BLADE) ×4 IMPLANT
BLADE SURG 11 STRL SS (BLADE) IMPLANT
BLADE SURG 15 STRL LF DISP TIS (BLADE) ×1 IMPLANT
CLIP APPLIE 9.375 MED OPEN (MISCELLANEOUS) IMPLANT
DERMABOND ADVANCED .7 DNX12 (GAUZE/BANDAGES/DRESSINGS) ×2 IMPLANT
DRAIN CHANNEL 19F RND (DRAIN) ×2 IMPLANT
DRAPE UTILITY XL STRL (DRAPES) ×1 IMPLANT
DRSG TEGADERM 4X4.75 (GAUZE/BANDAGES/DRESSINGS) ×2 IMPLANT
ELECT COATED BLADE 2.86 ST (ELECTRODE) IMPLANT
ELECTRODE BLDE 4.0 EZ CLN MEGD (MISCELLANEOUS) ×1 IMPLANT
ELECTRODE REM PT RTRN 9FT ADLT (ELECTROSURGICAL) ×2 IMPLANT
EVACUATOR SILICONE 100CC (DRAIN) ×2 IMPLANT
GAUZE PAD ABD 8X10 STRL (GAUZE/BANDAGES/DRESSINGS) ×2 IMPLANT
GAUZE SPONGE 2X2 STRL 8-PLY (GAUZE/BANDAGES/DRESSINGS) ×2 IMPLANT
GLOVE BIO SURGEON STRL SZ 6.5 (GLOVE) IMPLANT
GLOVE BIO SURGEON STRL SZ7.5 (GLOVE) ×1 IMPLANT
GLOVE BIO SURGEON STRL SZ8 (GLOVE) ×2 IMPLANT
GLOVE BIOGEL PI IND STRL 7.0 (GLOVE) IMPLANT
GLOVE BIOGEL PI IND STRL 8 (GLOVE) ×2 IMPLANT
GOWN STRL REUS W/ TWL LRG LVL3 (GOWN DISPOSABLE) ×1 IMPLANT
GOWN STRL REUS W/TWL XL LVL3 (GOWN DISPOSABLE) ×2 IMPLANT
HEMOSTAT ARISTA ABSORB 3G PWDR (HEMOSTASIS) IMPLANT
HIBICLENS CHG 4% 4OZ BTL (MISCELLANEOUS) ×1 IMPLANT
MANIFOLD NEPTUNE II (INSTRUMENTS) ×1 IMPLANT
NDL HYPO 22X1.5 SAFETY MO (MISCELLANEOUS) ×2 IMPLANT
NEEDLE HYPO 22X1.5 SAFETY MO (MISCELLANEOUS) ×2 IMPLANT
NS IRRIG 1000ML POUR BTL (IV SOLUTION) ×1 IMPLANT
PACK BASIN DAY SURGERY FS (CUSTOM PROCEDURE TRAY) ×1 IMPLANT
PACK UNIVERSAL I (CUSTOM PROCEDURE TRAY) ×1 IMPLANT
PENCIL SMOKE EVACUATOR (MISCELLANEOUS) ×2 IMPLANT
PIN SAFETY STERILE (MISCELLANEOUS) ×1 IMPLANT
POWDER SURGICEL 3.0 GRAM (HEMOSTASIS) IMPLANT
SLEEVE SCD COMPRESS KNEE MED (STOCKING) ×1 IMPLANT
SPONGE T-LAP 18X18 ~~LOC~~+RFID (SPONGE) ×2 IMPLANT
STAPLER INSORB 30 2030 C-SECTI (MISCELLANEOUS) IMPLANT
STAPLER SKIN PROX WIDE 3.9 (STAPLE) ×1 IMPLANT
SUT MNCRL AB 3-0 PS2 27 (SUTURE) ×4 IMPLANT
SUT MNCRL AB 4-0 PS2 18 (SUTURE) ×4 IMPLANT
SUT SILK 2 0 PERMA HAND 18 BK (SUTURE) ×2 IMPLANT
SUT VIC AB 2-0 CT1 TAPERPNT 27 (SUTURE) ×4 IMPLANT
SUT VIC AB 3-0 PS1 18XBRD (SUTURE) IMPLANT
SUT VIC AB 3-0 SH 27X BRD (SUTURE) ×1 IMPLANT
SUT VICRYL AB 3 0 TIES (SUTURE) IMPLANT
SYR 20ML LL LF (SYRINGE) ×2 IMPLANT
SYR BULB IRRIG 60ML STRL (SYRINGE) ×1 IMPLANT
SYR CONTROL 10ML LL (SYRINGE) ×1 IMPLANT
TOWEL GREEN STERILE FF (TOWEL DISPOSABLE) ×2 IMPLANT
TRAY DSU PREP LF (CUSTOM PROCEDURE TRAY) ×1 IMPLANT
TUBE CONNECTING 20X1/4 (TUBING) ×1 IMPLANT
UNDERPAD 30X36 HEAVY ABSORB (UNDERPADS AND DIAPERS) ×2 IMPLANT
YANKAUER SUCT BULB TIP NO VENT (SUCTIONS) ×1 IMPLANT

## 2023-10-11 NOTE — Discharge Instructions (Addendum)
 INSTRUCTIONS FOR AFTER ABDOMINAL SURGERY   You will likely have some questions about what to expect following your operation.  The following information will help you and your family understand what to expect when you are discharged from the hospital.  It is important to follow these guidelines to help ensure a smooth recovery and reduce complication.  Postoperative instructions include information on: diet, wound care, medications and physical activity.  AFTER SURGERY Expect to go home after the procedure.  In some cases, you may need to spend one night in the hospital for observation.  DIET Abdominal surgery does not require a specific diet.  However, the healthier you eat the better your body will heal. It is important to increasing your protein intake.  This means limiting the foods with sugar and carbohydrates.  Focus on vegetables and some meat.  If you have liposuction during your procedure be sure to drink water.  If your urine is bright yellow, then it is concentrated, and you need to drink more water.  As a general rule after surgery, you should have 8 ounces of water every hour while awake.  If you find you are persistently nauseated or unable to take in liquids let us  know.  NO TOBACCO USE or EXPOSURE.  This will slow your healing process and lead to a wound.  WOUND CARE Leave the binder on at all times except when showering . Use fragrance free soap like Dial, Dove or [place].   After 24 hours you can remove the binder to shower. Once dry apply binder or compressive spanx. No baths, pools or hot tubs for four weeks. We close your incision to leave the smallest and best-looking scar. No ointment or creams on your incisions for four weeks.  No Neosporin (Too many skin reactions).  A few weeks after surgery you can use Mederma and start massaging the scar. We ask you to wear your binder or compressive spanx for the first 6 weeks around the clock, including while sleeping. This provides added  comfort and helps reduce the fluid accumulation at the surgery site. NO Ice or heating pads to the operative site.  You have a very high risk of a BURN before you feel the temperature change. Continue to empty, recharge, & record drainage from drains 2-3 times a day, as needed.  ACTIVITY No heavy lifting until cleared by the doctor.  This usually means no more than a half-gallon of milk.  It is OK to walk and climb stairs. Moving your legs is very important to decrease your risk of a blood clot.  It will also help keep you from getting deconditioned.  Every 1 to 2 hours get up and walk for 5 minutes. This will help with a quicker recovery back to normal.  Let pain be your guide so you don't do too much.  This time is for you to recover.  You will be more comfortable if you sleep and rest with your head elevated either with a few pillows under you or in a recliner.  No stomach sleeping for a three months.  WORK Everyone returns to work at different times. As a rough guide, most people take at least 1 - 2 weeks off prior to returning to work. If you need documentation for your job, give the forms to the front staff at the clinic.  DRIVING Arrange for someone to bring you home from the hospital after your surgery.  You may be able to drive a few days after  surgery but not while taking any narcotics or valium.  BOWEL MOVEMENTS Constipation can occur after anesthesia and while taking pain medication.  It is important to stay ahead for your comfort.  We recommend taking Milk of Magnesia (2 tablespoons; twice a day) while taking the pain pills.  MEDICATIONS You may be prescribed should start after surgery At your preoperative visit for you history and physical you may have been given the following medications: Zofran  4 mg:  This is to treat nausea and vomiting.  You can take this every 6 hours as needed and only if needed. 2.   Oxycodone  5 mg:  This is only to be used after you have taken the Motrin or  the Tylenol . Every 8 hours as needed. 3.   Lovenox  .4 mLs: Begin the first dose nighttime the day of surgery, and take every 24 hours after as directed for 1 week.    Over the counter Medication to take: Ibuprofen (Motrin) 600 mg:  Take this every 6 hours.  If you have additional pain then take 500 mg of the Tylenol  every 8 hours.  Only take the Norco after you have tried these two. MiraLAX or Milk of Magnesia: Take this according to the bottle if you take the Norco.  WHEN TO CALL Call your surgeon's office if any of the following occur: Fever 101 degrees F or greater Excessive bleeding or fluid from the incision site. Pain that increases over time without aid from the medications Redness, warmth, or pus draining from incision sites Persistent nausea or inability to take in liquids Severe misshapen area that underwent the operation.    Post Anesthesia Home Care Instructions  Activity: Get plenty of rest for the remainder of the day. A responsible individual must stay with you for 24 hours following the procedure.  For the next 24 hours, DO NOT: -Drive a car -Advertising copywriter -Drink alcoholic beverages -Take any medication unless instructed by your physician -Make any legal decisions or sign important papers.  Meals: Start with liquid foods such as gelatin or soup. Progress to regular foods as tolerated. Avoid greasy, spicy, heavy foods. If nausea and/or vomiting occur, drink only clear liquids until the nausea and/or vomiting subsides. Call your physician if vomiting continues.  Special Instructions/Symptoms: Your throat may feel dry or sore from the anesthesia or the breathing tube placed in your throat during surgery. If this causes discomfort, gargle with warm salt water. The discomfort should disappear within 24 hours.  If you had a scopolamine  patch placed behind your ear for the management of post- operative nausea and/or vomiting:  1. The medication in the patch is  effective for 72 hours, after which it should be removed.  Wrap patch in a tissue and discard in the trash. Wash hands thoroughly with soap and water. 2. You may remove the patch earlier than 72 hours if you experience unpleasant side effects which may include dry mouth, dizziness or visual disturbances. 3. Avoid touching the patch. Wash your hands with soap and water after contact with the patch.  About my Jackson-Pratt Bulb Drain  What is a Jackson-Pratt bulb? A Jackson-Pratt is a soft, round device used to collect drainage. It is connected to a long, thin drainage catheter, which is held in place by one or two small stiches near your surgical incision site. When the bulb is squeezed, it forms a vacuum, forcing the drainage to empty into the bulb.  Emptying the Jackson-Pratt bulb- To empty the bulb: 1. Release  the plug on the top of the bulb. 2. Pour the bulb's contents into a measuring container which your nurse will provide. 3. Record the time emptied and amount of drainage. Empty the drain(s) as often as your     doctor or nurse recommends.  Date                  Time                    Amount (Drain 1)                 Amount (Drain 2)  _____________________________________________________________________  _____________________________________________________________________  _____________________________________________________________________  _____________________________________________________________________  _____________________________________________________________________  _____________________________________________________________________  _____________________________________________________________________  _____________________________________________________________________  Squeezing the Jackson-Pratt Bulb- To squeeze the bulb: 1. Make sure the plug at the top of the bulb is open. 2. Squeeze the bulb tightly in your fist. You will hear air squeezing from  the bulb. 3. Replace the plug while the bulb is squeezed. 4. Use a safety pin to attach the bulb to your clothing. This will keep the catheter from     pulling at the bulb insertion site.  When to call your doctor- Call your doctor if: Drain site becomes red, swollen or hot. You have a fever greater than 101 degrees F. There is oozing at the drain site. Drain falls out (apply a guaze bandage over the drain hole and secure it with tape). Drainage increases daily not related to activity patterns. (You will usually have more drainage when you are active than when you are resting.) Drainage has a bad odor.    No tylenol  until after 7pm if needed.  You may take ibuprofen after 11pm today.

## 2023-10-11 NOTE — Interval H&P Note (Signed)
 History and Physical Interval Note: No change in indication for surgery. Does have a bruise on the posterior aspect of her left calf where she fell. No pain and no Homans. All questions answered. She has picked up her Lovenox  and understands how to use it Site marked with her concurrence Will proceed with a panniculectomy at her request   10/11/2023 12:42 PM  Kathy Burns  has presented today for surgery, with the diagnosis of n62.  The various methods of treatment have been discussed with the patient and family. After consideration of risks, benefits and other options for treatment, the patient has consented to  Procedure(s): PANNICULECTOMY (N/A) as a surgical intervention.  The patient's history has been reviewed, patient examined, no change in status, stable for surgery.  I have reviewed the patient's chart and labs.  Questions were answered to the patient's satisfaction.     Leonce [name]

## 2023-10-11 NOTE — Op Note (Signed)
 DATE OF OPERATION: 10/11/2023  LOCATION: [facility] operating Room  PREOPERATIVE DIAGNOSIS: Panniculitis  POSTOPERATIVE DIAGNOSIS: Same  PROCEDURE: Panniculectomy  SURGEON: Marinell Birmingham, MD  ASSISTANT: Estefana Peck  EBL: 75 cc  CONDITION: Stable  COMPLICATIONS: None  INDICATION: The patient, Kathy Burns, is a 38 y.o. female born on [birth year], is here for treatment of ongoing rashes on the posterior aspect of the pannus.   PROCEDURE DETAILS:  The patient was seen prior to surgery and marked.  The IV antibiotics were given. The patient was taken to the operating room and given a general anesthetic. A standard time out was performed and all information was confirmed by those in the room. SCDs were placed.   The abdomen is prepped and draped in the usual sterile manner.  A transverse incision was made across the lower portion of the abdomen sharply and the electrocautery was used to dissected the soft tissues down to the anterior abdominal wall.  The skin and fat was then elevated from the anterior abdominal wall fascia in a caudad to cranial manner to the level of the umbilicus.  The counterincision at the top of the pannus was identified and made sharply.  The pannus was removed.  Meticulous hemostasis was achieved with the electrocautery.  The subfascial space over the rectus muscle and the external obliques was infiltrated with a mixture of Exparel , quarter percent plain Marcaine , saline.  The subcutaneous tissues were also infiltrated with this mixture.  100 mL of the mixture was used.  A 19 French round drain was placed on either side of the abdomen and brought out through separate stab incisions.  The wound surfaces were coated with Surgicel hemostatic agent.  The skin edges were tailor tacked in place and the Scarpa's fascia approximated with interrupted 2-0 Vicryl sutures.  The dermis was then closed with interrupted and running 3-0 Monocryl sutures and the skin was closed with  a running 4-0 Monocryl subcuticular stitch.  The incisions were sealed with Dermabond and the patient was placed in a compressive garment.  She was awakened from anesthesia without incident and transferred to the recovery room in good condition.  All instrument needle and sponge counts were reported as correct.  The patient has an elevated Caprini score and will be started on Lovenox  this evening. The patient was allowed to wake up and taken to recovery room in stable condition at the end of the case. The family was notified at the end of the case.   The advanced practice practitioner (APP) assisted throughout the case.  The APP was essential in retraction and counter traction when needed to make the case progress smoothly.  This retraction and assistance made it possible to see the tissue plans for the procedure.  The assistance was needed for blood control, tissue re-approximation and assisted with closure of the incision site.

## 2023-10-11 NOTE — Transfer of Care (Signed)
 Immediate Anesthesia Transfer of Care Note  Patient: Kathy Burns  Procedure(s) Performed: PANNICULECTOMY (Abdomen)  Patient Location: PACU  Anesthesia Type:General  Level of Consciousness: drowsy and patient cooperative  Airway & Oxygen Therapy: Patient Spontanous Breathing and Patient connected to face mask oxygen  Post-op Assessment: Report given to RN and Post -op Vital signs reviewed and stable  Post vital signs: Reviewed and stable  Last Vitals:  Vitals Value Taken Time  BP 100/49 10/11/23 15:31  Temp    Pulse 70 10/11/23 15:37  Resp 9 10/11/23 15:37  SpO2 99 % 10/11/23 15:37  Vitals shown include unfiled device data.  Last Pain:  Vitals:   10/11/23 1119  TempSrc: Temporal  PainSc: 0-No pain      Patients Stated Pain Goal: 5 (10/11/23 1119)  Complications: No notable events documented.

## 2023-10-11 NOTE — Anesthesia Postprocedure Evaluation (Signed)
 Anesthesia Post Note  Patient: Kathy Burns  Procedure(s) Performed: PANNICULECTOMY (Abdomen)     Patient location during evaluation: PACU Anesthesia Type: General Level of consciousness: awake and alert and oriented Pain management: pain level controlled Vital Signs Assessment: post-procedure vital signs reviewed and stable Respiratory status: spontaneous breathing, nonlabored ventilation and respiratory function stable Cardiovascular status: blood pressure returned to baseline and stable Postop Assessment: no apparent nausea or vomiting Anesthetic complications: no   No notable events documented.  Last Vitals:  Vitals:   10/11/23 1710 10/11/23 1715  BP: 108/70   Pulse: 68   Resp: 18   Temp: 36.5 C   SpO2: 92% 93%    Last Pain:  Vitals:   10/11/23 1710  TempSrc: Temporal  PainSc: 5                  Precious Segall A.

## 2023-10-11 NOTE — Anesthesia Procedure Notes (Addendum)
 Procedure Name: Intubation Date/Time: 10/11/2023 1:12 PM  Performed by: Pam Macario BROCKS, CRNAPre-anesthesia Checklist: Patient identified, Emergency Drugs available, Suction available, Patient being monitored and Timeout performed Patient Re-evaluated:Patient Re-evaluated prior to induction Oxygen Delivery Method: Circle system utilized Preoxygenation: Pre-oxygenation with 100% oxygen Induction Type: IV induction Ventilation: Mask ventilation without difficulty Laryngoscope Size: Mac and 3 Grade View: Grade II Tube type: Oral Tube size: 7.0 mm Number of attempts: 1 Airway Equipment and Method: Stylet and Oral airway Placement Confirmation: ETT inserted through vocal cords under direct vision, positive ETCO2, breath sounds checked- equal and bilateral and CO2 detector Secured at: 23 cm Tube secured with: Tape Dental Injury: Teeth and Oropharynx as per pre-operative assessment  Comments: Noted congential crease in lower lip - as preop - no problems with airway management

## 2023-10-11 NOTE — Interval H&P Note (Signed)
 History and Physical Interval Note:  10/11/2023 12:42 PM  Kathy Burns  has presented today for surgery, with the diagnosis of n62.  The various methods of treatment have been discussed with the patient and family. After consideration of risks, benefits and other options for treatment, the patient has consented to  Procedure(s): PANNICULECTOMY (N/A) as a surgical intervention.  The patient's history has been reviewed, patient examined, no change in status, stable for surgery.  I have reviewed the patient's chart and labs.  Questions were answered to the patient's satisfaction.     Leonce [name]

## 2023-10-11 NOTE — Anesthesia Preprocedure Evaluation (Addendum)
 Anesthesia Evaluation  Patient identified by MRN, date of birth, ID band Patient awake    Reviewed: Allergy & Precautions, NPO status , Patient's Chart, lab work & pertinent test results  Airway Mallampati: II  TM Distance: >3 FB     Dental  (+) Dental Advisory Given, Missing, Upper Dentures   Pulmonary sleep apnea , COPD,  COPD inhaler, Current Smoker and Patient abstained from smoking. 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 Last alcohol use 1 month ago Hx/o gastric bypass   Endo/Other  Macromastia  Renal/GU negative Renal ROS  negative genitourinary   Musculoskeletal negative musculoskeletal ROS (+)  narcotic dependent  Abdominal   Peds  Hematology negative hematology ROS (+)   Anesthesia Other Findings   Reproductive/Obstetrics negative OB ROS                              Anesthesia Physical Anesthesia Plan  ASA: 3  Anesthesia Plan: General   Post-op Pain Management: Dilaudid  IV, Precedex  and Tylenol  PO (pre-op)*   Induction: Intravenous  PONV Risk Score and Plan: 3 and Treatment may vary due to age or medical condition, Midazolam , Propofol  infusion, Ondansetron , Dexamethasone  and Scopolamine  patch - Pre-op  Airway Management Planned: Oral ETT  Additional Equipment: None  Intra-op Plan:   Post-operative Plan: Extubation in OR  Informed Consent: I have reviewed the patients History and Physical, chart, labs and discussed  the procedure including the risks, benefits and alternatives for the proposed anesthesia with the patient or authorized representative who has indicated his/her understanding and acceptance.     Dental advisory given  Plan Discussed with: CRNA and Anesthesiologist  Anesthesia Plan Comments:          Anesthesia Quick Evaluation

## 2023-10-12 ENCOUNTER — Encounter (HOSPITAL_BASED_OUTPATIENT_CLINIC_OR_DEPARTMENT_OTHER): Payer: Self-pay | Admitting: Plastic Surgery

## 2023-10-12 ENCOUNTER — Emergency Department (HOSPITAL_COMMUNITY)
Admission: EM | Admit: 2023-10-12 | Discharge: 2023-10-13 | Disposition: A | Discharge status: Home / Self Care | Attending: Emergency Medicine | Admitting: Emergency Medicine

## 2023-10-12 DIAGNOSIS — R2243 Localized swelling, mass and lump, lower limb, bilateral: Secondary | ICD-10-CM | POA: Insufficient documentation

## 2023-10-12 DIAGNOSIS — T819XXA Unspecified complication of procedure, initial encounter: Secondary | ICD-10-CM | POA: Insufficient documentation

## 2023-10-12 DIAGNOSIS — M7989 Other specified soft tissue disorders: Secondary | ICD-10-CM

## 2023-10-12 DIAGNOSIS — J449 Chronic obstructive pulmonary disease, unspecified: Secondary | ICD-10-CM | POA: Diagnosis not present

## 2023-10-12 NOTE — ED Triage Notes (Signed)
 Pt states that she had skin removal surgery on Monday, tonight noticed swelling in both of her legs, worse on the L leg, oxygen drops to the 80's on RA, denies SOB, pt was discharged on 40mg  of Lovenox . Denies fevers

## 2023-10-13 ENCOUNTER — Encounter (HOSPITAL_COMMUNITY): Payer: Self-pay

## 2023-10-13 ENCOUNTER — Emergency Department (HOSPITAL_COMMUNITY)

## 2023-10-13 ENCOUNTER — Ambulatory Visit: Payer: Self-pay | Admitting: Family

## 2023-10-13 ENCOUNTER — Telehealth (HOSPITAL_COMMUNITY): Payer: Self-pay

## 2023-10-13 LAB — COMPREHENSIVE METABOLIC PANEL WITH GFR
ALT: 22 U/L (ref 0–44)
AST: 25 U/L (ref 15–41)
Albumin: 3.5 g/dL (ref 3.5–5.0)
Alkaline Phosphatase: 43 U/L (ref 38–126)
Anion gap: 7 (ref 5–15)
BUN: 20 mg/dL (ref 6–20)
CO2: 24 mmol/L (ref 22–32)
Calcium: 8.6 mg/dL — ABNORMAL LOW (ref 8.9–10.3)
Chloride: 104 mmol/L (ref 98–111)
Creatinine, Ser: 0.8 mg/dL (ref 0.44–1.00)
GFR, Estimated: >60 mL/min (ref >60)
Glucose, Bld: 125 mg/dL — ABNORMAL HIGH (ref 70–99)
Potassium: 4 mmol/L (ref 3.5–5.1)
Sodium: 135 mmol/L (ref 135–145)
Total Bilirubin: 0.5 mg/dL (ref 0.0–1.2)
Total Protein: 6.5 g/dL (ref 6.5–8.1)

## 2023-10-13 LAB — RAPID URINE DRUG SCREEN, HOSP PERFORMED
Amphetamines: NOT DETECTED
Barbiturates: NOT DETECTED
Benzodiazepines: POSITIVE — AB
Cocaine: NOT DETECTED
Opiates: NOT DETECTED
Tetrahydrocannabinol: POSITIVE — AB

## 2023-10-13 LAB — CBC WITH DIFFERENTIAL/PLATELET
Abs Immature Granulocytes: 0.02 K/uL (ref 0.00–0.07)
Basophils Absolute: 0 K/uL (ref 0.0–0.1)
Basophils Relative: 0 %
Eosinophils Absolute: 0.1 K/uL (ref 0.0–0.5)
Eosinophils Relative: 1 %
HCT: 28.6 % — ABNORMAL LOW (ref 36.0–46.0)
Hemoglobin: 8.3 g/dL — ABNORMAL LOW (ref 12.0–15.0)
Immature Granulocytes: 0 %
Lymphocytes Relative: 13 %
Lymphs Abs: 1 K/uL (ref 0.7–4.0)
MCH: 24.5 pg — ABNORMAL LOW (ref 26.0–34.0)
MCHC: 29 g/dL — ABNORMAL LOW (ref 30.0–36.0)
MCV: 84.4 fL (ref 80.0–100.0)
Monocytes Absolute: 0.6 K/uL (ref 0.1–1.0)
Monocytes Relative: 8 %
Neutro Abs: 6 K/uL (ref 1.7–7.7)
Neutrophils Relative %: 78 %
Platelets: 277 K/uL (ref 150–400)
RBC: 3.39 MIL/uL — ABNORMAL LOW (ref 3.87–5.11)
RDW: 16.2 % — ABNORMAL HIGH (ref 11.5–15.5)
WBC: 7.7 K/uL (ref 4.0–10.5)
nRBC: 0 % (ref 0.0–0.2)

## 2023-10-13 LAB — BRAIN NATRIURETIC PEPTIDE: B Natriuretic Peptide: 26.1 pg/mL (ref 0.0–100.0)

## 2023-10-13 LAB — HCG, SERUM, QUALITATIVE: Preg, Serum: NEGATIVE

## 2023-10-13 LAB — SURGICAL PATHOLOGY

## 2023-10-13 LAB — D-DIMER, QUANTITATIVE: D-Dimer, Quant: 0.51 ug{FEU}/mL — ABNORMAL HIGH (ref 0.00–0.50)

## 2023-10-13 MED ORDER — IOHEXOL 350 MG/ML SOLN
75.0000 mL | Freq: Once | INTRAVENOUS | Status: AC | PRN
  Administered 2023-10-13 (×2): 75 mL via INTRAVENOUS

## 2023-10-13 NOTE — Discharge Instructions (Addendum)
 You were evaluated in the emergency room for leg swelling.  You are provided outpatient DVT study referral.  Please continue your Lovenox  as prescribed.

## 2023-10-13 NOTE — Telephone Encounter (Signed)
 Attempted to contact the patient to schedule VAS US .  Yes answer.  Answered, Scheduled.  First Attempt. Provided  direct contact number for scheduling: [phone].   Patient requested delay based on available times and personal concerns with family member's surgery.

## 2023-10-13 NOTE — ED Provider Notes (Signed)
 [name] [facility] DE[facility]MENT AT [facility] Provider Note   CSN: [id number] Arrival date & time: 10/12/23  2339     Patient presents with: Post-op Problem   Kathy Burns is a 38 y.o. female history of polysubstance abuse, bipolar, anxiety 2 days status post panniculectomy presents with complaints of bilateral leg swelling.  Patient states she woke up today with leg swelling.  Denies any pain.  No numbness or tingling.  No chest pain or shortness of breath.  States she has been compliant with Lovenox .   HPI    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 02/14/2019   Last Assessment & Plan:    Patient is a 38 yo [id number] at [redacted]w[redacted]d with a pregnancy complicated by multiple fetal anomalies, potentially compromising the airway, admitted for extended monitoring and steroids in the setting of BPP 6/10 on day of admission at the MFM office       -  BMZ#1 on 12/15 at 2203, BMZ#2 on 12/16 at 1009   - FHT: category I throughout inpatient admission   - 12/16: BPP 8/8    Past Surgical History:  Procedure Laterality Date   CESAREAN SECTION     x 4   CHOLECYSTECTOMY     GASTRIC BYPASS     PANNICULECTOMY N/A 10/11/2023   Procedure: PANNICULECTOMY;  Surgeon: Waddell Leonce NOVAK, MD;  Location: [facility];  Service: Plastics;  Laterality: N/A;   REMOVAL OF EAR TUBE     TEMPOROMANDIBULAR JOINT ARTHROPLASTY     TONSILLECTOMY AND ADENOIDECTOMY     TUBAL LIGATION       Prior to  Admission medications   Medication Sig Start Date End Date Taking? Authorizing Provider  enoxaparin  (LOVENOX ) 40 MG/0.4ML injection Inject 0.4 mLs (40 mg total) into the skin daily for 6 days. 10/07/23 10/13/23  [name], [name]  albuterol  (VENTOLIN  HFA) 108 (90 Base) MCG/ACT inhaler Inhale 2 puffs into the lungs every 6 (six) hours as needed for wheezing or shortness of breath. 12/08/22   Tanda Bleacher, MD  fluconazole  (DIFLUCAN ) 150 MG tablet Take 1 tablet (150 mg total) by mouth every three (3) days as needed. May repeat in 3 days if symptoms not resolved 10/08/23   Mecum, Erin E, [name]  IBUPROFEN PO Take by mouth.    [provider]  methadone (DOLOPHINE) 10 MG/ML solution Take 20 mg by mouth daily.    [provider]  nitrofurantoin , macrocrystal-monohydrate, (MACROBID ) 100 MG capsule Take 1 capsule (100 mg total) by mouth 2 (two) times daily for 5 days. 10/08/23 10/13/23  Mecum, Erin E, [name]  ondansetron  (ZOFRAN ) 4 MG tablet Take 1 tablet (4 mg total) by mouth every 8 (eight) hours as needed for up to 20 doses for nausea or vomiting. 09/29/23   [name], [name]  oxyCODONE  (ROXICODONE ) 5 MG immediate release tablet Take 1 tablet (5 mg  total) by mouth every 8 (eight) hours as needed for up to 10 doses for severe pain (pain score 7-10). 09/30/23   [name], [name]  phenazopyridine  (PYRIDIUM ) 100 MG tablet Take 1 tablet (100 mg total) by mouth 3 (three) times daily as needed for pain. 10/08/23   Mecum, Erin E, [name]    Allergies: Brassica oleracea, Tylenol  [acetaminophen ], and Codeine    Review of Systems  Musculoskeletal:  Positive for joint swelling.    Updated Vital Signs BP 100/84 (BP Location: Left Arm)   Pulse 72   Temp 98 F (36.7 C)   Resp 18   LMP 10/03/2023 (Exact Date) Comment: negative HCG 10/13/23  SpO2 100%   Physical Exam Vitals and nursing note reviewed.  Constitutional:      General: She is not in acute distress.    Appearance: She is  well-developed.  HENT:     Head: Normocephalic and atraumatic.  Eyes:     Conjunctiva/sclera: Conjunctivae normal.  Cardiovascular:     Rate and Rhythm: Normal rate and regular rhythm.     Heart sounds: No murmur heard. Pulmonary:     Effort: Pulmonary effort is normal. No respiratory distress.     Breath sounds: Normal breath sounds.  Abdominal:     Palpations: Abdomen is soft.     Tenderness: There is no abdominal tenderness.  Musculoskeletal:        General: Swelling present.     Cervical back: Neck supple.     Comments: 1+ bilateral pitting edema, no tenderness, erythema warmth, DP/PT pulses 2+  Skin:    General: Skin is warm and dry.     Capillary Refill: Capillary refill takes less than 2 seconds.  Neurological:     Mental Status: She is alert.  Psychiatric:        Mood and Affect: Mood normal.     (all labs ordered are listed, but only abnormal results are displayed) Labs Reviewed  CBC WITH DIFFERENTIAL/PLATELET - Abnormal; Notable for the following components:      Result Value   RBC 3.39 (*)    Hemoglobin 8.3 (*)    HCT 28.6 (*)    MCH 24.5 (*)    MCHC 29.0 (*)    RDW 16.2 (*)    All other components within normal limits  COMPREHENSIVE METABOLIC PANEL WITH GFR - Abnormal; Notable for the following components:   Glucose, Bld 125 (*)    Calcium 8.6 (*)    All other components within normal limits  D-DIMER, QUANTITATIVE - Abnormal; Notable for the following components:   D-Dimer, Quant 0.51 (*)    All other components within normal limits  RAPID URINE DRUG SCREEN, HOSP PERFORMED - Abnormal; Notable for the following components:   Benzodiazepines POSITIVE (*)    Tetrahydrocannabinol POSITIVE (*)    All other components within normal limits  BRAIN NATRIURETIC PEPTIDE  HCG, SERUM, QUALITATIVE    EKG: None  Radiology: CT Angio Chest PE W and/or Wo Contrast Result Date: 10/13/2023 CLINICAL DATA:  Low to intermediate probability pulmonary embolism, positive  D-dimer EXAM: CT ANGIOGRAPHY CHEST WITH CONTRAST TECHNIQUE: Multidetector CT imaging of the chest was performed using the standard protocol during bolus administration of intravenous contrast. Multiplanar CT image reconstructions and MIPs were obtained to evaluate the vascular anatomy. RADIATION DOSE REDUCTION: This exam was performed according to the departmental dose-optimization program which includes automated exposure control, adjustment of the mA and/or kV according to patient size and/or use of iterative reconstruction  technique. CONTRAST:  75mL OMNIPAQUE  IOHEXOL  350 MG/ML SOLN COMPARISON:  03/24/2023 FINDINGS: Cardiovascular: Adequate opacification of the pulmonary arterial tree. No intraluminal filling defect identified to suggest acute pulmonary embolism. Central pulmonary arteries are of normal caliber. No significant coronary artery calcification. Cardiac size is at the upper limits of normal. No pericardial effusion. No significant atherosclerotic calcification within the thoracic aorta. No aortic aneurysm. Mediastinum/Nodes: No enlarged mediastinal, hilar, or axillary lymph nodes. Thyroid gland, trachea, and esophagus demonstrate no significant findings. Lungs/Pleura: Mild emphysema. No confluent pulmonary infiltrate. No pneumothorax or pleural effusion. Bronchial wall thickening noted in keeping with airway inflammation. No central obstructing lesion. Upper Abdomen: Surgical changes of gastric bypass and cholecystectomy. No acute abnormality Musculoskeletal: No chest wall abnormality. No acute or significant osseous findings. Review of the MIP images confirms the above findings. IMPRESSION: 1. No pulmonary embolism. 2. Mild emphysema.  Airway inflammation. 3. Borderline cardiomegaly. Emphysema (ICD10-J43.9). Electronically Signed   By: Dorethia Molt M.D.   On: 10/13/2023 04:22     Procedures   Medications Ordered in the ED  iohexol  (OMNIPAQUE ) 350 MG/ML injection 75 mL (75 mLs Intravenous  Contrast Given 10/13/23 0350)                                    Medical Decision Making Amount and/or Complexity of Data Reviewed Labs: ordered. Radiology: ordered.  Risk Prescription drug management.   This patient presents to the ED with chief complaint(s) of leg swelling.  The complaint involves an extensive differential diagnosis and also carries with it a high risk of complications and morbidity.   Pertinent past medical history as listed in HPI  The differential diagnosis includes  DVT, lymphedema, CHF, PE Additional history obtained: Additional history obtained from significant other Records reviewed Care Everywhere/External Records  Assessment and management:   Hemodynamically stable, nontoxic.  Patient presented with complaints of bilateral leg swelling that she noticed this morning.  She is 2 days status post pannilectomy.  She states she has taken 2 days worth of Lovenox .  She has no personal history of VTE however she does have a strong family history of DVT/PE.  She denies any chest pain or shortness of breath.  Patient noted to be dropping her sats down to the 80s.  Patient does appear to be intoxicated, as does her partner.  Patient does have a strong history of polysubstance abuse.  Will place on the monitor, O2 and obtain routine labs.  I do not have ultrasound available this evening.  I do have a low suspicion for DVT as patient is presenting with symmetric bilateral lower extremity edema.  However patient is very concerned for DVT therefore I have offered to obtain a dimer, with the understanding that as she is only 2 days postoperative surgery she has a high likelihood of abnormal dimer.  Patient would like to proceed with his blood work regardless.  Lab work notable for hemoglobin of 8.3.  She denies any hematochezia or melena.  Suspect this is likely due to her recent surgery.  Her dimer is very mildly elevated.  She continues to deny any chest pain or shortness of  breath.  However she has been intermittently hypoxic during her stay and is now on 2 L.  She is not on oxygen at baseline.  Her lung sounds are clear.  Otherwise suspect there may have been some ingestion although she denies this.  I have  offered PE study here in the ED given the mildly elevated dimer and hypoxia.  Patient would like to pursue this.  CT PE study negative.  Upon reevaluation patient is no longer hypoxic, she is able to ambulate. She now appears sober.  Will discharge home.  She continues to express concerns for DVTs.  Although her symptoms are bilateral we will obtain outpatient DVT study.  Independent ECG interpretation:  none  Independent labs interpretation:  The following labs were independently interpreted:  Drug screen positive for benzos and cannabis, hCG negative, CBC with leukocytosis, hemoglobin 8.3, CMP without significant abnormality, dimer elevated at 0.51, BNP within normal limits  Independent visualization and interpretation of imaging: I independently visualized the following imaging with scope of interpretation limited to determining acute life threatening conditions related to emergency care:  CT angio PE study negative for PE   Consultations obtained:   None  Disposition:   Patient will be discharged home. The patient has been appropriately medically screened and/or stabilized in the ED. I have low suspicion for any other emergent medical condition which would require further screening, evaluation or treatment in the ED or require inpatient management. At time of discharge the patient is hemodynamically stable and in no acute distress. I have discussed work-up results and diagnosis with patient and answered all questions. Patient is agreeable with discharge plan. We discussed strict return precautions for returning to the emergency department and they verbalized understanding.     Social Determinants of Health:   None This note was dictated with voice  recognition software.  Despite best efforts at proofreading, errors may have occurred which can change the documentation meaning.       Final diagnoses:  Leg swelling    ED Discharge Orders          Ordered    LE Venous       Comments: IMPORTANT PATIENT INSTRUCTIONS: You have been scheduled for an Outpatient Vascular Study at [facility].  If tomorrow is a Saturday, Sunday or holiday, please go to the [facility] Emergency Department Registration Desk at 11 am tomorrow morning and tell them you are there for a vascular study.  If tomorrow is a weekday (Monday-Friday), please go to the [facility] (address [address] [place]) at 8 am and report to the 4th floor registration Zone A.  Inform registration that you are there for a vascular study.   10/13/23 0427               Donnajean Lynwood DEL, [name] 10/13/23 0456    Darra Fonda MATSU, MD 10/13/23 [phone]

## 2023-10-13 NOTE — ED Notes (Signed)
Patient took out her own IV.

## 2023-10-13 NOTE — ED Notes (Signed)
 During walk pt O2 stayed between 90-100. PA was notified

## 2023-10-14 ENCOUNTER — Telehealth: Payer: Self-pay | Admitting: Plastic Surgery

## 2023-10-14 ENCOUNTER — Ambulatory Visit (HOSPITAL_COMMUNITY)
Admission: RE | Admit: 2023-10-14 | Discharge: 2023-10-14 | Disposition: A | Discharge status: Home / Self Care | Source: Ambulatory Visit | Attending: Emergency Medicine | Admitting: Emergency Medicine

## 2023-10-14 ENCOUNTER — Ambulatory Visit (INDEPENDENT_AMBULATORY_CARE_PROVIDER_SITE_OTHER)

## 2023-10-14 VITALS — BP 114/68 | HR 56

## 2023-10-14 DIAGNOSIS — M7989 Other specified soft tissue disorders: Secondary | ICD-10-CM | POA: Insufficient documentation

## 2023-10-14 NOTE — Progress Notes (Signed)
 Patient presents to the office today with bilateral leg swelling.  Negative CT PE and US  LE. Positive for polysubstance in ER evaluation.   Today the patient is A&Ox3 and neurologically intact. Bilateral leg swelling noticed.  Bilateral legs wrapped with Ace. Explained to patient how to make them looser if she experience discomfort.  Leg elevation when resting and walking around when active to prevent blood clots and help with fluid shifts.  All questions answered to patient satisfaction. Continue prophilactic anticoagulation prescribed by Dr. Waddell. Follow up with Dr. Waddell next week. Instructed to go to the ER if any concerns.   Kathy Oxley M. Ayriana Wix, MD [facility]

## 2023-10-14 NOTE — Telephone Encounter (Signed)
 Patient came into the office concerned with poss blood clots per patient and leg swelling, spoke with [facility] and she is assisting if possible

## 2023-10-18 ENCOUNTER — Ambulatory Visit (INDEPENDENT_AMBULATORY_CARE_PROVIDER_SITE_OTHER): Admitting: Plastic Surgery

## 2023-10-18 DIAGNOSIS — Z9889 Other specified postprocedural states: Secondary | ICD-10-CM

## 2023-10-18 NOTE — Progress Notes (Signed)
 [name] returns today for evaluation after panniculectomy 1 week ago.  Patient has had persistent lower extremity swelling since surgery.  She has had vascular ultrasound which was unremarkable as well as a CT scan of the chest which was negative.  She has no pain in her legs today other than the discomfort and swelling.  She is ambulating without difficulty.  She has no difficulty shortness of breath.   On physical examination her legs are swollen bilaterally and equally.  Pulses are normal capillary refill is normal she has no difficulty with standing. Surgical incisions are all clean dry and intact.  She does not have her record but states that her drain output is still well above 30 mL per 24 hours.  She does not have her binder on today.  She states that is at home PA wash and she is wearing it most times.  Status post panniculectomy.  Doing well from the standpoint of surgery.  I am not sure what the etiology of her lower extremity swelling is.  I have asked her to talk with her primary care physician.  She finishes her Lovenox  today.  I have not renewed it.  I have encouraged her to increase her walking to 15 to 20 minutes minimum outside  Keep scheduled appointment and return sooner if her drain output falls below 30 mL for 24 hours.

## 2023-10-19 ENCOUNTER — Ambulatory Visit (INDEPENDENT_AMBULATORY_CARE_PROVIDER_SITE_OTHER)

## 2023-10-19 VITALS — BP 105/65 | HR 62

## 2023-10-19 DIAGNOSIS — Z4889 Encounter for other specified surgical aftercare: Secondary | ICD-10-CM

## 2023-10-19 NOTE — Progress Notes (Signed)
 Ms. Kathy Burns returns today for evaluation after panniculectomy. She saw Dr. Waddell yesterday and had persistent lower extremity swelling since surgery.  She has had vascular ultrasound which was unremarkable as well as a CT scan of the chest which was negative.  She has no pain in her legs today other than the discomfort and swelling.  She is ambulating without difficulty.  She has no difficulty shortness of breath. Today complains about one of the drains came out.    On physical examination her legs are swollen bilaterally and equally.  Pulses are normal capillary refill is normal she has no difficulty with standing. Surgical incisions are all clean dry and intact.  She does not have her record but states that her drain output is still well above 30 mL per 24 hours.  She does not have her binder on today.  She states the right drain has dislodge and came out. I removed the silk stitch and put a opsite on the drain site.  She is not using the Ace bandages for the lower extremities that I recommended her in her previous visit.  Status post panniculectomy.  Doing well from the standpoint of surgery. Right drain removed at home. Lower extremity swelling  per primary care physician and Dr. Waddell.   Keep scheduled appointment with Dr. Waddell and return sooner if her left drain output falls below 30 mL for 24 hours.  Lanai Conlee M. Shaneca Orne, MD [facility]

## 2023-10-21 ENCOUNTER — Encounter: Payer: Self-pay | Admitting: Family

## 2023-10-21 ENCOUNTER — Ambulatory Visit: Admitting: Family

## 2023-10-21 VITALS — BP 117/70 | HR 68 | Temp 98.0°F | Resp 16 | Ht 65.5 in | Wt 162.2 lb

## 2023-10-21 DIAGNOSIS — M7989 Other specified soft tissue disorders: Secondary | ICD-10-CM

## 2023-10-21 NOTE — Progress Notes (Signed)
 Patient ID: Kathy Burns, female    DOB: May 01, 1985  MRN: [id number]  CC: Emergency Department Follow-Up  Subjective: Kathy Burns is a 38 y.o. female who presents for Emergency Department follow-up.   Her concerns today include:  Patient seen on 10/12/2023 - 10/13/2023 (5 hours) at [facility] Emergency Department at [facility] for leg swelling. Labs and imaging collected during the Emergency Department visit were essentially unremarkable. Today patient states bilateral leg swelling persisting/denies red flag symptoms. Patient states leg swelling began after panniculectomy on 10/11/2023. Patient states Plastic Surgery told her they think her leg swelling is unrelated to surgery and told her to follow-up with Primary Care for referral to specialist.   Patient Active Problem List   Diagnosis Date Noted   Well woman exam with routine gynecological exam 05/31/2023   Atypical chest pain 03/11/2023   Palpitations 03/11/2023   Admission for palliative care 10/22/2022   RLS (restless legs syndrome) 11/19/2021   OSA (obstructive sleep apnea) 07/29/2021   COPD, moderate (HCC) 04/03/2021   Gastroesophageal reflux disease without esophagitis 02/04/2021   History of substance abuse (HCC) 02/04/2021   Morbid obesity (HCC) 02/04/2021   Chronic RUQ pain 10/25/2019   Fetal anomaly necessitating delivery 02/27/2019   Bipolar disorder, in partial remission, most recent episode mixed (HCC) 06/15/2016   Chronic hepatitis C without hepatic coma (HCC) 06/15/2016   Heavy smoker 06/15/2016   Herpes simplex antibody positive 06/15/2016   Cellulitis 02/15/2013   Polysubstance abuse (HCC) 02/15/2013     Current Outpatient Medications on File Prior to Visit  Medication Sig Dispense Refill   albuterol  (VENTOLIN  HFA) 108 (90 Base) MCG/ACT inhaler Inhale 2 puffs into the lungs every 6 (six) hours as needed for wheezing or shortness of breath. 8 g 0   IBUPROFEN PO Take by mouth.     methadone  (DOLOPHINE) 10 MG/ML solution Take 20 mg by mouth daily.     ondansetron  (ZOFRAN ) 4 MG tablet Take 1 tablet (4 mg total) by mouth every 8 (eight) hours as needed for up to 20 doses for nausea or vomiting. 20 tablet 0   oxyCODONE  (ROXICODONE ) 5 MG immediate release tablet Take 1 tablet (5 mg total) by mouth every 8 (eight) hours as needed for up to 10 doses for severe pain (pain score 7-10). 10 tablet 0   enoxaparin  (LOVENOX ) 40 MG/0.4ML injection Inject 0.4 mLs (40 mg total) into the skin daily for 6 days. (Patient not taking: Reported on 10/19/2023) 2.4 mL 0   fluconazole  (DIFLUCAN ) 150 MG tablet Take 1 tablet (150 mg total) by mouth every three (3) days as needed. May repeat in 3 days if symptoms not resolved (Patient not taking: Reported on 10/21/2023) 2 tablet 0   phenazopyridine  (PYRIDIUM ) 100 MG tablet Take 1 tablet (100 mg total) by mouth 3 (three) times daily as needed for pain. (Patient not taking: Reported on 10/21/2023) 10 tablet 0   No current facility-administered medications on file prior to visit.    Allergies  Allergen Reactions   Brassica Oleracea Anaphylaxis, Itching, Swelling and Other (See Comments)    Only raw broccoli causes reaction:  Tongue swelling with raw broccoli   Tongue swelling with raw broccoli   Other reaction(s): Other- (not listed) - Allergy  Tongue swelling with raw broccoli   Tylenol  [Acetaminophen ] Palpitations   Codeine Itching    Social History   Socioeconomic History   Marital status: Legally Separated    Spouse name: Not on file  Number of children: 4   Years of education: Not on file   Highest education level: High school graduate  Occupational History   Occupation: Surveyor, mining  Tobacco Use   Smoking status: Every Day    Current packs/day: 0.00    Types: Cigarettes    Last attempt to quit: 2023    Years since quitting: 2.6    Passive exposure: Current   Smokeless tobacco: Never  Vaping Use   Vaping status: Every Day    Substances: Nicotine , Flavoring  Substance and Sexual Activity   Alcohol use: Yes    Comment: a pint or more a day -beer and liquor   Drug use: Not Currently    Types: Marijuana, Cocaine, Heroin    Comment: 02/14/2013 last use, heroine last use 11/21/14   Sexual activity: Yes    Birth control/protection: None  Other Topics Concern   Not on file  Social History Narrative   Not on file   Social Drivers of Health   Financial Resource Strain: Low Risk  (12/08/2022)   Overall Financial Resource Strain (CARDIA)    Difficulty of Paying Living Expenses: Not hard at all  Food Insecurity: No Food Insecurity (12/08/2022)   Hunger Vital Sign    Worried About Running Out of Food in the Last Year: Never true    Ran Out of Food in the Last Year: Never true  Transportation Needs: No Transportation Needs (12/08/2022)   PRAPARE - Administrator, Civil Service (Medical): No    Lack of Transportation (Non-Medical): No  Physical Activity: Sufficiently Active (12/08/2022)   Exercise Vital Sign    Days of Exercise per Week: 5 days    Minutes of Exercise per Session: 30 min  Stress: No Stress Concern Present (12/08/2022)   [employer] of Occupational Health - Occupational Stress Questionnaire    Feeling of Stress : Only a little  Social Connections: Unknown (12/15/2022)   Received from [facility]   Social Network    Social Network: Not on file  Recent Concern: Social Connections - Moderately Isolated (12/08/2022)   Social Connection and Isolation Panel    Frequency of Communication with Friends and Family: More than three times a week    Frequency of Social Gatherings with Friends and Family: More than three times a week    Attends Religious Services: More than 4 times per year    Active Member of [employer] or Organizations: No    Attends Banker Meetings: Never    Marital Status: Separated  Intimate Partner Violence: Unknown (12/15/2022)   Received from [facility]    HITS    Physically Hurt: Not on file    Insult or Talk Down To: Not on file    Threaten Physical Harm: Not on file    Scream or Curse: Not on file    Family History  Problem Relation Age of Onset   Ovarian cancer Mother    Heart disease Maternal Grandmother    Heart disease Maternal Grandfather    Breast cancer Paternal Grandmother    Esophageal cancer Paternal Grandfather    Hypertension Other    Diabetes Other    Cancer Other    Stroke Other    Breast cancer Paternal Aunt     Past Surgical History:  Procedure Laterality Date   CESAREAN SECTION     x 4   CHOLECYSTECTOMY     GASTRIC BYPASS     PANNICULECTOMY N/A 10/11/2023   Procedure: PANNICULECTOMY;  Surgeon: Waddell Leonce NOVAK, MD;  Location: [facility];  Service: Plastics;  Laterality: N/A;   REMOVAL OF EAR TUBE     TEMPOROMANDIBULAR JOINT ARTHROPLASTY     TONSILLECTOMY AND ADENOIDECTOMY     TUBAL LIGATION      ROS: Review of Systems Negative except as stated above  PHYSICAL EXAM: BP 117/70   Pulse 68   Temp 98 F (36.7 C) (Oral)   Resp 16   Ht 5' 5.5 (1.664 m)   Wt 162 lb 3.2 oz (73.6 kg)   LMP 10/03/2023 (Exact Date) Comment: negative HCG 10/13/23  SpO2 94%   BMI 26.58 kg/m   Physical Exam HENT:     Head: Normocephalic and atraumatic.     Nose: Nose normal.     Mouth/Throat:     Mouth: Mucous membranes are moist.     Pharynx: Oropharynx is clear.  Eyes:     Extraocular Movements: Extraocular movements intact.     Conjunctiva/sclera: Conjunctivae normal.     Pupils: Pupils are equal, round, and reactive to light.  Cardiovascular:     Rate and Rhythm: Normal rate and regular rhythm.     Pulses: Normal pulses.     Heart sounds: Normal heart sounds.  Pulmonary:     Effort: Pulmonary effort is normal.     Breath sounds: Normal breath sounds.  Musculoskeletal:        General: Normal range of motion.     Cervical back: Normal range of motion and neck supple.     Right hip: Normal.      Left hip: Normal.     Right upper leg: Normal.     Left upper leg: Normal.     Right knee: Normal.     Left knee: Normal.     Right lower leg: Swelling present. 1+ Edema present.     Left lower leg: Swelling present. 1+ Edema present.     Right ankle: Swelling present.     Left ankle: Swelling present.     Right foot: Swelling present.     Left foot: Swelling present.     Comments: +1 edema bilateral lower extremities.  Neurological:     General: No focal deficit present.     Mental Status: She is alert and oriented to person, place, and time.  Psychiatric:        Mood and Affect: Mood normal.        Behavior: Behavior normal.    ASSESSMENT AND PLAN: 1. Leg swelling (Primary) - Referral to Vascular Surgery for evaluation/management.  - Keep all scheduled appointments with established Plastic Surgery. - Ambulatory referral to Vascular Surgery   Patient was given the opportunity to ask questions.  Patient verbalized understanding of the plan and was able to repeat key elements of the plan. Patient was given clear instructions to go to Emergency Department or return to medical center if symptoms don't improve, worsen, or new problems develop.The patient verbalized understanding.   Orders Placed This Encounter  Procedures   Ambulatory referral to Vascular Surgery    Return for Follow-up as needed.  Greig JINNY Drones, NP

## 2023-10-21 NOTE — Progress Notes (Signed)
 Patient had a out patient procedure then had to go to the emergency room because her oxygen was low

## 2023-10-26 ENCOUNTER — Ambulatory Visit
Admission: RE | Admit: 2023-10-26 | Discharge: 2023-10-26 | Disposition: A | Discharge status: Home / Self Care | Payer: Self-pay | Source: Ambulatory Visit | Attending: Physician Assistant | Admitting: Physician Assistant

## 2023-10-26 VITALS — BP 103/66 | HR 63 | Temp 98.4°F | Resp 17

## 2023-10-26 DIAGNOSIS — N390 Urinary tract infection, site not specified: Secondary | ICD-10-CM

## 2023-10-26 DIAGNOSIS — L089 Local infection of the skin and subcutaneous tissue, unspecified: Secondary | ICD-10-CM | POA: Diagnosis not present

## 2023-10-26 LAB — POCT URINE DIPSTICK
Bilirubin, UA: NEGATIVE
Blood, UA: NEGATIVE
Glucose, UA: NEGATIVE mg/dL
Ketones, POC UA: NEGATIVE mg/dL
Leukocytes, UA: NEGATIVE
Nitrite, UA: POSITIVE — AB
POC PROTEIN,UA: NEGATIVE
Spec Grav, UA: 1.02 (ref 1.010–1.025)
Urobilinogen, UA: 1 U/dL
pH, UA: 6 (ref 5.0–8.0)

## 2023-10-26 MED ORDER — FLUCONAZOLE 150 MG PO TABS
ORAL_TABLET | ORAL | 1 refills | Status: DC
Start: 2023-10-26 — End: 2023-11-20

## 2023-10-26 MED ORDER — CEPHALEXIN 500 MG PO CAPS: 500.0000 mg | ORAL_CAPSULE | Freq: Four times a day (QID) | ORAL | 0 refills | Status: DC

## 2023-10-26 MED ORDER — ONDANSETRON 4 MG PO TBDP: 4.0000 mg | ORAL_TABLET | Freq: Three times a day (TID) | ORAL | 0 refills | Status: AC | PRN

## 2023-10-26 NOTE — ED Provider Notes (Signed)
 [facility]    CSN: [id number] Arrival date & time: 10/26/23  9180      History   Chief Complaint Chief Complaint  Patient presents with   Urinary Frequency    Entered by patient    HPI Kathy Burns is a 38 y.o. female.   Pt complains of burning with urination.  Pt reports she also had recent abdominal surgery and has an area of redness at incision site. No fever, positve anusea  The history is provided by the patient. No language interpreter was used.  Urinary Frequency The current episode started yesterday. The problem occurs constantly. The problem has not changed since onset.   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 02/14/2019   Last Assessment & Plan:    Patient is a 38 yo G4P3003 at [redacted]w[redacted]d with a pregnancy complicated by multiple fetal anomalies, potentially compromising the airway, admitted for extended monitoring and steroids in the setting of BPP 6/10 on day of admission at the MFM office       -  BMZ#1 on 12/15 at 2203, BMZ#2 on 12/16 at 1009   - FHT: category I throughout inpatient admission   - 12/16: BPP 8/8     Patient Active Problem List   Diagnosis Date Noted   Well woman exam with routine gynecological exam 05/31/2023   Atypical chest pain 03/11/2023   Palpitations 03/11/2023   Admission for palliative care 10/22/2022   RLS (restless legs syndrome) 11/19/2021   OSA (obstructive sleep apnea) 07/29/2021   COPD, moderate (HCC) 04/03/2021    Gastroesophageal reflux disease without esophagitis 02/04/2021   History of substance abuse (HCC) 02/04/2021   Morbid obesity (HCC) 02/04/2021   Chronic RUQ pain 10/25/2019   Fetal anomaly necessitating delivery 02/27/2019   Bipolar disorder, in partial remission, most recent episode mixed (HCC) 06/15/2016   Chronic hepatitis C without hepatic coma (HCC) 06/15/2016   Heavy smoker 06/15/2016   Herpes simplex antibody positive 06/15/2016   Cellulitis 02/15/2013   Polysubstance abuse (HCC) 02/15/2013    Past Surgical History:  Procedure Laterality Date   CESAREAN SECTION     x 4   CHOLECYSTECTOMY     GASTRIC BYPASS     PANNICULECTOMY N/A 10/11/2023   Procedure: PANNICULECTOMY;  Surgeon: Waddell Leonce NOVAK, MD;  Location: [facility];  Service: Plastics;  Laterality: N/A;   REMOVAL OF EAR TUBE     TEMPOROMANDIBULAR JOINT ARTHROPLASTY     TONSILLECTOMY AND ADENOIDECTOMY     TUBAL LIGATION      OB History     Gravida  5   Para  4   Term  4   Preterm      AB  1   Living  4      SAB      IAB  1   Ectopic      Multiple  Live Births  4            Home Medications    Prior to Admission medications   Medication Sig Start Date End Date Taking? Authorizing Provider  cephALEXin  (KEFLEX ) 500 MG capsule Take 1 capsule (500 mg total) by mouth 4 (four) times daily. 10/26/23  Yes Dixon Luczak K, [name]  fluconazole  (DIFLUCAN ) 150 MG tablet Take one tablet as needed. Repeat in 3 days if needed 10/26/23  Yes Evadene Wardrip K, [name]  ondansetron  (ZOFRAN -ODT) 4 MG disintegrating tablet Take 1 tablet (4 mg total) by mouth every 8 (eight) hours as needed for nausea or vomiting. 10/26/23  Yes Kaleya Douse K, [name]  albuterol  (VENTOLIN  HFA) 108 (90 Base) MCG/ACT inhaler Inhale 2 puffs into the lungs every 6 (six) hours as needed for wheezing or shortness of breath. 12/08/22   Tanda Bleacher, MD  enoxaparin  (LOVENOX ) 40 MG/0.4ML injection Inject 0.4 mLs (40 mg total)  into the skin daily for 6 days. Patient not taking: Reported on 10/19/2023 10/07/23 10/14/23  [name], [name]  IBUPROFEN PO Take by mouth.    [provider]  methadone (DOLOPHINE) 10 MG/ML solution Take 20 mg by mouth daily.    [provider]  ondansetron  (ZOFRAN ) 4 MG tablet Take 1 tablet (4 mg total) by mouth every 8 (eight) hours as needed for up to 20 doses for nausea or vomiting. 09/29/23   [name], [name]  oxyCODONE  (ROXICODONE ) 5 MG immediate release tablet Take 1 tablet (5 mg total) by mouth every 8 (eight) hours as needed for up to 10 doses for severe pain (pain score 7-10). 09/30/23   [name], [name]  phenazopyridine  (PYRIDIUM ) 100 MG tablet Take 1 tablet (100 mg total) by mouth 3 (three) times daily as needed for pain. Patient not taking: Reported on 10/21/2023 10/08/23   Mecum, Rocky BRAVO, [name]    Family History Family History  Problem Relation Age of Onset   Ovarian cancer Mother    Heart disease Maternal Grandmother    Heart disease Maternal Grandfather    Breast cancer Paternal Grandmother    Esophageal cancer Paternal Grandfather    Hypertension Other    Diabetes Other    Cancer Other    Stroke Other    Breast cancer Paternal Aunt     Social History Social History   Tobacco Use   Smoking status: Every Day    Current packs/day: 0.00    Types: Cigarettes    Last attempt to quit: 2023    Years since quitting: 2.6    Passive exposure: Current   Smokeless tobacco: Never  Vaping Use   Vaping status: Every Day   Substances: Nicotine , Flavoring  Substance Use Topics   Alcohol use: Yes    Comment: a pint or more a day -beer and liquor   Drug use: Not Currently    Types: Marijuana, Cocaine, Heroin    Comment: 02/14/2013 last use, heroine last use 11/21/14     Allergies   Brassica oleracea, Tylenol  [acetaminophen ], and Codeine   Review of Systems Review of Systems  Genitourinary:  Positive for frequency.  All other systems  reviewed and are negative.    Physical Exam Triage Vital Signs ED Triage Vitals  Encounter Vitals Group     BP 10/26/23 0827 103/66     Girls Systolic BP Percentile --      Girls Diastolic BP Percentile --      Boys Systolic BP Percentile --  Boys Diastolic BP Percentile --      Pulse Rate 10/26/23 0827 63     Resp 10/26/23 0827 17     Temp 10/26/23 0827 98.4 F (36.9 C)     Temp src --      SpO2 10/26/23 0827 96 %     Weight --      Height --      Head Circumference --      Peak Flow --      Pain Score 10/26/23 0830 6     Pain Loc --      Pain Education --      Exclude from Growth Chart --    No data found.  Updated Vital Signs BP 103/66 (BP Location: Right Arm)   Pulse 63   Temp 98.4 F (36.9 C)   Resp 17   LMP 10/03/2023 (Exact Date) Comment: negative HCG 10/13/23  SpO2 96%   Visual Acuity Right Eye Distance:   Left Eye Distance:   Bilateral Distance:    Right Eye Near:   Left Eye Near:    Bilateral Near:     Physical Exam Vitals and nursing note reviewed.  Constitutional:      Appearance: She is well-developed.  HENT:     Head: Normocephalic.  Cardiovascular:     Rate and Rhythm: Normal rate.  Pulmonary:     Effort: Pulmonary effort is normal.  Abdominal:     General: Abdomen is flat. There is no distension.     Comments: Slight redness lower right abdominal incision  no drainge  Musculoskeletal:        General: Normal range of motion.  Skin:    General: Skin is warm.  Neurological:     General: No focal deficit present.     Mental Status: She is alert and oriented to person, place, and time.      UC Treatments / Results  Labs (all labs ordered are listed, but only abnormal results are displayed) Labs Reviewed  POCT URINE DIPSTICK - Abnormal; Notable for the following components:      Result Value   Nitrite, UA Positive (*)    All other components within normal limits    EKG   Radiology No results  found.  Procedures Procedures (including critical care time)  Medications Ordered in UC Medications - No data to display  Initial Impression / Assessment and Plan / UC Course  I have reviewed the triage vital signs and the nursing notes.  Pertinent labs & imaging results that were available during my care of the patient were reviewed by me and considered in my medical decision making (see chart for details).     An After Visit Summary was printed and given to the patient.     Final Clinical Impressions(s) / UC Diagnoses   Final diagnoses:  Urinary tract infection without hematuria, site unspecified  Skin infection   Discharge Instructions   None    ED Prescriptions     Medication Sig Dispense Auth. Provider   cephALEXin  (KEFLEX ) 500 MG capsule Take 1 capsule (500 mg total) by mouth 4 (four) times daily. 28 capsule Farrell Broerman K, [name]   ondansetron  (ZOFRAN -ODT) 4 MG disintegrating tablet Take 1 tablet (4 mg total) by mouth every 8 (eight) hours as needed for nausea or vomiting. 20 tablet Kinzleigh Kandler K, [name]   fluconazole  (DIFLUCAN ) 150 MG tablet Take one tablet as needed. Repeat in 3 days if needed 2 tablet Adolf Ormiston K,  [name]      PDMP not reviewed this encounter.   Flint Sonny POUR, [name] 10/26/23 [phone]

## 2023-10-26 NOTE — ED Triage Notes (Addendum)
 Pt c/o recurrent uti's. Presents today due to cold chills and nausea for 1 day and burning while urinating last night.   Pt also recently had panniculectomy surgery (8/11)

## 2023-10-29 ENCOUNTER — Encounter: Admitting: Student

## 2023-11-02 ENCOUNTER — Ambulatory Visit (INDEPENDENT_AMBULATORY_CARE_PROVIDER_SITE_OTHER): Admitting: Student

## 2023-11-02 VITALS — BP 95/57 | HR 57 | Temp 98.1°F

## 2023-11-02 DIAGNOSIS — Z9889 Other specified postprocedural states: Secondary | ICD-10-CM

## 2023-11-02 NOTE — Progress Notes (Unsigned)
 Patient underwent panniculectomy with Dr. Waddell on 10/11/2023.  Patient is about 3 weeks postop.  She presents to the clinic today for postoperative follow-up.  Patient was last seen in the clinic on 10/19/2023.  At this visit, it was noted that patient had persistent lower extremity swelling since surgery and had vascular ultrasound which was unremarkable as well as a CT scan of the chest which was negative.  On exam, the legs were swollen bilaterally.  Incisions were clean dry and intact.  Patient reported that her drain output was still well above 30 cc over 24 hours.  Patient also states that her right drain became dislodged and came out.  Today,

## 2023-11-04 ENCOUNTER — Encounter: Admitting: Student

## 2023-11-11 ENCOUNTER — Encounter: Payer: Self-pay | Admitting: Student

## 2023-11-11 ENCOUNTER — Ambulatory Visit: Admitting: Student

## 2023-11-11 VITALS — BP 110/68 | HR 51 | Ht 65.5 in | Wt 149.0 lb

## 2023-11-11 DIAGNOSIS — Z9889 Other specified postprocedural states: Secondary | ICD-10-CM

## 2023-11-11 NOTE — Progress Notes (Signed)
 Patient underwent panniculectomy with Dr. Waddell on 10/11/2023.  Patient is a little over 4 weeks postop.  She presents to the clinic today for postoperative follow-up.     Patient was last seen in the clinic on 11/02/2023.  At this visit, patient had still been putting out high outputs from her drain, around 65 cc over 24 hours.  On exam, her abdomen was soft and nontender.  There were no obvious fluid collections on exam.  Incision was intact and healing well.  JP drain was in place and functioning with serous drainage in the bulb.  Drain was left in place.  Today, patient is overall doing well.  She states that she is having some discomfort at the drain site.  She states that the drain has come out a little bit, but she has pushed it back in.  She reports that the drain output has been about 40 cc/day for the past few days.  She denies any fevers or chills.  She denies any other issues or concerns at this time.  Chaperone present on exam.  On exam, patient is sitting upright in no acute distress.  Abdomen is overall soft and nontender.  There is no overlying erythema.  No obvious fluid collections on exam.  Incision appears to be healing well.  Left JP drain is functioning with minimal serous drainage in the bulb.  Left JP drain site does appear to be irritated.  Suture no longer appears to be tied to the JP drain.  There are no signs of infection on exam.  We did discuss that given patient is at 4 weeks postop, she may be at increased risk of infection from the drain itself.  I discussed patient's drain output and visit with Dr. Waddell, and he was okay with proceeding with removing the drain today.  Drain and drain stitch were removed without any difficulty.  Patient tolerated well.  I discussed with the patient the importance of wearing compression at all times.  I discussed with her to avoid strenuous activities.  Patient expressed understanding.  I discussed with the patient like her to apply gauze  dressing over her drain site daily.  Discussed with her that she should change this if it becomes saturated.  Patient expressed understanding.  I discussed with the patient that she may start applying scar creams and massaging her incision.  She expressed understanding.  Patient to follow back up in 1 to 2 weeks.  I instructed her to call if she has any questions or concerns about anything.

## 2023-11-17 ENCOUNTER — Ambulatory Visit (INDEPENDENT_AMBULATORY_CARE_PROVIDER_SITE_OTHER): Admitting: Plastic Surgery

## 2023-11-17 ENCOUNTER — Encounter: Payer: Self-pay | Admitting: Plastic Surgery

## 2023-11-17 VITALS — BP 114/73 | HR 80

## 2023-11-17 DIAGNOSIS — Z9889 Other specified postprocedural states: Secondary | ICD-10-CM

## 2023-11-17 DIAGNOSIS — S301XXA Contusion of abdominal wall, initial encounter: Secondary | ICD-10-CM

## 2023-11-17 NOTE — Progress Notes (Signed)
 Kathy Burns returns today approximately 5 weeks postop from a panniculectomy.  Her postoperative course has been very bumpy with multiple concerns of leg swelling and possible DVT all of which were negative.  She had a drain which remained with high output until recently when the drain fell out.  She returns today with an obvious fluid wave and accumulation of seroma fluid.  She does admit that she has been smoking since surgery.  I attempted aspiration of the fluid and got out 25 cc of a clear serous appearing fluid consistent with a seroma.  There is still a significant amount of fluid left.  I have instructed her to continue wearing her compressive garment.  Will place a request to IR for drain placement.  Follow-up next week

## 2023-11-18 ENCOUNTER — Other Ambulatory Visit: Payer: Self-pay | Admitting: Student

## 2023-11-18 ENCOUNTER — Other Ambulatory Visit (HOSPITAL_COMMUNITY): Payer: Self-pay | Admitting: Plastic Surgery

## 2023-11-18 DIAGNOSIS — S301XXA Contusion of abdominal wall, initial encounter: Secondary | ICD-10-CM

## 2023-11-19 ENCOUNTER — Ambulatory Visit (HOSPITAL_COMMUNITY)
Admission: RE | Admit: 2023-11-19 | Discharge: 2023-11-19 | Disposition: A | Discharge status: Home / Self Care | Source: Ambulatory Visit | Attending: Plastic Surgery | Admitting: Plastic Surgery

## 2023-11-19 ENCOUNTER — Encounter (HOSPITAL_COMMUNITY): Payer: Self-pay

## 2023-11-19 DIAGNOSIS — S301XXA Contusion of abdominal wall, initial encounter: Secondary | ICD-10-CM | POA: Insufficient documentation

## 2023-11-19 DIAGNOSIS — Y831 Surgical operation with implant of artificial internal device as the cause of abnormal reaction of the patient, or of later complication, without mention of misadventure at the time of the procedure: Secondary | ICD-10-CM | POA: Diagnosis not present

## 2023-11-19 DIAGNOSIS — Z635 Disruption of family by separation and divorce: Secondary | ICD-10-CM | POA: Diagnosis not present

## 2023-11-19 DIAGNOSIS — F17290 Nicotine dependence, other tobacco product, uncomplicated: Secondary | ICD-10-CM | POA: Insufficient documentation

## 2023-11-19 DIAGNOSIS — F17210 Nicotine dependence, cigarettes, uncomplicated: Secondary | ICD-10-CM | POA: Insufficient documentation

## 2023-11-19 DIAGNOSIS — Z9049 Acquired absence of other specified parts of digestive tract: Secondary | ICD-10-CM | POA: Diagnosis not present

## 2023-11-19 HISTORY — PX: IR GUIDED DRAIN W CATHETER PLACEMENT: IMG719

## 2023-11-19 HISTORY — PX: IR IMAGE GUIDED DRAINAGE BY PERCUTANEOUS CATHETER: IMG5465

## 2023-11-19 LAB — PREGNANCY, URINE: Preg Test, Ur: NEGATIVE

## 2023-11-19 MED ORDER — MIDAZOLAM HCL 2 MG/2ML IJ SOLN
INTRAMUSCULAR | Status: AC
  Filled 2023-11-19: qty 2

## 2023-11-19 MED ORDER — VERAPAMIL HCL 2.5 MG/ML IV SOLN
INTRAVENOUS | Status: AC
  Filled 2023-11-19: qty 2

## 2023-11-19 MED ORDER — LIDOCAINE HCL 1 % IJ SOLN
INTRAMUSCULAR | Status: AC
  Filled 2023-11-19: qty 20

## 2023-11-19 MED ORDER — FENTANYL CITRATE (PF) 100 MCG/2ML IJ SOLN
INTRAMUSCULAR | Status: AC
  Filled 2023-11-19: qty 4

## 2023-11-19 MED ORDER — FENTANYL CITRATE (PF) 100 MCG/2ML IJ SOLN
INTRAMUSCULAR | Status: AC
  Filled 2023-11-19: qty 2

## 2023-11-19 MED ORDER — LIDOCAINE-EPINEPHRINE 1 %-1:100000 IJ SOLN
20.0000 mL | Freq: Once | INTRAMUSCULAR | Status: AC
  Administered 2023-11-19: 10 mL via INTRADERMAL

## 2023-11-19 MED ORDER — MIDAZOLAM HCL 2 MG/2ML IJ SOLN
INTRAMUSCULAR | Status: AC | PRN
  Administered 2023-11-19 (×3): 1 mg via INTRAVENOUS

## 2023-11-19 MED ORDER — NITROGLYCERIN 1 MG/10 ML FOR IR/CATH LAB
INTRA_ARTERIAL | Status: AC
  Filled 2023-11-19: qty 10

## 2023-11-19 MED ORDER — FENTANYL CITRATE (PF) 100 MCG/2ML IJ SOLN
INTRAMUSCULAR | Status: AC | PRN
  Administered 2023-11-19: 50 ug via INTRAVENOUS

## 2023-11-19 MED ORDER — MIDAZOLAM HCL 2 MG/2ML IJ SOLN
INTRAMUSCULAR | Status: AC
  Filled 2023-11-19: qty 4

## 2023-11-19 MED ORDER — LIDOCAINE-EPINEPHRINE 1 %-1:100000 IJ SOLN
INTRAMUSCULAR | Status: AC
  Filled 2023-11-19: qty 1

## 2023-11-19 MED ORDER — SODIUM CHLORIDE 0.9 % IV SOLN: INTRAVENOUS | Status: DC

## 2023-11-19 MED ORDER — HYDROMORPHONE HCL 1 MG/ML IJ SOLN
INTRAMUSCULAR | Status: AC
  Filled 2023-11-19: qty 1

## 2023-11-19 NOTE — H&P (Signed)
 Chief Complaint: Patient was seen in consultation today for seroma post panniculectomy--- need for drain placement at the request of Kathy Burns  Referring Physician(s): Kathy Burns  Supervising Physician: Kathy Burns  Patient Status: [facility] - Out-pt  History of Present Illness: Kathy Burns is a 38 y.o. female   FULL Code status per pt  Pt underwent panniculectomy 10/11/23 Did well 2 drains placed in OR One drain fell out after one week One drain remained for 4 weeks and removed per MD  Has noticed continual larger bulging in RLQ  Dr Kathy Burns evaluated pt in office 2 days ago Aspirated serous fluid from area-- consistent with seroma Has asked IR to evaluate and replace drain(s)  Pt is scheduled for this today Will follow with Dr Burns     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 02/14/2019   Last Assessment & Plan:    Patient is a 38 yo G4P3003 at [redacted]w[redacted]d with a pregnancy complicated by multiple fetal anomalies, potentially compromising the airway, admitted for extended monitoring and steroids in the setting of BPP 6/10 on day of admission at the MFM office       -  BMZ#1 on 12/15 at 2203, BMZ#2 on 12/16 at 1009   - FHT: category I throughout inpatient admission   - 12/16: BPP 8/8     Past Surgical History:  Procedure Laterality Date   CESAREAN SECTION     x 4   CHOLECYSTECTOMY     GASTRIC BYPASS     PANNICULECTOMY N/A 10/11/2023   Procedure:  PANNICULECTOMY;  Surgeon: Burns Kathy NOVAK, MD;  Location: [facility];  Service: Plastics;  Laterality: N/A;   REMOVAL OF EAR TUBE     TEMPOROMANDIBULAR JOINT ARTHROPLASTY     TONSILLECTOMY AND ADENOIDECTOMY     TUBAL LIGATION      Allergies: Brassica oleracea, Tylenol  [acetaminophen ], and Codeine  Medications: Prior to Admission medications   Medication Sig Start Date End Date Taking? Authorizing Provider  albuterol  (VENTOLIN  HFA) 108 (90 Base) MCG/ACT inhaler Inhale 2 puffs into the lungs every 6 (six) hours as needed for wheezing or shortness of breath. 12/08/22   Tanda Bleacher, MD  cephALEXin  (KEFLEX ) 500 MG capsule Take 1 capsule (500 mg total) by mouth 4 (four) times daily. 10/26/23   Sofia, [name], [name]  fluconazole  (DIFLUCAN ) 150 MG tablet Take one tablet as needed. Repeat in 3 days if needed 10/26/23   Sofia, [name], [name]  IBUPROFEN PO Take by mouth.    [provider]  methadone (DOLOPHINE) 10 MG/ML solution Take 20 mg by mouth daily.    [provider]  ondansetron  (ZOFRAN -ODT) 4 MG disintegrating tablet Take 1 tablet (4 mg total) by mouth every 8 (eight) hours as needed for nausea or vomiting. 10/26/23   Sofia, [name], [name]  oxyCODONE  (ROXICODONE ) 5 MG immediate release tablet Take 1  tablet (5 mg total) by mouth every 8 (eight) hours as needed for up to 10 doses for severe pain (pain score 7-10). 09/30/23   Kathy Estefana BRAVO, [name]     Family History  Problem Relation Age of Onset   Ovarian cancer Mother    Heart disease Maternal Grandmother    Heart disease Maternal Grandfather    Breast cancer Paternal Grandmother    Esophageal cancer Paternal Grandfather    Hypertension Other    Diabetes Other    Cancer Other    Stroke Other    Breast cancer Paternal Aunt     Social History   Socioeconomic History   Marital status: Legally Separated    Spouse name: Not on file   Number of children: 4   Years of education: Not on file    Highest education level: High school graduate  Occupational History   Occupation: Surveyor, mining  Tobacco Use   Smoking status: Every Day    Current packs/day: 0.00    Types: Cigarettes    Last attempt to quit: 2023    Years since quitting: 2.7    Passive exposure: Current   Smokeless tobacco: Never  Vaping Use   Vaping status: Every Day   Substances: Nicotine , Flavoring  Substance and Sexual Activity   Alcohol use: Yes    Comment: a pint or more a day -beer and liquor   Drug use: Not Currently    Types: Marijuana, Cocaine, Heroin    Comment: 02/14/2013 last use, heroine last use 11/21/14   Sexual activity: Yes    Birth control/protection: None  Other Topics Concern   Not on file  Social History Narrative   Not on file   Social Drivers of Health   Financial Resource Strain: Low Risk  (12/08/2022)   Overall Financial Resource Strain (CARDIA)    Difficulty of Paying Living Expenses: Not hard at all  Food Insecurity: No Food Insecurity (12/08/2022)   Hunger Vital Sign    Worried About Running Out of Food in the Last Year: Never true    Ran Out of Food in the Last Year: Never true  Transportation Needs: No Transportation Needs (12/08/2022)   PRAPARE - Administrator, Civil Service (Medical): No    Lack of Transportation (Non-Medical): No  Physical Activity: Sufficiently Active (12/08/2022)   Exercise Vital Sign    Days of Exercise per Week: 5 days    Minutes of Exercise per Session: 30 min  Stress: No Stress Concern Present (12/08/2022)   [employer] of Occupational Health - Occupational Stress Questionnaire    Feeling of Stress : Only a little  Social Connections: Unknown (12/15/2022)   Received from [facility]   Social Network    Social Network: Not on file  Recent Concern: Social Connections - Moderately Isolated (12/08/2022)   Social Connection and Isolation Panel    Frequency of Communication with Friends and Family: More than three times a week     Frequency of Social Gatherings with Friends and Family: More than three times a week    Attends Religious Services: More than 4 times per year    Active Member of [employer] or Organizations: No    Attends Banker Meetings: Never    Marital Status: Separated    Review of Systems: A 12 point ROS discussed and pertinent positives are indicated in the HPI above.  All other systems are negative.  Review of Systems  Constitutional:  Negative for activity  change, fatigue and fever.  Respiratory:  Negative for cough and shortness of breath.   Cardiovascular:  Negative for chest pain.  Gastrointestinal:  Positive for abdominal pain. Negative for nausea and vomiting.  Musculoskeletal:  Negative for back pain.  Neurological:  Negative for weakness.  Psychiatric/Behavioral:  Negative for behavioral problems and confusion.     Vital Signs: BP 99/66   Pulse 63   Temp 98.2 F (36.8 C) (Oral)   Resp 18   Ht 5' 5 (1.651 m)   Wt 146 lb (66.2 kg)   SpO2 96%   BMI 24.30 kg/m     Physical Exam Vitals reviewed.  HENT:     Mouth/Throat:     Mouth: Mucous membranes are moist.  Cardiovascular:     Rate and Rhythm: Normal rate and regular rhythm.     Heart sounds: Normal heart sounds.  Pulmonary:     Effort: Pulmonary effort is normal.     Breath sounds: Normal breath sounds. No wheezing.  Abdominal:     General: There is distension.     Palpations: Abdomen is soft.  Musculoskeletal:        General: Normal range of motion.  Skin:    General: Skin is warm.  Neurological:     Mental Status: She is alert and oriented to person, place, and time.  Psychiatric:        Behavior: Behavior normal.     Imaging: No results found.  Labs:  CBC: Recent Labs    12/14/22 1038 05/13/23 1144 10/13/23 0121  WBC 6.6 6.6  6.6 7.7  HGB 12.5 12.4  12.4 8.3*  HCT 40.7 38.3  38.3 28.6*  PLT 283 323.0  323.0 277    COAGS: Recent Labs    05/13/23 1144  INR 1.1*     BMP: Recent Labs    12/14/22 1038 05/13/23 1144 09/10/23 1015 10/13/23 0121  NA 141 141 141 135  Burns 4.1 4.1 4.4 4.0  CL 105 106 103 104  CO2 22 27 20 24   GLUCOSE 86 91 77 125*  BUN 13 9 10 20   CALCIUM 9.2 8.9 9.3 8.6*  CREATININE 0.69 0.65 0.75 0.80  GFRNONAA  --   --   --  >60    LIVER FUNCTION TESTS: Recent Labs    12/14/22 1038 05/13/23 1144 10/13/23 0121  BILITOT 0.5 0.7 0.5  AST 22 19 25   ALT 24 19 22   ALKPHOS 96 67 43  PROT 7.3 6.8 6.5  ALBUMIN 4.3 4.0 3.5    TUMOR MARKERS: No results for input(s): AFPTM, CEA, CA199, CHROMGRNA in the last 8760 hours.  Assessment and Plan:  Scheduled today for seroma drain placement Risks and benefits discussed with the patient including bleeding, infection, damage to adjacent structures,fistula connection, and sepsis.  All of the patient's questions were answered, patient is agreeable to proceed. Consent signed and in chart.  Thank you for this interesting consult.  I greatly enjoyed meeting KHRISTINA JANOTA and look forward to participating in their care.  A copy of this report was sent to the requesting provider on this date.  Electronically Signed: Sharlet DELENA Candle, [name] 11/19/2023, 7:06 AM   I spent a total of  30 Minutes   in face to face in clinical consultation, greater than 50% of which was counseling/coordinating care for seroma drain (s) placement

## 2023-11-20 ENCOUNTER — Encounter: Payer: Self-pay | Admitting: *Deleted

## 2023-11-20 ENCOUNTER — Ambulatory Visit
Admission: RE | Admit: 2023-11-20 | Discharge: 2023-11-20 | Disposition: A | Discharge status: Home / Self Care | Source: Ambulatory Visit

## 2023-11-20 ENCOUNTER — Ambulatory Visit
Admission: RE | Admit: 2023-11-20 | Discharge: 2023-11-20 | Disposition: A | Discharge status: Home / Self Care | Payer: Self-pay | Source: Ambulatory Visit

## 2023-11-20 ENCOUNTER — Other Ambulatory Visit: Payer: Self-pay

## 2023-11-20 DIAGNOSIS — R300 Dysuria: Secondary | ICD-10-CM | POA: Diagnosis not present

## 2023-11-20 DIAGNOSIS — N898 Other specified noninflammatory disorders of vagina: Secondary | ICD-10-CM

## 2023-11-20 LAB — POCT URINE DIPSTICK
Bilirubin, UA: NEGATIVE
Blood, UA: NEGATIVE
Glucose, UA: NEGATIVE mg/dL
Ketones, POC UA: NEGATIVE mg/dL
Leukocytes, UA: NEGATIVE
Nitrite, UA: NEGATIVE
POC PROTEIN,UA: NEGATIVE
Spec Grav, UA: >=1.03 — AB (ref 1.010–1.025)
Urobilinogen, UA: 1 U/dL
pH, UA: 5.5 (ref 5.0–8.0)

## 2023-11-20 MED ORDER — FLUCONAZOLE 150 MG PO TABS
150.0000 mg | ORAL_TABLET | Freq: Once | ORAL | 0 refills | Status: DC | PRN
Start: 2023-11-20 — End: 2024-01-25

## 2023-11-20 NOTE — Discharge Instructions (Signed)
 No infection in your urine today You can start the macrobid  once daily for UTI prevention   For your vaginal itching I have sent fluconazole  to treat for possible yeast

## 2023-11-20 NOTE — ED Triage Notes (Signed)
 C/o UTI Sx. Recently treated with cipro  for UTI. She went to a urologist and has been prescribed a daily antibiotic for frequent UTIs. States it took time for it to be approved by her insurance and she hasn't started taking it yet. She is having some Sx of UTI (burning and frequency) and wants to be checked before she starts taking the daily antibiotic (macrobid ).

## 2023-11-20 NOTE — ED Provider Notes (Signed)
 [facility]    CSN: [id number] Arrival date & time: 11/20/23  1019      History   Chief Complaint Chief Complaint  Patient presents with   Dysuria    HPI Kathy Burns is a 38 y.o. female.  Here with dysuria Also vaginal itching, would like yeast treatment. Declines any STD testing today  History of recurrent UTI Seen 3 weeks ago on 8/26 for UTI, UA nitrate positive. She was treated with keflex . Unfortunately no urine culture was ordered.  Was also seen 8/8, treated for UTI with macrobid , but culture resulted no growth. Similar episode in July - treated with Augmentin , no growth on culture.  Saw urology -- prescribed macrobid  once daily for preventative. She has not started yet, wanted to make sure she didn't have acute infection currently.   Reports drank a lot of alcohol last night She is drinking sweet tea in clinic today   JP drains in place post panniculectomy Follow with plastic surg, next visit in 5 days  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 02/14/2019   Last Assessment & Plan:    Patient is a 38 yo G4P3003 at [redacted]w[redacted]d with a pregnancy complicated by multiple fetal anomalies, potentially compromising the airway, admitted for extended monitoring and steroids in the setting of BPP 6/10 on day of admission at the MFM office       -  BMZ#1 on 12/15 at 2203, BMZ#2 on 12/16 at 1009   - FHT: category I throughout inpatient admission   - 12/16: BPP 8/8      Patient Active Problem List   Diagnosis Date Noted   Well woman exam with routine gynecological exam 05/31/2023   Atypical chest pain 03/11/2023   Palpitations 03/11/2023   Admission for palliative care 10/22/2022   RLS (restless legs syndrome) 11/19/2021   OSA (obstructive sleep apnea) 07/29/2021   COPD, moderate (HCC) 04/03/2021   Gastroesophageal reflux disease without esophagitis 02/04/2021   History of substance abuse (HCC) 02/04/2021   Morbid obesity (HCC) 02/04/2021   Chronic RUQ pain 10/25/2019   Fetal anomaly necessitating delivery 02/27/2019   Bipolar disorder, in partial remission, most recent episode mixed (HCC) 06/15/2016   Chronic hepatitis C without hepatic coma (HCC) 06/15/2016   Heavy smoker 06/15/2016   Herpes simplex antibody positive 06/15/2016   Cellulitis 02/15/2013   Polysubstance abuse (HCC) 02/15/2013    Past Surgical History:  Procedure Laterality Date   CESAREAN SECTION     x 4   CHOLECYSTECTOMY     GASTRIC BYPASS     IR GUIDED DRAIN W CATHETER PLACEMENT  11/19/2023   PANNICULECTOMY N/A 10/11/2023   Procedure: PANNICULECTOMY;  Surgeon: Waddell Leonce NOVAK, MD;  Location: [facility];  Service: Plastics;  Laterality: N/A;   REMOVAL OF EAR TUBE     TEMPOROMANDIBULAR JOINT ARTHROPLASTY     TONSILLECTOMY AND ADENOIDECTOMY  TUBAL LIGATION      OB History     Gravida  5   Para  4   Term  4   Preterm      AB  1   Living  4      SAB      IAB  1   Ectopic      Multiple      Live Births  4            Home Medications    Prior to Admission medications   Medication Sig Start Date End Date Taking? Authorizing Provider  albuterol  (VENTOLIN  HFA) 108 (90 Base) MCG/ACT inhaler Inhale 2 puffs into the lungs every 6 (six) hours as needed for wheezing or shortness of breath. 12/08/22  Yes Tanda Bleacher, MD  fluconazole  (DIFLUCAN ) 150 MG tablet Take 1 tablet (150 mg total) by mouth once as needed for up to 2 doses  (take one pill on day 1, and the second pill 3 days later). 11/20/23  Yes Dessie Delcarlo, Asberry, [name]  nitrofurantoin  (MACRODANTIN ) 100 MG capsule Take 100 mg by mouth daily. 11/16/23  Yes [provider]  ondansetron  (ZOFRAN -ODT) 4 MG disintegrating tablet Take 1 tablet (4 mg total) by mouth every 8 (eight) hours as needed for nausea or vomiting. 10/26/23  Yes Sofia, [name], [name]  IBUPROFEN PO Take by mouth.    [provider]    Family History Family History  Problem Relation Age of Onset   Ovarian cancer Mother    Heart disease Maternal Grandmother    Heart disease Maternal Grandfather    Breast cancer Paternal Grandmother    Esophageal cancer Paternal Grandfather    Hypertension Other    Diabetes Other    Cancer Other    Stroke Other    Breast cancer Paternal Aunt     Social History Social History   Tobacco Use   Smoking status: Every Day    Current packs/day: 0.00    Types: Cigarettes    Last attempt to quit: 2023    Years since quitting: 2.7    Passive exposure: Current   Smokeless tobacco: Never  Vaping Use   Vaping status: Every Day   Substances: Nicotine , Flavoring  Substance Use Topics   Alcohol use: Yes    Comment: a pint or more a day -beer and liquor   Drug use: Not Currently    Types: Marijuana, Cocaine, Heroin    Comment: 02/14/2013 last use, heroine last use 11/21/14     Allergies   Brassica oleracea, Tylenol  [acetaminophen ], and Codeine   Review of Systems Review of Systems  As per HPI  Physical Exam Triage Vital Signs ED Triage Vitals  Encounter Vitals Group     BP      Girls Systolic BP Percentile      Girls Diastolic BP Percentile      Boys Systolic BP Percentile      Boys Diastolic BP Percentile      Pulse      Resp      Temp      Temp src      SpO2      Weight      Height      Head Circumference      Peak Flow      Pain Score      Pain Loc      Pain Education      Exclude from Growth Chart    No  data  found.  Updated Vital Signs BP 117/77 (BP Location: Left Arm)   Pulse 61   Temp 98.5 F (36.9 C) (Oral)   Resp 14   SpO2 97%   Physical Exam Vitals and nursing note reviewed.  Constitutional:      General: She is not in acute distress. Cardiovascular:     Rate and Rhythm: Normal rate and regular rhythm.     Pulses: Normal pulses.     Heart sounds: Normal heart sounds.  Pulmonary:     Effort: Pulmonary effort is normal.     Breath sounds: Normal breath sounds.  Abdominal:     Palpations: Abdomen is soft.     Tenderness: There is no abdominal tenderness.     Comments: JP drain in place on right. Red tinged fluid. Compression garment on. No suprapubic tenderness, no CVA tenderness   Skin:    General: Skin is warm and dry.  Neurological:     Mental Status: She is alert and oriented to person, place, and time.  Psychiatric:        Behavior: Behavior is hyperactive.    UC Treatments / Results  Labs (all labs ordered are listed, but only abnormal results are displayed) Labs Reviewed  POCT URINE DIPSTICK - Abnormal; Notable for the following components:      Result Value   Clarity, UA cloudy (*)    Spec Grav, UA >=1.030 (*)    All other components within normal limits    EKG   Radiology IR Guided Drain W Catheter Placement Result Date: 11/19/2023 INDICATION: Abdominal wall seroma EXAM: Ultrasound-guided percutaneous catheter drainage MEDICATIONS: The patient is currently admitted to the hospital and receiving intravenous antibiotics. The antibiotics were administered within an appropriate time frame prior to the initiation of the procedure. ANESTHESIA/SEDATION: Moderate (conscious) sedation was employed during this procedure. A total of Versed  3 mg and Fentanyl  50 mcg was administered intravenously by the radiology nurse. Total intra-service moderate Sedation Time: 22 minutes. The patient's level of consciousness and vital signs were monitored continuously by radiology  nursing throughout the procedure under my direct supervision. COMPLICATIONS: None immediate. PROCEDURE: Informed written consent was obtained from the patient after a thorough discussion of the procedural risks, benefits and alternatives. All questions were addressed. Maximal Sterile Barrier Technique was utilized including caps, mask, sterile gowns, sterile gloves, sterile drape, hand hygiene and skin antiseptic. A timeout was performed prior to the initiation of the procedure. The right lower superficial pelvic wall was prepped and draped in usual sterile fashion. Local anesthetic was obtained with 1% lidocaine . Using Seldinger technique, a 10 French percutaneous drainage catheter with additional sideholes was placed into the superficial pelvic wall. This yielded a initially 200 cc of serous fluid. The tube was then placed to bulb suction drainage. The drain was secured in place using braided suture material and an occlusive dressing was applied. IMPRESSION: 1. Percutaneous catheter drainage of the superficial anterior pelvic wall. 2. Per instruction, the patient will follow-up with Dr. Waddell for further drain management. Electronically Signed   By: Maude Naegeli M.D.   On: 11/19/2023 09:10    Procedures Procedures (including critical care time)  Medications Ordered in UC Medications - No data to display  Initial Impression / Assessment and Plan / UC Course  I have reviewed the triage vital signs and the nursing notes.  Pertinent labs & imaging results that were available during my care of the patient were reviewed by me and considered in my medical  decision making (see chart for details).  No infection seen on UA today Had negative UPT yesterday with plastic surgery Elevated spec grav. Alcohol last night and sweet tea this morning. Advised importance of drinking water She will start her UTI prophylaxis from urology.   Declines vaginal swab, would like treatment for yeast. Fluconazole   sent  Final Clinical Impressions(s) / UC Diagnoses   Final diagnoses:  Dysuria  Vaginal itching     Discharge Instructions      No infection in your urine today You can start the macrobid  once daily for UTI prevention   For your vaginal itching I have sent fluconazole  to treat for possible yeast      ED Prescriptions     Medication Sig Dispense Auth. Provider   fluconazole  (DIFLUCAN ) 150 MG tablet Take 1 tablet (150 mg total) by mouth once as needed for up to 2 doses (take one pill on day 1, and the second pill 3 days later). 2 tablet Jemari Hallum, Asberry, [name]      PDMP not reviewed this encounter.   Jeryl Asberry, [name] 11/20/23 1147

## 2023-11-25 ENCOUNTER — Ambulatory Visit: Admitting: Surgical

## 2023-11-25 VITALS — BP 91/55 | HR 67

## 2023-11-25 DIAGNOSIS — Z9889 Other specified postprocedural states: Secondary | ICD-10-CM

## 2023-11-25 NOTE — Progress Notes (Signed)
 Patient is a 38 year old female here for follow-up after panniculectomy.  Patient underwent panniculectomy with Dr. Waddell on 10/11/2023.  She did have a fluid collection that developed after removal of JP drains.  Underwent IR guided drain placement on 11/19/2023.  Initial yield was 200 cc of serous fluid.  Patient reports that she is getting out about 100 cc per 24 hours at this time.  She reports she is not having any infectious symptoms.  Pain is much improved.  She is not having any fevers or chills or any other concerns today.  She does report that she has had some dizziness while at work, she works as a Production designer, theatre/television/film at [employer].  She reports she is not having any dizziness now.  She does not report any cardiac or pulmonary symptoms.   She does report that she is currently on an antibiotic for a UTI.  Chaperone present on exam BP (!) 91/55 (BP Location: Left Arm, Patient Position: Sitting, Cuff Size: Normal)   Pulse 67   SpO2 95%  On exam patient is well-developed, well-nourished, no acute distress.  Breathing is unlabored.  She does not appear ill.  She is sitting up in office chair.  Abdominal incision is intact and healing nicely.  She does not have any surgical swelling with palpation.  I do not appreciate any subcutaneous fluid collection.  There is no erythema or cellulitic changes noted of abdomen.  JP drain is in place with some cloudy serosanguineous drainage noted.  No crepitus is noted with palpation.  A/P:  Patient is a 38 year old female status post panniculectomy with Dr. Waddell on 10/11/2023 complicated by development of a fluid collection after JP drain removals.  Subsequently had drain placed by IR on 11/19/2023.  She has been doing well without any infectious symptoms or concerning signs in her surgical site.  She is being treated for UTI at this time.  Patient's blood pressure is slightly soft today, but she is asymptomatic here in office.  Pulse is normal at 67.  She denies any  cardiac or pulmonary symptoms today.  Discussed with patient increase p.o. intake of fluids and continue to monitor for any changing symptoms.  Please notify our office of any symptomatic changes to her abdomen or concerning symptoms related to surgery.  Recommend following up in 1 week for reevaluation and to reevaluate for possible drain removal.  Patient should follow-up with PCP in regards to UTI management if symptoms do not resolve

## 2023-11-26 ENCOUNTER — Encounter (HOSPITAL_COMMUNITY): Payer: Self-pay

## 2023-11-26 ENCOUNTER — Other Ambulatory Visit (HOSPITAL_COMMUNITY): Payer: Self-pay | Admitting: Plastic Surgery

## 2023-11-26 DIAGNOSIS — S301XXA Contusion of abdominal wall, initial encounter: Secondary | ICD-10-CM

## 2023-12-02 ENCOUNTER — Ambulatory Visit (INDEPENDENT_AMBULATORY_CARE_PROVIDER_SITE_OTHER): Admitting: Surgical

## 2023-12-02 VITALS — BP 105/60

## 2023-12-02 DIAGNOSIS — S3011XA Contusion of abdominal wall, initial encounter: Secondary | ICD-10-CM

## 2023-12-02 DIAGNOSIS — Z9889 Other specified postprocedural states: Secondary | ICD-10-CM

## 2023-12-02 NOTE — Progress Notes (Signed)
 Patient is a 38 year old female here for follow-up after panniculectomy with Dr. Waddell on 10/11/2023.  She did have a fluid collection and underwent IR guided drain placement on 11/19/2023.  She reports over the last 24 hours she has been getting out about 20 cc.  She has been doing well without any issues.  The output over the last 24 hours has been approximately 10 cc of serous drainage.  The day before it was about 12 cc and the day before it was about 10 cc.  She reports that she has been starting to feel colder than normal, also has been chewing ice which she has never done before.  She is not having any vision changes, dizziness, fatigue.  She denies any chest pain or active shortness of breath.  Of note, hemoglobin was checked 2 days postoperatively and had dropped from 12.4-8.3. She reports she is otherwise feeling well.  She is not having any infectious symptoms.  She is overall happy with her results at this time.  She is requesting drain removal.  Chaperone present on exam BP 105/60  Patient is well-developed, well-nourished, no acute distress.  Breathing is normal.  Breathing is unlabored.  She does not appear ill. On exam abdominal incision is intact and appears to be healing well.  There is no signs of infection or concern on exam.  There is no tenderness with palpation.  Right JP drain is in place with about 10 cc of serous drainage in her bulb.  Abdomen is soft.  A/P:  Patient is overall doing well from a panniculectomy standpoint.  I do not see any signs of infection or concern on exam.  The right JP drain was removed, patient tolerated this well without any issues.  Dressing was placed over the JP drain insertion site.  Recommend changing this daily.  Offered patient follow-up visit in 2 weeks, she would like to just follow-up as needed and will call if she has any questions or concerns.  We discussed the importance of continuing with compressive garments 24/7 for the next 2 weeks  to avoid any rebound fluid collections.  Patient was agreeable to this.  We discussed recommendation for follow-up with PCP for evaluation of possible anemia.  Given her hemoglobin dropped from 12-8 postoperatively, with new onset cravings for ice.  Suspect some mild iron deficiency anemia is likely.  We discussed starting iron supplements, however patient reported some concerns about starting iron supplements as she had some issues in the past with it.  She is going to call her PCP today to schedule appointment next available.  She is aware to be evaluated sooner if she has any worsening symptoms.  Pictures were obtained of the patient and placed in the chart with the patient's or guardian's permission.

## 2023-12-07 ENCOUNTER — Encounter (HOSPITAL_COMMUNITY): Payer: Self-pay

## 2023-12-07 ENCOUNTER — Other Ambulatory Visit: Payer: Self-pay

## 2023-12-07 ENCOUNTER — Emergency Department (HOSPITAL_COMMUNITY)
Admission: EM | Admit: 2023-12-07 | Discharge: 2023-12-07 | Disposition: A | Discharge status: Home / Self Care | Attending: Emergency Medicine | Admitting: Emergency Medicine

## 2023-12-07 DIAGNOSIS — M546 Pain in thoracic spine: Secondary | ICD-10-CM | POA: Insufficient documentation

## 2023-12-07 DIAGNOSIS — D649 Anemia, unspecified: Secondary | ICD-10-CM | POA: Diagnosis not present

## 2023-12-07 DIAGNOSIS — R824 Acetonuria: Secondary | ICD-10-CM | POA: Insufficient documentation

## 2023-12-07 DIAGNOSIS — R6889 Other general symptoms and signs: Secondary | ICD-10-CM | POA: Insufficient documentation

## 2023-12-07 LAB — CBC WITH DIFFERENTIAL/PLATELET
Abs Immature Granulocytes: 0.01 K/uL (ref 0.00–0.07)
Basophils Absolute: 0 K/uL (ref 0.0–0.1)
Basophils Relative: 1 %
Eosinophils Absolute: 0.1 K/uL (ref 0.0–0.5)
Eosinophils Relative: 3 %
HCT: 34.1 % — ABNORMAL LOW (ref 36.0–46.0)
Hemoglobin: 9.8 g/dL — ABNORMAL LOW (ref 12.0–15.0)
Immature Granulocytes: 0 %
Lymphocytes Relative: 24 %
Lymphs Abs: 0.9 K/uL (ref 0.7–4.0)
MCH: 22.2 pg — ABNORMAL LOW (ref 26.0–34.0)
MCHC: 28.7 g/dL — ABNORMAL LOW (ref 30.0–36.0)
MCV: 77.1 fL — ABNORMAL LOW (ref 80.0–100.0)
Monocytes Absolute: 0.2 K/uL (ref 0.1–1.0)
Monocytes Relative: 5 %
Neutro Abs: 2.4 K/uL (ref 1.7–7.7)
Neutrophils Relative %: 67 %
Platelets: 346 K/uL (ref 150–400)
RBC: 4.42 MIL/uL (ref 3.87–5.11)
RDW: 17.1 % — ABNORMAL HIGH (ref 11.5–15.5)
WBC: 3.6 K/uL — ABNORMAL LOW (ref 4.0–10.5)
nRBC: 0 % (ref 0.0–0.2)

## 2023-12-07 LAB — URINALYSIS, ROUTINE W REFLEX MICROSCOPIC
Bilirubin Urine: NEGATIVE
Glucose, UA: NEGATIVE mg/dL
Hgb urine dipstick: NEGATIVE
Ketones, ur: 5 mg/dL — AB
Leukocytes,Ua: NEGATIVE
Nitrite: NEGATIVE
Protein, ur: NEGATIVE mg/dL
Specific Gravity, Urine: 1.025 (ref 1.005–1.030)
pH: 5 (ref 5.0–8.0)

## 2023-12-07 LAB — COMPREHENSIVE METABOLIC PANEL WITH GFR
ALT: 14 U/L (ref 0–44)
AST: 25 U/L (ref 15–41)
Albumin: 4 g/dL (ref 3.5–5.0)
Alkaline Phosphatase: 86 U/L (ref 38–126)
Anion gap: 12 (ref 5–15)
BUN: 12 mg/dL (ref 6–20)
CO2: 23 mmol/L (ref 22–32)
Calcium: 9.1 mg/dL (ref 8.9–10.3)
Chloride: 102 mmol/L (ref 98–111)
Creatinine, Ser: 0.73 mg/dL (ref 0.44–1.00)
GFR, Estimated: >60 mL/min (ref >60)
Glucose, Bld: 103 mg/dL — ABNORMAL HIGH (ref 70–99)
Potassium: 3.6 mmol/L (ref 3.5–5.1)
Sodium: 136 mmol/L (ref 135–145)
Total Bilirubin: 0.6 mg/dL (ref 0.0–1.2)
Total Protein: 6.9 g/dL (ref 6.5–8.1)

## 2023-12-07 LAB — MAGNESIUM: Magnesium: 1.8 mg/dL (ref 1.7–2.4)

## 2023-12-07 MED ORDER — LACTATED RINGERS IV BOLUS: 1000.0000 mL | Freq: Once | INTRAVENOUS | Status: DC

## 2023-12-07 NOTE — ED Provider Notes (Signed)
 [place] EMERGENCY DEPARTMENT AT [facility] Provider Note   CSN: [id number] Arrival date & time: 12/07/23  9190     Patient presents with: Back Pain   Kathy Burns is a 38 y.o. female.   HPI Patient presents back pain.  She also has generalized tingling sensation and comfort without focal weakness, without syncope without abdominal pain.  Patient does have a history of gastric bypass about 2 years ago, as well as pan ectomy.    Prior to Admission medications   Medication Sig Start Date End Date Taking? Authorizing Provider  albuterol  (VENTOLIN  HFA) 108 (90 Base) MCG/ACT inhaler Inhale 2 puffs into the lungs every 6 (six) hours as needed for wheezing or shortness of breath. 12/08/22   Tanda Bleacher, MD  fluconazole  (DIFLUCAN ) 150 MG tablet Take 1 tablet (150 mg total) by mouth once as needed for up to 2 doses (take one pill on day 1, and the second pill 3 days later). 11/20/23   Rising, Rebecca, [name]  IBUPROFEN PO Take by mouth.    [provider]  nitrofurantoin  (MACRODANTIN ) 100 MG capsule Take 100 mg by mouth daily. 11/16/23   [provider]  ondansetron  (ZOFRAN -ODT) 4 MG disintegrating tablet Take 1 tablet (4 mg total) by mouth every 8 (eight) hours as needed for nausea or vomiting. 10/26/23   Sofia, [name], [name]    Allergies: Brassica oleracea, Tylenol  [acetaminophen ], and Codeine    Review of Systems  Updated Vital Signs BP 123/73 (BP Location: Left Arm)   Pulse (!) 52   Temp 98.7 F (37.1 C) (Oral)   Resp 16   SpO2 100%   Physical Exam Vitals and nursing note reviewed.  Constitutional:      General: She is not in acute distress.    Appearance: She is well-developed.  HENT:     Head: Normocephalic and atraumatic.  Eyes:     Conjunctiva/sclera: Conjunctivae normal.  Pulmonary:     Effort: Pulmonary effort is normal. No respiratory distress.     Breath sounds: No stridor.  Abdominal:     General: There is no distension.  Skin:     General: Skin is warm and dry.  Neurological:     Mental Status: She is alert and oriented to person, place, and time.     Cranial Nerves: No cranial nerve deficit.  Psychiatric:        Mood and Affect: Mood normal.     (all labs ordered are listed, but only abnormal results are displayed) Labs Reviewed  CBC WITH DIFFERENTIAL/PLATELET - Abnormal; Notable for the following components:      Result Value   WBC 3.6 (*)    Hemoglobin 9.8 (*)    HCT 34.1 (*)    MCV 77.1 (*)    MCH 22.2 (*)    MCHC 28.7 (*)    RDW 17.1 (*)    All other components within normal limits  COMPREHENSIVE METABOLIC PANEL WITH GFR - Abnormal; Notable for the following components:   Glucose, Bld 103 (*)    All other components within normal limits  URINALYSIS, ROUTINE W REFLEX MICROSCOPIC - Abnormal; Notable for the following components:   Color, Urine AMBER (*)    APPearance HAZY (*)    Ketones, ur 5 (*)    All other components within normal limits  MAGNESIUM  HCG, QUANTITATIVE, PREGNANCY    EKG: None  Radiology: No results found.   Procedures   Medications Ordered in the ED  lactated ringers  bolus 1,000 mL (0 mLs Intravenous Hold 12/07/23 1108)                                    Medical Decision Making Adult female presents with sensation of feeling off, back pain.  She is awake, alert, speaking clearly, has no focal neurodeficits, is hemodynamically unremarkable, afebrile, providing her own history.  Considerations include infection, anemia, electrolyte deficiency secondary to prior gastric bypass, less likely bacteremia, sepsis without hypotension, fever or tachycardia.  Labs sent.  Amount and/or Complexity of Data Reviewed Labs: ordered. Decision-making details documented in ED Course.  Risk Prescription drug management. Decision regarding hospitalization.  Patient with mild ketone urea, fluids provided 11:31 AM Patient in no distress, awake, alert, hemodynamically unremarkable.   Labs generally unremarkable, she does have mild anemia, likely contributing to her cold sensation.  Otherwise electrolytes within normal limits, urinalysis with mild ketone urea, otherwise reassuring. We discussed all findings, patient without evidence for acute new pathology here, will follow-up with outpatient clinic. Patient recently moved from [place]  has no physicians here, given her anemia, prior gastric bypass, cold sensation, referral for our clinic provided.    Final diagnoses:  Midline thoracic back pain, unspecified chronicity  Sensation of feeling cold    ED Discharge Orders     None          Garrick Charleston, MD 12/07/23 1131

## 2023-12-07 NOTE — Discharge Instructions (Signed)
 Please follow-up at our [facility].  The phone number is included below.  Return here for concerning changes in your condition.

## 2023-12-07 NOTE — ED Notes (Signed)
 Pt has refused fluids as she states the doctor was just in here, he said he's going to release me Pt also states she has been stuck x3 and would like to avoid it and would like to be discharged,

## 2023-12-07 NOTE — ED Triage Notes (Signed)
 Pt reports with lower back pain for a while along with feeling cold for 3-4 weeks. Pt states that something is wrong and she just doesn't know what it is. Pt has a hx of gastric bypass surgery.

## 2023-12-08 ENCOUNTER — Other Ambulatory Visit: Payer: Self-pay

## 2023-12-08 ENCOUNTER — Ambulatory Visit
Admission: EM | Admit: 2023-12-08 | Discharge: 2023-12-08 | Disposition: A | Discharge status: Home / Self Care | Attending: Physician Assistant | Admitting: Physician Assistant

## 2023-12-08 DIAGNOSIS — M546 Pain in thoracic spine: Secondary | ICD-10-CM | POA: Diagnosis not present

## 2023-12-08 MED ORDER — KETOROLAC TROMETHAMINE 30 MG/ML IJ SOLN
30.0000 mg | Freq: Once | INTRAMUSCULAR | Status: AC
  Administered 2023-12-08: 30 mg via INTRAMUSCULAR

## 2023-12-08 MED ORDER — LIDOCAINE 5 % EX PTCH: 1.0000 | MEDICATED_PATCH | CUTANEOUS | 0 refills | Status: AC

## 2023-12-08 NOTE — ED Triage Notes (Signed)
 Pt presents with a chief complaint of mid-back pain. Pain began to worsen about two days ago. Currently rates pain a 4/10. Describes the pain as burning and very uncomfortable. Began to twitch + tingle yesterday. Went to ED after this occurred. No medications prescribed there. Negative for UTI.

## 2023-12-08 NOTE — Discharge Instructions (Addendum)
 VISIT SUMMARY:  You came in today with severe back pain and tingling sensations. The pain started suddenly last night and is located high on your back, sometimes radiating to the other side. The pain is severe with movement but subsides when you sit or lie down in certain positions. You also experienced a tingling sensation throughout your body yesterday, but it has not recurred today. You have a history of back pain related to epidurals or spinal procedures during childbirth and recently had a pannectomy.   YOUR PLAN:  -THORACIC BACK PAIN, RIGHT PARASPINAL REGION: You have acute thoracic back pain in the right paraspinal region, which is likely muscular in nature. This type of pain is severe with movement but can be alleviated by certain positions. We administered a Toradol  injection for pain relief and prescribed lidocaine  patches for topical analgesia. We recommend using warm compresses and doing gentle stretches. Please avoid NSAIDs due to your history of bariatric surgery unless cleared by your surgeon. Follow up with your primary care doctor if your symptoms do not improve or worsen.  -BARIATRIC SURGERY: Your history of bariatric surgery affects your medication choices because certain medications can cause gastric irritation. Specifically, NSAIDs should be avoided. We have educated you on this to prevent any potential complications.  INSTRUCTIONS:  Please follow up with your primary care doctor if your back pain symptoms do not improve or worsen. Avoid using NSAIDs due to your bariatric surgery history.

## 2023-12-08 NOTE — ED Provider Notes (Signed)
 [facility]    CSN: [id number] Arrival date & time: 12/08/23  1311      History   Chief Complaint Chief Complaint  Patient presents with   Back Pain    HPI Kathy Burns is a 38 y.o. female.  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   HPI  Discussed the use of AI scribe software for clinical note transcription with the patient, who gave verbal consent to proceed.  The patient presents with back pain and tingling sensations.  Back pain began suddenly the previous night, originating in the spine, high on the back, and sometimes radiating to the other side. The pain is severe, rated 7-8/10 with movement, but subsides when sitting or lying in certain positions. If I sit down or lay down in a certain position, the pain goes away for a little bit and then comes right back if I stand up.  She experienced a tingling sensation throughout her body yesterday, which led her to stop her activities. She is unsure if this is related to her back pain. No numbness or tingling today.  She has a history of back pain associated with epidurals or spinal procedures during childbirth. No recent injuries or trauma to the area. She underwent a polyctomy about six weeks ago and has been dealing with fluid retention since then.  Current medications include methadone 38 mg daily and ibuprofen for pain relief, though she has not taken any today. She avoids Tylenol  due to heart palpitations. She also takes Zofran  two to three times daily due to complications from previous bariatric surgery.  No chest pain,  heartburn, or recent injuries. She reports chills but no fever. She has a history of low blood pressure noted by her surgeon last week.    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 02/14/2019   Last Assessment & Plan:    Patient is a 38 yo [id number] at [redacted]w[redacted]d with a pregnancy complicated by multiple fetal anomalies, potentially compromising the airway, admitted for extended monitoring and steroids in the setting of BPP 6/10 on day of admission at the MFM office       -  BMZ#1 on 12/15 at 2203, BMZ#2 on 12/16 at 1009   - FHT: category I throughout inpatient admission   - 12/16: BPP 8/8     Patient Active Problem List   Diagnosis Date Noted   Well woman exam with routine gynecological exam 05/31/2023   Atypical chest pain 03/11/2023   Palpitations 03/11/2023   Admission for palliative care 10/22/2022   RLS (restless legs syndrome) 11/19/2021   OSA (obstructive sleep apnea) 07/29/2021  COPD, moderate (HCC) 04/03/2021   Gastroesophageal reflux disease without esophagitis 02/04/2021   History of substance abuse (HCC) 02/04/2021   Morbid obesity (HCC) 02/04/2021   Chronic RUQ pain 10/25/2019   Fetal anomaly necessitating delivery 02/27/2019   Bipolar disorder, in partial remission, most recent episode mixed (HCC) 06/15/2016   Chronic hepatitis C without hepatic coma (HCC) 06/15/2016   Heavy smoker 06/15/2016   Herpes simplex antibody positive 06/15/2016   Cellulitis 02/15/2013   Polysubstance  abuse (HCC) 02/15/2013    Past Surgical History:  Procedure Laterality Date   CESAREAN SECTION     x 4   CHOLECYSTECTOMY     GASTRIC BYPASS     IR IMAGE GUIDED DRAINAGE BY PERCUTANEOUS CATHETER  11/19/2023   PANNICULECTOMY N/A 10/11/2023   Procedure: PANNICULECTOMY;  Surgeon: Waddell Leonce NOVAK, MD;  Location: [facility];  Service: Plastics;  Laterality: N/A;   REMOVAL OF EAR TUBE     TEMPOROMANDIBULAR JOINT ARTHROPLASTY     TONSILLECTOMY AND ADENOIDECTOMY     TUBAL LIGATION      OB History     Gravida  5   Para  4   Term  4   Preterm      AB  1   Living  4      SAB      IAB  1   Ectopic      Multiple      Live Births  4            Home Medications    Prior to Admission medications   Medication Sig Start Date End Date Taking? Authorizing Provider  lidocaine  (LIDODERM ) 5 % Place 1 patch onto the skin daily. Remove & Discard patch within 12 hours or as directed by MD 12/08/23  Yes Treanna Dumler E, [name]  albuterol  (VENTOLIN  HFA) 108 (90 Base) MCG/ACT inhaler Inhale 2 puffs into the lungs every 6 (six) hours as needed for wheezing or shortness of breath. 12/08/22   Tanda Bleacher, MD  fluconazole  (DIFLUCAN ) 150 MG tablet Take 1 tablet (150 mg total) by mouth once as needed for up to 2 doses (take one pill on day 1, and the second pill 3 days later). 11/20/23   Rising, Rebecca, [name]  IBUPROFEN PO Take by mouth.    [provider]  nitrofurantoin  (MACRODANTIN ) 100 MG capsule Take 100 mg by mouth daily. 11/16/23   [provider]  ondansetron  (ZOFRAN -ODT) 4 MG disintegrating tablet Take 1 tablet (4 mg total) by mouth every 8 (eight) hours as needed for nausea or vomiting. 10/26/23   Flint Sonny POUR, [name]    Family History Family History  Problem Relation Age of Onset   Ovarian cancer Mother    Heart disease Maternal Grandmother    Heart disease Maternal Grandfather    Breast cancer Paternal Grandmother    Esophageal cancer Paternal  Grandfather    Hypertension Other    Diabetes Other    Cancer Other    Stroke Other    Breast cancer Paternal Aunt     Social History Social History   Tobacco Use   Smoking status: Every Day    Current packs/day: 0.00    Types: Cigarettes    Last attempt to quit: 2023    Years since quitting: 2.7    Passive exposure: Current   Smokeless tobacco: Never  Vaping Use   Vaping status: Every Day   Substances: Nicotine , Flavoring  Substance Use Topics   Alcohol  use: Yes    Comment: a pint or more a day -beer and liquor   Drug use: Not Currently    Types: Marijuana, Cocaine, Heroin    Comment: 02/14/2013 last use, heroine last use 11/21/14     Allergies   Brassica oleracea, Tylenol  [acetaminophen ], and Codeine   Review of Systems Review of Systems  Constitutional:  Positive for chills. Negative for fever.  Cardiovascular:  Negative for chest pain.  Musculoskeletal:  Positive for back pain (mid thoracic pain). Negative for gait problem, neck pain and neck stiffness.  Neurological:  Positive for numbness (full body).     Physical Exam Triage Vital Signs ED Triage Vitals  Encounter Vitals Group     BP 12/08/23 1327 123/69     Girls Systolic BP Percentile --      Girls Diastolic BP Percentile --      Boys Systolic BP Percentile --      Boys Diastolic BP Percentile --      Pulse Rate 12/08/23 1327 (!) 54     Resp 12/08/23 1327 16     Temp 12/08/23 1327 98.4 F (36.9 C)     Temp Source 12/08/23 1327 Temporal     SpO2 12/08/23 1327 94 %     Weight 12/08/23 1327 142 lb (64.4 kg)     Height 12/08/23 1327 5' 5.5 (1.664 m)     Head Circumference --      Peak Flow --      Pain Score 12/08/23 1345 4     Pain Loc --      Pain Education --      Exclude from Growth Chart --    No data found.  Updated Vital Signs BP 123/69 (BP Location: Right Arm)   Pulse (!) 54   Temp 98.4 F (36.9 C) (Temporal)   Resp 16   Ht 5' 5.5 (1.664 m)   Wt 142 lb (64.4 kg)   SpO2 94%    BMI 23.27 kg/m   Visual Acuity Right Eye Distance:   Left Eye Distance:   Bilateral Distance:    Right Eye Near:   Left Eye Near:    Bilateral Near:     Physical Exam Vitals reviewed.  Constitutional:      General: She is awake.     Appearance: Normal appearance. She is well-developed and well-groomed.  HENT:     Head: Normocephalic and atraumatic.  Eyes:     General: Lids are normal. Gaze aligned appropriately.     Extraocular Movements: Extraocular movements intact.     Conjunctiva/sclera: Conjunctivae normal.  Pulmonary:     Effort: Pulmonary effort is normal.  Musculoskeletal:     Cervical back: Normal.     Thoracic back: Tenderness and bony tenderness present. Normal range of motion.     Lumbar back: Normal.       Back:     Comments: No obvious step-offs or abnormalities to palpation of the cervical, thoracic, lumbar spines  Neurological:     Mental Status: She is alert and oriented to person, place, and time.  Psychiatric:        Attention and Perception: Attention and perception normal.        Mood and Affect: Mood and affect normal.        Speech: Speech normal.        Behavior: Behavior normal. Behavior is cooperative.      UC Treatments / Results  Labs (all labs ordered are listed, but  only abnormal results are displayed) Labs Reviewed - No data to display  EKG   Radiology No results found.  Procedures Procedures (including critical care time)  Medications Ordered in UC Medications  ketorolac  (TORADOL ) 30 MG/ML injection 30 mg (30 mg Intramuscular Given 12/08/23 1452)    Initial Impression / Assessment and Plan / UC Course  I have reviewed the triage vital signs and the nursing notes.  Pertinent labs & imaging results that were available during my care of the patient were reviewed by me and considered in my medical decision making (see chart for details).      Final Clinical Impressions(s) / UC Diagnoses   Final diagnoses:  Acute  right-sided thoracic back pain  Thoracic back pain, right paraspinal region Acute thoracic back pain in the right paraspinal region, severe (7-8/10) with movement, alleviated by certain positions. Likely muscular in nature. No associated chest pain or heartburn. NSAIDs contraindicated due to bariatric surgery history the patient states that she cannot have Tylenol  due to previous symptoms/side effects and she has been taking NSAIDs since her surgery. - Administer Toradol  injection for pain relief - Prescribe lidocaine  patches for topical analgesia - Recommend warm compresses and gentle stretches - Advise against NSAIDs due to bariatric surgery history going forward unless approved by surgical specialty - Instruct to follow up with primary care if symptoms do not improve or worsen  Bariatric surgery Bariatric surgery impacts medication choices due to potential gastric irritation. Previously unaware of NSAID contraindication post-surgery. - Educate on avoiding NSAIDs due to risk of gastric irritation post-bariatric surgery.  Recommend following up with surgical team for clearance to use NSAIDs    Discharge Instructions      VISIT SUMMARY:  You came in today with severe back pain and tingling sensations. The pain started suddenly last night and is located high on your back, sometimes radiating to the other side. The pain is severe with movement but subsides when you sit or lie down in certain positions. You also experienced a tingling sensation throughout your body yesterday, but it has not recurred today. You have a history of back pain related to epidurals or spinal procedures during childbirth and recently had a pannectomy.   YOUR PLAN:  -THORACIC BACK PAIN, RIGHT PARASPINAL REGION: You have acute thoracic back pain in the right paraspinal region, which is likely muscular in nature. This type of pain is severe with movement but can be alleviated by certain positions. We administered a Toradol   injection for pain relief and prescribed lidocaine  patches for topical analgesia. We recommend using warm compresses and doing gentle stretches. Please avoid NSAIDs due to your history of bariatric surgery unless cleared by your surgeon. Follow up with your primary care doctor if your symptoms do not improve or worsen.  -BARIATRIC SURGERY: Your history of bariatric surgery affects your medication choices because certain medications can cause gastric irritation. Specifically, NSAIDs should be avoided. We have educated you on this to prevent any potential complications.  INSTRUCTIONS:  Please follow up with your primary care doctor if your back pain symptoms do not improve or worsen. Avoid using NSAIDs due to your bariatric surgery history.     ED Prescriptions     Medication Sig Dispense Auth. Provider   lidocaine  (LIDODERM ) 5 % Place 1 patch onto the skin daily. Remove & Discard patch within 12 hours or as directed by MD 30 patch Rosser Collington E, [name]      PDMP not reviewed this encounter.   Bodie Abernethy,  Rocky BRAVO, [name] 12/09/23 1607

## 2023-12-10 ENCOUNTER — Encounter: Payer: Self-pay | Admitting: Surgical

## 2023-12-10 ENCOUNTER — Ambulatory Visit: Admitting: Surgical

## 2023-12-10 DIAGNOSIS — Z9889 Other specified postprocedural states: Secondary | ICD-10-CM

## 2023-12-10 DIAGNOSIS — S3011XA Contusion of abdominal wall, initial encounter: Secondary | ICD-10-CM

## 2023-12-10 NOTE — Progress Notes (Signed)
   Referring Provider Lorren Greig PARAS, NP [address] [place],  [place] [address]   CC:  Chief Complaint  Patient presents with   Post-op Follow-up      Kathy Burns is an 38 y.o. female.  HPI: Patient is a 38 year old female who underwent panniculectomy Dr. Waddell on 10/11/2023.  Postoperatively she developed a recurrent seroma, had a drain placed by IR on 11/19/2023.  She was subsequently seen in the office for 2 visits afterward, drain output on 12/02/2023 had been approximately 10-12 cc or less per 24 hours per EMR review.  Patient presents today after noticing increased swelling.  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  She feels as if being back at work so soon has caused the increase in swelling.  She feels as if she has recurrent fluid collection.  She is not having any infectious symptoms, but does report some slight increased pain.  Of note, she has been seen in the ED and urgent care on both 12/07/2023 and 12/08/2023 for back pain, unrelated to her abdominal pannus.  She does not report any concerns related to that to me today.  Review of Systems General: No fevers  Physical Exam    12/08/2023    1:27 PM 12/07/2023    8:17 AM 12/02/2023    2:01 PM  Vitals with BMI  Height 5' 5.5    Weight 142 lbs    BMI 23.26    Systolic 123 123 894  Diastolic 69 73 60  Pulse 54 52     General:  No acute distress,  Alert and oriented, Non-Toxic, Normal speech and affect Breathing is unlabored. Abdomen: Abdominal incision is intact, well-healed.  She does have some postsurgical swelling noted, some subcutaneous fluid collection noted with palpation of her lower abdomen.  There is no overlying skin changes.  No erythema or cellulitic changes.  No active drainage noted from her incisions.  Assessment/Plan 38 year old female status post panniculectomy on 10/11/2023.  Had a postoperative fluid collection after drain removal and  underwent IR guided drain placement on 11/19/2023.  Drain was removed on 12/02/2023 due to output being between 10 to 12 cc per 24 hours for 3 consecutive days.  Today she presents with possible recurrence of fluid collection/seroma.  She does have some subcutaneous fluid collection noted on exam, difficult to determine how much, but some obvious swelling is present.  Will place order for repeat ultrasound and possible IR guided drain placement.  Kathy Burns Kathy Burns 12/10/2023, 8:51 AM

## 2023-12-13 ENCOUNTER — Other Ambulatory Visit: Payer: Self-pay

## 2023-12-14 ENCOUNTER — Other Ambulatory Visit: Payer: Self-pay | Admitting: Physician Assistant

## 2023-12-14 ENCOUNTER — Encounter (HOSPITAL_COMMUNITY): Payer: Self-pay

## 2023-12-14 ENCOUNTER — Ambulatory Visit (HOSPITAL_COMMUNITY)
Admission: RE | Admit: 2023-12-14 | Discharge: 2023-12-14 | Disposition: A | Source: Ambulatory Visit | Attending: Physician Assistant

## 2023-12-14 ENCOUNTER — Ambulatory Visit (HOSPITAL_COMMUNITY)
Admission: RE | Admit: 2023-12-14 | Discharge: 2023-12-14 | Disposition: A | Discharge status: Home / Self Care | Source: Ambulatory Visit | Attending: Surgical | Admitting: Surgical

## 2023-12-14 DIAGNOSIS — Y839 Surgical procedure, unspecified as the cause of abnormal reaction of the patient, or of later complication, without mention of misadventure at the time of the procedure: Secondary | ICD-10-CM | POA: Insufficient documentation

## 2023-12-14 DIAGNOSIS — R600 Localized edema: Secondary | ICD-10-CM | POA: Diagnosis not present

## 2023-12-14 DIAGNOSIS — Z9889 Other specified postprocedural states: Secondary | ICD-10-CM

## 2023-12-14 DIAGNOSIS — S3011XA Contusion of abdominal wall, initial encounter: Secondary | ICD-10-CM | POA: Insufficient documentation

## 2023-12-14 MED ORDER — SODIUM CHLORIDE 0.9 % IV SOLN: INTRAVENOUS | Status: DC

## 2023-12-16 ENCOUNTER — Encounter: Payer: Self-pay | Admitting: Surgical

## 2023-12-16 ENCOUNTER — Telehealth: Admitting: Physician Assistant

## 2023-12-16 ENCOUNTER — Ambulatory Visit (INDEPENDENT_AMBULATORY_CARE_PROVIDER_SITE_OTHER): Admitting: Surgical

## 2023-12-16 VITALS — BP 109/72 | HR 64 | Ht 65.5 in | Wt 145.5 lb

## 2023-12-16 DIAGNOSIS — J069 Acute upper respiratory infection, unspecified: Secondary | ICD-10-CM

## 2023-12-16 DIAGNOSIS — Z9889 Other specified postprocedural states: Secondary | ICD-10-CM

## 2023-12-16 MED ORDER — ALBUTEROL SULFATE HFA 108 (90 BASE) MCG/ACT IN AERS: 1.0000 | INHALATION_SPRAY | Freq: Four times a day (QID) | RESPIRATORY_TRACT | 0 refills | Status: DC | PRN

## 2023-12-16 MED ORDER — FLUTICASONE PROPIONATE 50 MCG/ACT NA SUSP: 2.0000 | Freq: Every day | NASAL | 0 refills | Status: DC

## 2023-12-16 MED ORDER — PSEUDOEPH-BROMPHEN-DM 30-2-10 MG/5ML PO SYRP: 5.0000 mL | ORAL_SOLUTION | Freq: Four times a day (QID) | ORAL | 0 refills | Status: DC | PRN

## 2023-12-16 NOTE — Progress Notes (Signed)

## 2023-12-16 NOTE — Addendum Note (Signed)
 Addended by: VIVIENNE DELON HERO on: 12/16/2023 04:38 PM   Modules accepted: Orders

## 2023-12-16 NOTE — Progress Notes (Signed)
 Patient is a 38 year old female here for follow-up after panniculectomy with Dr. Waddell on 10/11/2023.  Postoperatively she had to have a drain placed by IR due to residual seroma.  She was seen here in the office about a week ago, reported noticing increased swelling.  Had some potential subcutaneous fluid, was sent for ultrasound and possible drain placement.  Had this procedure on 12/14/2023.  There was no focal drainable collection along the panniculectomy scar.  She did have some subcutaneous edema without any large-volume fluid collection.  Patient reports she is overall doing well, has not noticed any increase swelling.  She does feel as if she notices her lower abdomen bulges out more.    On exam abdominal incision is intact and well-healed.  There is no overlying skin changes of her abdomen or along the panniculectomy scar.  Patient is well-developed, well-nourished, no acute distress.  A/P:  Discussed with patient ultrasound results, recommend continue with compression for the next few weeks.  Increase activity as tolerated.  Recommend following up as needed.  Call with questions or concerns.

## 2023-12-21 ENCOUNTER — Ambulatory Visit
Admission: RE | Admit: 2023-12-21 | Discharge: 2023-12-21 | Disposition: A | Discharge status: Home / Self Care | Source: Ambulatory Visit | Attending: Emergency Medicine | Admitting: Emergency Medicine

## 2023-12-21 VITALS — BP 128/74 | HR 63 | Temp 98.1°F | Resp 16

## 2023-12-21 DIAGNOSIS — R051 Acute cough: Secondary | ICD-10-CM

## 2023-12-21 DIAGNOSIS — F17200 Nicotine dependence, unspecified, uncomplicated: Secondary | ICD-10-CM | POA: Diagnosis not present

## 2023-12-21 DIAGNOSIS — J441 Chronic obstructive pulmonary disease with (acute) exacerbation: Secondary | ICD-10-CM

## 2023-12-21 MED ORDER — METHYLPREDNISOLONE 4 MG PO TBPK: ORAL_TABLET | ORAL | 0 refills | Status: DC

## 2023-12-21 MED ORDER — BENZONATATE 100 MG PO CAPS: 100.0000 mg | ORAL_CAPSULE | Freq: Three times a day (TID) | ORAL | 0 refills | Status: DC

## 2023-12-21 MED ORDER — AMOXICILLIN-POT CLAVULANATE 875-125 MG PO TABS: 1.0000 | ORAL_TABLET | Freq: Two times a day (BID) | ORAL | 0 refills | Status: DC

## 2023-12-21 NOTE — ED Provider Notes (Signed)
 [facility]    CSN: [id number] Arrival date & time: 12/21/23  1145      History   Chief Complaint Chief Complaint  Patient presents with   Cough    Entered by patient    HPI Kathy Burns is a 38 y.o. female.   Kathy Burns, 38 year old female, presents to urgent care for cough, congestion, sob, thick mucous x 1 week. Pt states she has hx of copd and started smoking 1 week ago.   PMH: Hepatitis, alcoholism, COPD, anxiety , polysubstance abuse , heroin abuse(2014)  The history is provided by the patient. No language interpreter was used.    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 02/14/2019   Last Assessment & Plan:    Patient is a 38 yo G4P3003 at [redacted]w[redacted]d with a pregnancy complicated by multiple fetal anomalies, potentially compromising the airway, admitted for extended monitoring and steroids in the setting of BPP 6/10 on day of admission at the MFM office       -  BMZ#1 on 12/15 at 2203, BMZ#2 on 12/16 at 1009   - FHT: category I throughout inpatient admission   - 12/16: BPP 8/8     Patient Active Problem List   Diagnosis Date Noted   COPD exacerbation (HCC) 12/21/2023   Acute cough 12/21/2023   Well woman exam with routine gynecological exam 05/31/2023   Atypical chest pain 03/11/2023   Palpitations 03/11/2023   Admission for palliative care 10/22/2022   RLS (restless legs syndrome) 11/19/2021   OSA (obstructive sleep apnea) 07/29/2021   COPD, moderate (HCC)  04/03/2021   Gastroesophageal reflux disease without esophagitis 02/04/2021   History of substance abuse (HCC) 02/04/2021   Morbid obesity (HCC) 02/04/2021   Chronic RUQ pain 10/25/2019   Fetal anomaly necessitating delivery 02/27/2019   Bipolar disorder, in partial remission, most recent episode mixed (HCC) 06/15/2016   Chronic hepatitis C without hepatic coma (HCC) 06/15/2016   Smoker 06/15/2016   Herpes simplex antibody positive 06/15/2016   Cellulitis 02/15/2013   Polysubstance abuse (HCC) 02/15/2013    Past Surgical History:  Procedure Laterality Date   CESAREAN SECTION     x 4   CHOLECYSTECTOMY     GASTRIC BYPASS     IR IMAGE GUIDED DRAINAGE BY PERCUTANEOUS CATHETER  11/19/2023   PANNICULECTOMY N/A 10/11/2023   Procedure: PANNICULECTOMY;  Surgeon: Waddell Leonce NOVAK, MD;  Location: [facility];  Service: Plastics;  Laterality: N/A;   REMOVAL OF EAR TUBE     TEMPOROMANDIBULAR JOINT ARTHROPLASTY     TONSILLECTOMY AND ADENOIDECTOMY     TUBAL LIGATION      OB History     Gravida  5   Para  4   Term  4   Preterm      AB  1   Living  4      SAB  IAB  1   Ectopic      Multiple      Live Births  4            Home Medications    Prior to Admission medications   Medication Sig Start Date End Date Taking? Authorizing Provider  amoxicillin -clavulanate (AUGMENTIN ) 875-125 MG tablet Take 1 tablet by mouth every 12 (twelve) hours. 12/21/23  Yes Yohan Samons, Rilla, NP  benzonatate  (TESSALON ) 100 MG capsule Take 1 capsule (100 mg total) by mouth every 8 (eight) hours. 12/21/23  Yes Markise Haymer, NP  methylPREDNISolone (MEDROL DOSEPAK) 4 MG TBPK tablet Take as directed 12/21/23  Yes Shamell Hittle, Rilla, NP  albuterol  (VENTOLIN  HFA) 108 (90 Base) MCG/ACT inhaler Inhale 1-2 puffs into the lungs every 6 (six) hours as needed. 12/16/23   Vivienne Delon HERO, [name]  brompheniramine-pseudoephedrine-DM 30-2-10 MG/5ML syrup Take 5 mLs by mouth 4  (four) times daily as needed. 12/16/23   Vivienne Delon HERO, [name]  fluconazole  (DIFLUCAN ) 150 MG tablet Take 1 tablet (150 mg total) by mouth once as needed for up to 2 doses (take one pill on day 1, and the second pill 3 days later). 11/20/23   Rising, Rebecca, [name]  fluticasone (FLONASE) 50 MCG/ACT nasal spray Place 2 sprays into both nostrils daily. 12/16/23   Vivienne Delon HERO, [name]  IBUPROFEN PO Take by mouth.    [provider]  lidocaine  (LIDODERM ) 5 % Place 1 patch onto the skin daily. Remove & Discard patch within 12 hours or as directed by MD 12/08/23   Mecum, Erin E, [name]  nitrofurantoin  (MACRODANTIN ) 100 MG capsule Take 100 mg by mouth daily. 11/16/23   [provider]  ondansetron  (ZOFRAN -ODT) 4 MG disintegrating tablet Take 1 tablet (4 mg total) by mouth every 8 (eight) hours as needed for nausea or vomiting. 10/26/23   Flint Sonny POUR, [name]    Family History Family History  Problem Relation Age of Onset   Ovarian cancer Mother    Heart disease Maternal Grandmother    Heart disease Maternal Grandfather    Breast cancer Paternal Grandmother    Esophageal cancer Paternal Grandfather    Hypertension Other    Diabetes Other    Cancer Other    Stroke Other    Breast cancer Paternal Aunt     Social History Social History   Tobacco Use   Smoking status: Every Day    Current packs/day: 0.00    Types: Cigarettes    Last attempt to quit: 2023    Years since quitting: 2.8    Passive exposure: Current   Smokeless tobacco: Never  Vaping Use   Vaping status: Every Day   Substances: Nicotine , Flavoring  Substance Use Topics   Alcohol use: Yes    Comment: a pint or more a day -beer and liquor   Drug use: Not Currently    Types: Marijuana, Cocaine, Heroin    Comment: 02/14/2013 last use, heroine last use 11/21/14     Allergies   Brassica oleracea, Tylenol  [acetaminophen ], and Codeine   Review of Systems Review of Systems  Constitutional:  Negative  for fever.  HENT:  Positive for congestion.   Respiratory:  Positive for cough and shortness of breath. Negative for wheezing and stridor.   Cardiovascular:  Negative for chest pain and palpitations.  All other systems reviewed and are negative.    Physical Exam Triage Vital Signs ED Triage Vitals  Encounter Vitals Group     BP  Girls Systolic BP Percentile      Girls Diastolic BP Percentile      Boys Systolic BP Percentile      Boys Diastolic BP Percentile      Pulse      Resp      Temp      Temp src      SpO2      Weight      Height      Head Circumference      Peak Flow      Pain Score      Pain Loc      Pain Education      Exclude from Growth Chart    No data found.  Updated Vital Signs BP 128/74 (BP Location: Right Arm)   Pulse 63   Temp 98.1 F (36.7 C) (Oral)   Resp 16   SpO2 97%   Visual Acuity Right Eye Distance:   Left Eye Distance:   Bilateral Distance:    Right Eye Near:   Left Eye Near:    Bilateral Near:     Physical Exam Vitals and nursing note reviewed.  Constitutional:      General: She is not in acute distress.    Appearance: She is well-developed.  HENT:     Head: Normocephalic.     Right Ear: Tympanic membrane is retracted.     Left Ear: Tympanic membrane is retracted.     Nose: Congestion present.     Mouth/Throat:     Lips: Pink.     Mouth: Mucous membranes are moist.     Pharynx: Oropharynx is clear.  Eyes:     General: Lids are normal.     Conjunctiva/sclera: Conjunctivae normal.     Pupils: Pupils are equal, round, and reactive to light.  Neck:     Trachea: No tracheal deviation.  Cardiovascular:     Rate and Rhythm: Normal rate and regular rhythm.     Heart sounds: Normal heart sounds. No murmur heard. Pulmonary:     Effort: Pulmonary effort is normal.     Breath sounds: Normal breath sounds and air entry.     Comments: Coarse throughout Abdominal:     General: Bowel sounds are normal.     Palpations: Abdomen  is soft.     Tenderness: There is no abdominal tenderness.  Musculoskeletal:        General: Normal range of motion.     Cervical back: Normal range of motion.  Lymphadenopathy:     Cervical: No cervical adenopathy.  Skin:    General: Skin is warm and dry.     Findings: No rash.  Neurological:     General: No focal deficit present.     Mental Status: She is alert and oriented to person, place, and time.     GCS: GCS eye subscore is 4. GCS verbal subscore is 5. GCS motor subscore is 6.  Psychiatric:        Speech: Speech normal.        Behavior: Behavior normal. Behavior is cooperative.      UC Treatments / Results  Labs (all labs ordered are listed, but only abnormal results are displayed) Labs Reviewed - No data to display  EKG   Radiology No results found.  Procedures Procedures (including critical care time)  Medications Ordered in UC Medications - No data to display  Initial Impression / Assessment and Plan / UC Course  I have reviewed the triage  vital signs and the nursing notes.  Pertinent labs & imaging results that were available during my care of the patient were reviewed by me and considered in my medical decision making (see chart for details).    Discussed exam findings and plan of care with patient, Augmentin , medrol dose pk, tessalon  scripted, strict go to ER precautions given.   Patient verbalized understanding to this provider.  Ddx: CCOPD exacerbation, smoker, URI, allergies, pneumonia Final Clinical Impressions(s) / UC Diagnoses   Final diagnoses:  COPD exacerbation (HCC)  Smoker  Acute cough     Discharge Instructions      Take meds as directed(Augmentin , medrol dose pk, tessalon ), pt has inhaler.  Push fluids, follow-up with PCP.  Stop smoking.  If you develop chest pain, palpitations or worsening symptoms go to Er for further evaluation.     ED Prescriptions     Medication Sig Dispense Auth. Provider   methylPREDNISolone (MEDROL  DOSEPAK) 4 MG TBPK tablet Take as directed 21 tablet Wyman Meschke, NP   amoxicillin -clavulanate (AUGMENTIN ) 875-125 MG tablet Take 1 tablet by mouth every 12 (twelve) hours. 14 tablet Geeta Dworkin, NP   benzonatate  (TESSALON ) 100 MG capsule Take 1 capsule (100 mg total) by mouth every 8 (eight) hours. 21 capsule Lekeith Wulf, NP      PDMP not reviewed this encounter.   Aminta Loose, NP 12/21/23 1436

## 2023-12-21 NOTE — ED Triage Notes (Signed)
 Pt c/o cough, congestion, sob, and thick mucous.  Pt has COPD. She started smoking again about 7days ago

## 2023-12-21 NOTE — Discharge Instructions (Addendum)
 Take meds as directed(Augmentin , medrol dose pk, tessalon ), pt has inhaler.  Push fluids, follow-up with PCP.  Stop smoking.  If you develop chest pain, palpitations or worsening symptoms go to Er for further evaluation.

## 2023-12-23 ENCOUNTER — Other Ambulatory Visit: Payer: Self-pay | Admitting: Family

## 2023-12-23 DIAGNOSIS — J069 Acute upper respiratory infection, unspecified: Secondary | ICD-10-CM

## 2023-12-23 NOTE — Telephone Encounter (Signed)
 Copied from CRM [id number]. Topic: Clinical - Medication Refill >> Dec 23, 2023  4:15 PM Carla L wrote: Medication: albuterol  (VENTOLIN  HFA) 108 (90 Base) MCG/ACT inhaler Patient just had this refilled last week, however her child got a hold of the medication and clicked it into the air until there was no more. Patient requesting for refill to be sent in, patient aware insurance will not cover it again as it is too soon and is okay with paying out of pocket. Patient was treated for Viral URI and needing inhaler.   Patient also requesting nebulizing solution ipratropium bromide 0.5mg /3mg   Has the patient contacted their pharmacy? No  This is the patient's preferred pharmacy:  [facility] [address], [place] - [address] [place] [address] Phone: [phone] Fax: [phone]  Is this the correct pharmacy for this prescription? Yes  Has the prescription been filled recently? Yes  Is the patient out of the medication? Yes  Has the patient been seen for an appointment in the last year OR does the patient have an upcoming appointment? Yes  Can we respond through MyChart? Yes  Agent: Please be advised that Rx refills may take up to 3 business days. We ask that you follow-up with your pharmacy.

## 2023-12-24 NOTE — Telephone Encounter (Signed)
 Requested medication (s) are due for refill today: Yes  Requested medication (s) are on the active medication list: Yes  Last refill:  12/16/23  Future visit scheduled: Yes  Notes to clinic:  Unable to refill per protocol, last refill by another provider. See notes, willing to pay out of pocket      Requested Prescriptions  Pending Prescriptions Disp Refills   albuterol  (VENTOLIN  HFA) 108 (90 Base) MCG/ACT inhaler 8 g 0    Sig: Inhale 1-2 puffs into the lungs every 6 (six) hours as needed.     Pulmonology:  Beta Agonists 2 Passed - 12/24/2023 10:39 PM      Passed - Last BP in normal range    BP Readings from Last 1 Encounters:  12/21/23 128/74         Passed - Last Heart Rate in normal range    Pulse Readings from Last 1 Encounters:  12/21/23 63         Passed - Valid encounter within last 12 months    Recent Outpatient Visits           2 months ago Leg swelling   [facility], [name] J, NP   3 months ago Lower urinary tract symptoms (LUTS)   [facility], [name] J, NP   5 months ago Lower urinary tract symptoms (LUTS)   [facility], [name] J, NP   5 months ago Panniculus   [facility], [place], NP   10 months ago Liver mass   [facility], Greig PARAS, NP

## 2023-12-27 MED ORDER — ALBUTEROL SULFATE HFA 108 (90 BASE) MCG/ACT IN AERS: 1.0000 | INHALATION_SPRAY | Freq: Four times a day (QID) | RESPIRATORY_TRACT | 2 refills | Status: DC | PRN

## 2023-12-27 NOTE — Telephone Encounter (Signed)
 Complete

## 2023-12-30 ENCOUNTER — Other Ambulatory Visit: Payer: Self-pay | Admitting: Vascular Surgery

## 2023-12-30 DIAGNOSIS — M7989 Other specified soft tissue disorders: Secondary | ICD-10-CM

## 2024-01-03 ENCOUNTER — Ambulatory Visit: Payer: Self-pay

## 2024-01-19 ENCOUNTER — Emergency Department (HOSPITAL_BASED_OUTPATIENT_CLINIC_OR_DEPARTMENT_OTHER)

## 2024-01-19 ENCOUNTER — Other Ambulatory Visit: Payer: Self-pay

## 2024-01-19 ENCOUNTER — Emergency Department (HOSPITAL_BASED_OUTPATIENT_CLINIC_OR_DEPARTMENT_OTHER)
Admission: EM | Admit: 2024-01-19 | Discharge: 2024-01-19 | Disposition: A | Discharge status: Home / Self Care | Attending: Emergency Medicine | Admitting: Emergency Medicine

## 2024-01-19 DIAGNOSIS — K2900 Acute gastritis without bleeding: Secondary | ICD-10-CM

## 2024-01-19 DIAGNOSIS — R1013 Epigastric pain: Secondary | ICD-10-CM | POA: Diagnosis present

## 2024-01-19 LAB — URINALYSIS, ROUTINE W REFLEX MICROSCOPIC
Bacteria, UA: NONE SEEN
Bilirubin Urine: NEGATIVE
Glucose, UA: NEGATIVE mg/dL
Hgb urine dipstick: NEGATIVE
Ketones, ur: NEGATIVE mg/dL
Leukocytes,Ua: NEGATIVE
Nitrite: NEGATIVE
Protein, ur: NEGATIVE mg/dL
Specific Gravity, Urine: 1.021 (ref 1.005–1.030)
pH: 7 (ref 5.0–8.0)

## 2024-01-19 LAB — CBC
HCT: 35.1 % — ABNORMAL LOW (ref 36.0–46.0)
Hemoglobin: 10.9 g/dL — ABNORMAL LOW (ref 12.0–15.0)
MCH: 21.9 pg — ABNORMAL LOW (ref 26.0–34.0)
MCHC: 31.1 g/dL (ref 30.0–36.0)
MCV: 70.6 fL — ABNORMAL LOW (ref 80.0–100.0)
Platelets: 347 K/uL (ref 150–400)
RBC: 4.97 MIL/uL (ref 3.87–5.11)
RDW: 21.2 % — ABNORMAL HIGH (ref 11.5–15.5)
WBC: 5.9 K/uL (ref 4.0–10.5)
nRBC: 0 % (ref 0.0–0.2)

## 2024-01-19 LAB — COMPREHENSIVE METABOLIC PANEL WITH GFR
ALT: 12 U/L (ref 0–44)
AST: 19 U/L (ref 15–41)
Albumin: 4 g/dL (ref 3.5–5.0)
Alkaline Phosphatase: 93 U/L (ref 38–126)
Anion gap: 11 (ref 5–15)
BUN: 11 mg/dL (ref 6–20)
CO2: 27 mmol/L (ref 22–32)
Calcium: 10.1 mg/dL (ref 8.9–10.3)
Chloride: 100 mmol/L (ref 98–111)
Creatinine, Ser: 0.73 mg/dL (ref 0.44–1.00)
GFR, Estimated: >60 mL/min (ref >60)
Glucose, Bld: 98 mg/dL (ref 70–99)
Potassium: 4.1 mmol/L (ref 3.5–5.1)
Sodium: 138 mmol/L (ref 135–145)
Total Bilirubin: 0.4 mg/dL (ref 0.0–1.2)
Total Protein: 7.5 g/dL (ref 6.5–8.1)

## 2024-01-19 LAB — PREGNANCY, URINE: Preg Test, Ur: NEGATIVE

## 2024-01-19 LAB — LIPASE, BLOOD: Lipase: 17 U/L (ref 11–51)

## 2024-01-19 MED ORDER — PANTOPRAZOLE SODIUM 40 MG IV SOLR
40.0000 mg | Freq: Once | INTRAVENOUS | Status: AC
  Administered 2024-01-19: 40 mg via INTRAVENOUS
  Filled 2024-01-19: qty 10

## 2024-01-19 MED ORDER — IOHEXOL 300 MG/ML  SOLN
100.0000 mL | Freq: Once | INTRAMUSCULAR | Status: AC | PRN
  Administered 2024-01-19: 100 mL via INTRAVENOUS

## 2024-01-19 MED ORDER — SUCRALFATE 1 G PO TABS: 1.0000 g | ORAL_TABLET | Freq: Four times a day (QID) | ORAL | 0 refills | Status: AC

## 2024-01-19 MED ORDER — METOCLOPRAMIDE HCL 5 MG/ML IJ SOLN
10.0000 mg | Freq: Once | INTRAMUSCULAR | Status: DC
  Filled 2024-01-19: qty 2

## 2024-01-19 MED ORDER — SODIUM CHLORIDE 0.9 % IV SOLN
25.0000 mg | Freq: Four times a day (QID) | INTRAVENOUS | Status: DC | PRN
  Filled 2024-01-19: qty 1

## 2024-01-19 MED ORDER — LACTATED RINGERS IV SOLN: INTRAVENOUS | Status: DC

## 2024-01-19 MED ORDER — LACTATED RINGERS IV BOLUS
1000.0000 mL | Freq: Once | INTRAVENOUS | Status: AC
  Administered 2024-01-19: 1000 mL via INTRAVENOUS

## 2024-01-19 MED ORDER — PROMETHAZINE HCL 25 MG/ML IJ SOLN
INTRAMUSCULAR | Status: AC
  Administered 2024-01-19: 25 mg
  Filled 2024-01-19: qty 1

## 2024-01-19 MED ORDER — MORPHINE SULFATE (PF) 4 MG/ML IV SOLN
6.0000 mg | Freq: Once | INTRAVENOUS | Status: AC
  Administered 2024-01-19: 6 mg via INTRAVENOUS
  Filled 2024-01-19: qty 2

## 2024-01-19 MED ORDER — PANTOPRAZOLE SODIUM 40 MG PO TBEC: 40.0000 mg | DELAYED_RELEASE_TABLET | Freq: Every day | ORAL | 0 refills | Status: AC

## 2024-01-19 NOTE — ED Notes (Signed)
 Patient transported to CT

## 2024-01-19 NOTE — ED Provider Notes (Signed)
 [place] EMERGENCY DEPARTMENT AT [facility] Provider Note   CSN: [id number] Arrival date & time: 01/19/24  9243     Patient presents with: No chief complaint on file.   Kathy Burns is a 38 y.o. female.   38 year old female with history of gastric bypass as well as cholecystectomy presents with several days of epigastric abdominal pain.  History of gastric bypass surgery.  She has had nausea but no vomiting.  Denies any fever or chills.  No urinary symptoms.  No vaginal bleeding or discharge.  Last bowel movement was about 2 days ago and states that she is able to pass gas.  Denies any abdominal distention.  Pain is worse with certain positions.  Does admit to drinking alcohol daily approximately 1 to 2 glasses of wine.  No treatment used prior to arrival       Prior to Admission medications   Medication Sig Start Date End Date Taking? Authorizing Provider  albuterol  (VENTOLIN  HFA) 108 (90 Base) MCG/ACT inhaler Inhale 1-2 puffs into the lungs every 6 (six) hours as needed. 12/27/23   Jaycee Greig PARAS, NP  amoxicillin -clavulanate (AUGMENTIN ) 875-125 MG tablet Take 1 tablet by mouth every 12 (twelve) hours. 12/21/23   Defelice, Jeanette, NP  benzonatate  (TESSALON ) 100 MG capsule Take 1 capsule (100 mg total) by mouth every 8 (eight) hours. 12/21/23   Defelice, Jeanette, NP  brompheniramine-pseudoephedrine-DM 30-2-10 MG/5ML syrup Take 5 mLs by mouth 4 (four) times daily as needed. 12/16/23   Vivienne Delon HERO, [name]  fluconazole  (DIFLUCAN ) 150 MG tablet Take 1 tablet (150 mg total) by mouth once as needed for up to 2 doses (take one pill on day 1, and the second pill 3 days later). 11/20/23   Rising, Rebecca, [name]  fluticasone (FLONASE) 50 MCG/ACT nasal spray Place 2 sprays into both nostrils daily. 12/16/23   Vivienne Delon HERO, [name]  IBUPROFEN PO Take by mouth.    [provider]  lidocaine  (LIDODERM ) 5 % Place 1 patch onto the skin daily. Remove & Discard patch  within 12 hours or as directed by MD 12/08/23   Mecum, Erin E, [name]  methylPREDNISolone (MEDROL DOSEPAK) 4 MG TBPK tablet Take as directed 12/21/23   Defelice, Rilla, NP  nitrofurantoin  (MACRODANTIN ) 100 MG capsule Take 100 mg by mouth daily. 11/16/23   [provider]  ondansetron  (ZOFRAN -ODT) 4 MG disintegrating tablet Take 1 tablet (4 mg total) by mouth every 8 (eight) hours as needed for nausea or vomiting. 10/26/23   Sofia, [name], [name]    Allergies: Brassica oleracea, Tylenol  [acetaminophen ], and Codeine    Review of Systems  All other systems reviewed and are negative.   Updated Vital Signs There were no vitals taken for this visit.  Physical Exam Vitals and nursing note reviewed.  Constitutional:      General: She is not in acute distress.    Appearance: Normal appearance. She is well-developed. She is not toxic-appearing.  HENT:     Head: Normocephalic and atraumatic.  Eyes:     General: Lids are normal.     Conjunctiva/sclera: Conjunctivae normal.     Pupils: Pupils are equal, round, and reactive to light.  Neck:     Thyroid: No thyroid mass.     Trachea: No tracheal deviation.  Cardiovascular:     Rate and Rhythm: Normal rate and regular rhythm.     Heart sounds: Normal heart sounds. No murmur heard.    No gallop.  Pulmonary:  Effort: Pulmonary effort is normal. No respiratory distress.     Breath sounds: Normal breath sounds. No stridor. No decreased breath sounds, wheezing, rhonchi or rales.  Abdominal:     General: There is no distension.     Palpations: Abdomen is soft.     Tenderness: There is abdominal tenderness in the epigastric area. There is guarding. There is no rebound.  Musculoskeletal:        General: No tenderness. Normal range of motion.     Cervical back: Normal range of motion and neck supple.  Skin:    General: Skin is warm and dry.     Findings: No abrasion or rash.  Neurological:     Mental Status: She is alert and oriented  to person, place, and time. Mental status is at baseline.     GCS: GCS eye subscore is 4. GCS verbal subscore is 5. GCS motor subscore is 6.     Cranial Nerves: No cranial nerve deficit.     Sensory: No sensory deficit.     Motor: Motor function is intact.  Psychiatric:        Attention and Perception: Attention normal.        Speech: Speech normal.        Behavior: Behavior normal.     (all labs ordered are listed, but only abnormal results are displayed) Labs Reviewed  LIPASE, BLOOD  COMPREHENSIVE METABOLIC PANEL WITH GFR  CBC  URINALYSIS, ROUTINE W REFLEX MICROSCOPIC  PREGNANCY, URINE    EKG: None  Radiology: No results found.   Procedures   Medications Ordered in the ED  lactated ringers  bolus 1,000 mL (has no administration in time range)  lactated ringers  infusion (has no administration in time range)  metoCLOPramide  (REGLAN ) injection 10 mg (has no administration in time range)                                    Medical Decision Making Amount and/or Complexity of Data Reviewed Labs: ordered. Radiology: ordered.  Risk Prescription drug management.   Patient treated here with IV fluids, antiemetics and pain medication.  Concern for possible obstruction.  Labs are reassuring.  Abdominal CT shows findings concerning for a focal gastritis or jejunitis versus an inflamed ulcer.  No evidence of bowel obstruction.  Patient's pain is controlled at this time.  Will place patient on Carafate  and PPI.  Will give GI referral.  Return precautions given     Final diagnoses:  None    ED Discharge Orders     None          Dasie Faden, MD 01/19/24 1136

## 2024-01-25 ENCOUNTER — Ambulatory Visit

## 2024-01-25 VITALS — BP 100/66 | HR 64 | Temp 98.2°F | Ht 65.5 in | Wt 125.1 lb

## 2024-01-25 DIAGNOSIS — F3177 Bipolar disorder, in partial remission, most recent episode mixed: Secondary | ICD-10-CM | POA: Diagnosis not present

## 2024-01-25 DIAGNOSIS — K2970 Gastritis, unspecified, without bleeding: Secondary | ICD-10-CM | POA: Insufficient documentation

## 2024-01-25 DIAGNOSIS — B182 Chronic viral hepatitis C: Secondary | ICD-10-CM

## 2024-01-25 DIAGNOSIS — K219 Gastro-esophageal reflux disease without esophagitis: Secondary | ICD-10-CM

## 2024-01-25 DIAGNOSIS — F1911 Other psychoactive substance abuse, in remission: Secondary | ICD-10-CM

## 2024-01-25 DIAGNOSIS — G8929 Other chronic pain: Secondary | ICD-10-CM

## 2024-01-25 DIAGNOSIS — G4733 Obstructive sleep apnea (adult) (pediatric): Secondary | ICD-10-CM | POA: Diagnosis not present

## 2024-01-25 DIAGNOSIS — Z1231 Encounter for screening mammogram for malignant neoplasm of breast: Secondary | ICD-10-CM

## 2024-01-25 DIAGNOSIS — Z23 Encounter for immunization: Secondary | ICD-10-CM

## 2024-01-25 DIAGNOSIS — J449 Chronic obstructive pulmonary disease, unspecified: Secondary | ICD-10-CM

## 2024-01-25 DIAGNOSIS — Z9884 Bariatric surgery status: Secondary | ICD-10-CM

## 2024-01-25 DIAGNOSIS — J069 Acute upper respiratory infection, unspecified: Secondary | ICD-10-CM | POA: Diagnosis not present

## 2024-01-25 DIAGNOSIS — Z130 Encounter for screening for diseases of the blood and blood-forming organs and certain disorders involving the immune mechanism: Secondary | ICD-10-CM

## 2024-01-25 DIAGNOSIS — M7918 Myalgia, other site: Secondary | ICD-10-CM

## 2024-01-25 DIAGNOSIS — Z7689 Persons encountering health services in other specified circumstances: Secondary | ICD-10-CM

## 2024-01-25 DIAGNOSIS — K2900 Acute gastritis without bleeding: Secondary | ICD-10-CM

## 2024-01-25 MED ORDER — ALBUTEROL SULFATE HFA 108 (90 BASE) MCG/ACT IN AERS
1.0000 | INHALATION_SPRAY | Freq: Four times a day (QID) | RESPIRATORY_TRACT | 2 refills | Status: AC | PRN
Start: 2024-01-25 — End: ?

## 2024-01-25 MED ORDER — DULOXETINE HCL 20 MG PO CPEP: 20.0000 mg | ORAL_CAPSULE | Freq: Every day | ORAL | 3 refills | Status: AC

## 2024-01-25 NOTE — Assessment & Plan Note (Signed)
 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 Follows with [facility].  Continue abstinence from illicit substances.

## 2024-01-25 NOTE — Assessment & Plan Note (Signed)
 Significant weight loss post-bariatric surgery. Weight was previously stable in the 160's but patient has gone down ~30-40 lbs over the last several weeks unintentionally. Concerns about fragility and safety.  - Ordered full thyroid panel. - Ordered blood counts, kidney and liver function, cholesterol panel, A1c, vitamin D, B12, folate, and iron levels. - Referred to nutritionist in [facility] for weight management. - Advised to also discuss with GI. Patient already scheduled for endoscopy and colonoscopy to rule out malignancy as a cause.

## 2024-01-25 NOTE — Assessment & Plan Note (Signed)
 Will restart Cymbalta  at 20 mg per patient request. Advised to increase to 40 mg after 1 week and the ultimately titrate up to 60 mg when comfortable. Once stable on 60 mg dose, will then plan to restart amitriptyline for further pain management and aid sleep.

## 2024-01-25 NOTE — Assessment & Plan Note (Signed)
 Continue sulcrafate QID and pantoprazole  40 mg. Symptoms improving. Now following with [facility] and has scheduled endoscopy and colonoscopy. Advised to abstain from ETOH and NSAIDs to prevent worsening symptoms.

## 2024-01-25 NOTE — Assessment & Plan Note (Signed)
 COPD is well-managed with recent weight loss. Has not had an exacerbation in quite some time. Previously had stopped smoking for a period of time but recently restarted smoking 1 ppd.  - Encouraged smoking cessation. - Offered nicotine  patches if needed. - Refilled albuterol  inhaler. - Patient is ready to quit. Declines medication assistance at this time.  - If breathing worsens or if she continues to smoke, will consider adding triple therapy inhaler.

## 2024-01-25 NOTE — Assessment & Plan Note (Addendum)
 Stable and currently on no medications. Restarting Cymbalta  per patient request as she reported that when she previously took this medication in combination with amitriptyline, she had reduction in joint pain and improved sleep. Once Cymbalta  dose is titrated up to 60 mg, will restart amitriptyline at that time.

## 2024-01-25 NOTE — Assessment & Plan Note (Signed)
 Not currently on CPAP or BiPAP. Stopped using after significant weight loss s/p RNY Gastric Bypass in 2023. Will cont to monitor.

## 2024-01-25 NOTE — Assessment & Plan Note (Signed)
 Pantoprazole  40 mg daily. Now following with GI and has scheduled endoscopy and colonoscopy for further evaluation.

## 2024-01-25 NOTE — Assessment & Plan Note (Signed)
 Hep C with positive antibody and undetectable viral load count. LFTs stable and within normal range.

## 2024-01-25 NOTE — Patient Instructions (Signed)
 VISIT SUMMARY: Today, we addressed several of your health concerns, including your recent weight loss, COPD, and other ongoing conditions. We administered a flu shot, ordered various tests, and discussed lifestyle changes to improve your overall health.  YOUR PLAN: UNINTENTIONAL WEIGHT LOSS AND POST-BARIATRIC SURGERY SYMPTOMS: You have lost 40 pounds over the past three months following gastric bypass surgery and are feeling fragile and unsafe. -Ordered a full thyroid panel, blood counts, kidney and liver function tests, cholesterol panel, A1c, vitamin D, B12, folate, and iron levels. -Referred you to a nutritionist in [place] for weight management.  COPD, STABLE, WITH ONGOING TOBACCO USE: Your COPD is well-managed, but you have resumed smoking. -Encouraged you to quit smoking and offered nicotine  patches if needed. -Refilled your albuterol  inhaler.  CHRONIC/RECURRENT URINARY TRACT INFECTIONS, ON SUPPRESSIVE THERAPY: You have a history of frequent urinary tract infections and are on trimethoprim  for prevention. -Continue taking trimethoprim  100 mg daily.  GASTROESOPHAGEAL REFLUX DISEASE WITH RECENT GASTRITIS AND PEPTIC ULCER: You have GERD and were recently diagnosed with gastritis and a peptic ulcer. -Continue taking Protonix  40 mg daily. -Continue taking sucralfate  four times daily.  BLOOD IN STOOL, PENDING GI EVALUATION: You have reported blood in your stool and have a colonoscopy and endoscopy scheduled. -Proceed with your scheduled colonoscopy and endoscopy on January 6th.  ALCOHOL USE, DAILY: You consume a bottle of wine daily and acknowledge the need to reduce your intake. -Encouraged you to reduce and eventually stop alcohol use.  BIPOLAR DISORDER, STABLE: Your bipolar disorder is stable, and you are managing your life to avoid stressors. -Continue your current management without psychiatric follow-up.  MUSCULOSKELETAL PAIN, CHRONIC: You have chronic pain and are currently  taking methadone. -Started you on duloxetine  20 mg daily for 7 days, then increase to 40 mg, and eventually to 60 mg as tolerated. -Will consider adding amitriptyline after you reach 60 mg of duloxetine .  LOW BLOOD PRESSURE (HYPOTENSION), SYMPTOMATIC: You have been experiencing dizziness and blurry vision upon standing due to low blood pressure. -Ordered blood work to assess for underlying causes of your low blood pressure.  GENERAL HEALTH MAINTENANCE: Discussed general health maintenance including mammogram and HPV vaccination. -Administered flu vaccine today. -Ordered a screening mammogram.  If you have any problems before your next visit feel free to message me via MyChart (minor issues or questions) or call the office, otherwise you may reach out to schedule an office visit.  Thank you! Saddie Sacks, [name]

## 2024-01-25 NOTE — Progress Notes (Signed)
 New Patient Office Visit  Subjective    Patient ID: Kathy Burns, female    DOB: June 26, 1985  Age: 38 y.o. MRN: [id number]  CC:  Chief Complaint  Patient presents with   New Patient (Initial Visit)    History of Present Illness   Kathy Burns is a 38 year old female with COPD and recent significant weight loss who presents for establishment of care and medication review. She is a previous patient of Greig Drones, NP at [facility]   Weight loss and post-bariatric surgery symptoms - Reports that she lost 40 pounds over the past three months. She is s/p Roux en Y Gastric Bypass performed in 2023.  - Feels fragile due to rapid weight loss - Experiences dizziness when standing up quickly - Reports that she is currently doing protein supplements to maintain her weight, but is requesting referral to a nutritionist for further counseling.   Chronic obstructive pulmonary disease (copd) and tobacco use - History of COPD. Breathing improved significantly after weight loss s/p gastric bypass - Recently resumed smoking after a period of cessation. Currently smoking 1 ppd. Ready to quit. Declines medication assistance with quitting at this time.  - Uses albuterol  inhaler as needed  Restless leg syndrome and sleep disturbances - History of restless leg syndrome not currently being treated  - History of sleep apnea, no longer using CPAP due to symptom improvement  Gastrointestinal symptoms and history - History of GERD - Currently taking sucralfate  four times daily and pantoprazole  40 mg once daily - History of stomach ulcers recently diagnosed in ED last week.  - Consumes a bottle of wine daily, acknowledges need to reduce intake due to ulcers - Reports blood in stool, bright red in color - Is following with [facility]  - Colonoscopy and endoscopy scheduled for further evaluation  Urinary tract infections - History of frequent urinary tract infections - Currently taking  trimethoprim  100 mg daily for prophylaxis - Under care of a urologist  Liver disease and hepatic lesion - History of chronic hepatitis C, now clear - History of fatty liver disease and a liver mass, now described as a lesion - Recent imaging and liver function tests normal  Chronic pain and medication management - History of musculoskeletal pain for suspected fibromyalgia  - Previously used duloxetine  and amitriptyline for pain, interested in restarting these medications - Currently taking methadone - Getting this through [facility] d/t history of heroin abuse   Bipolar disorder and psychiatric history - History of bipolar disorder - Not currently followed by a psychiatrist - Mentally reports that she is doing well without medication and symptoms of depression and anxiety are well controlled      Screenings:  Colon Cancer: Colonoscopy scheduled due to BRBPR Lung Cancer: NA due to age  Breast Cancer: Mammogram ordered today  Diabetes: Checking A1c with labs  HLD: Checking lipid panel with labs  The ASCVD Risk score (Arnett DK, et al., 2019) failed to calculate for the following reasons:   The valid total cholesterol range is 130 to 320 mg/dL  Outpatient Encounter Medications as of 01/25/2024  Medication Sig   DULoxetine  (CYMBALTA ) 20 MG capsule Take 1 capsule (20 mg total) by mouth daily.   methadone (DOLOPHINE) 10 MG tablet Take 10 mg by mouth every 8 (eight) hours.   trimethoprim  (TRIMPEX ) 100 MG tablet Take 100 mg by mouth daily.   albuterol  (VENTOLIN  HFA) 108 (90 Base) MCG/ACT inhaler Inhale 1-2 puffs into the lungs  every 6 (six) hours as needed.   lidocaine  (LIDODERM ) 5 % Place 1 patch onto the skin daily. Remove & Discard patch within 12 hours or as directed by MD   ondansetron  (ZOFRAN -ODT) 4 MG disintegrating tablet Take 1 tablet (4 mg total) by mouth every 8 (eight) hours as needed for nausea or vomiting.   pantoprazole  (PROTONIX ) 40 MG tablet Take 1 tablet (40  mg total) by mouth daily.   sucralfate  (CARAFATE ) 1 g tablet Take 1 tablet (1 g total) by mouth 4 (four) times daily.   [DISCONTINUED] albuterol  (VENTOLIN  HFA) 108 (90 Base) MCG/ACT inhaler Inhale 1-2 puffs into the lungs every 6 (six) hours as needed.   [DISCONTINUED] amoxicillin -clavulanate (AUGMENTIN ) 875-125 MG tablet Take 1 tablet by mouth every 12 (twelve) hours.   [DISCONTINUED] benzonatate  (TESSALON ) 100 MG capsule Take 1 capsule (100 mg total) by mouth every 8 (eight) hours.   [DISCONTINUED] brompheniramine-pseudoephedrine-DM 30-2-10 MG/5ML syrup Take 5 mLs by mouth 4 (four) times daily as needed.   [DISCONTINUED] fluconazole  (DIFLUCAN ) 150 MG tablet Take 1 tablet (150 mg total) by mouth once as needed for up to 2 doses (take one pill on day 1, and the second pill 3 days later).   [DISCONTINUED] fluticasone  (FLONASE ) 50 MCG/ACT nasal spray Place 2 sprays into both nostrils daily.   [DISCONTINUED] IBUPROFEN PO Take by mouth.   [DISCONTINUED] methylPREDNISolone  (MEDROL  DOSEPAK) 4 MG TBPK tablet Take as directed   [DISCONTINUED] nitrofurantoin  (MACRODANTIN ) 100 MG capsule Take 100 mg by mouth daily.   No facility-administered encounter medications on file as of 01/25/2024.    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 02/14/2019   Last Assessment & Plan:    Patient is a 38 yo G4P3003 at [redacted]w[redacted]d with a pregnancy complicated by multiple fetal anomalies, potentially  compromising the airway, admitted for extended monitoring and steroids in the setting of BPP 6/10 on day of admission at the MFM office       -  BMZ#1 on 12/15 at 2203, BMZ#2 on 12/16 at 1009   - FHT: category I throughout inpatient admission   - 12/16: BPP 8/8     Past Surgical History:  Procedure Laterality Date   CESAREAN SECTION     x 4   CHOLECYSTECTOMY     GASTRIC BYPASS     IR IMAGE GUIDED DRAINAGE BY PERCUTANEOUS CATHETER  11/19/2023   PANNICULECTOMY N/A 10/11/2023   Procedure: PANNICULECTOMY;  Surgeon: Waddell Leonce NOVAK, MD;  Location: [facility];  Service: Plastics;  Laterality: N/A;   REMOVAL OF EAR TUBE     TEMPOROMANDIBULAR JOINT ARTHROPLASTY     TONSILLECTOMY AND ADENOIDECTOMY     TUBAL LIGATION      Family History  Problem Relation Age of Onset   Ovarian cancer Mother    Heart disease Maternal Grandmother    Heart disease Maternal Grandfather  Breast cancer Paternal Grandmother    Esophageal cancer Paternal Grandfather    Hypertension Other    Diabetes Other    Cancer Other    Stroke Other    Breast cancer Paternal Aunt     Social History   Socioeconomic History   Marital status: Legally Separated    Spouse name: Not on file   Number of children: 4   Years of education: Not on file   Highest education level: High school graduate  Occupational History   Occupation: Surveyor, mining  Tobacco Use   Smoking status: Every Day    Current packs/day: 0.00    Types: Cigarettes    Last attempt to quit: 2023    Years since quitting: 2.9    Passive exposure: Current   Smokeless tobacco: Never  Vaping Use   Vaping status: Every Day   Substances: Nicotine , Flavoring  Substance and Sexual Activity   Alcohol use: Yes    Comment: a pint or more a day -beer and liquor   Drug use: Not Currently    Types: Marijuana, Cocaine, Heroin    Comment: 02/14/2013 last use, heroine last use 11/21/14   Sexual activity: Yes    Birth control/protection:  None  Other Topics Concern   Not on file  Social History Narrative   Not on file   Social Drivers of Health   Financial Resource Strain: Low Risk  (12/08/2022)   Overall Financial Resource Strain (CARDIA)    Difficulty of Paying Living Expenses: Not hard at all  Food Insecurity: No Food Insecurity (12/08/2022)   Hunger Vital Sign    Worried About Running Out of Food in the Last Year: Never true    Ran Out of Food in the Last Year: Never true  Transportation Needs: No Transportation Needs (12/08/2022)   PRAPARE - Administrator, Civil Service (Medical): No    Lack of Transportation (Non-Medical): No  Physical Activity: Sufficiently Active (12/08/2022)   Exercise Vital Sign    Days of Exercise per Week: 5 days    Minutes of Exercise per Session: 30 min  Stress: No Stress Concern Present (12/08/2022)   [employer] of Occupational Health - Occupational Stress Questionnaire    Feeling of Stress : Only a little  Social Connections: Unknown (12/15/2022)   Received from [facility]   Social Network    Social Network: Not on file  Recent Concern: Social Connections - Moderately Isolated (12/08/2022)   Social Connection and Isolation Panel    Frequency of Communication with Friends and Family: More than three times a week    Frequency of Social Gatherings with Friends and Family: More than three times a week    Attends Religious Services: More than 4 times per year    Active Member of [employer] or Organizations: No    Attends Banker Meetings: Never    Marital Status: Separated  Intimate Partner Violence: Unknown (12/15/2022)   Received from [facility]   HITS    Physically Hurt: Not on file    Insult or Talk Down To: Not on file    Threaten Physical Harm: Not on file    Scream or Curse: Not on file    ROS  Per HPI      Objective    BP 100/66   Pulse 64   Temp 98.2 F (36.8 C) (Oral)   Ht 5' 5.5 (1.664 m)   Wt 125 lb 1.9 oz (56.8 kg)   SpO2  100%   BMI 20.50 kg/m   Physical Exam Constitutional:      General: She is not in acute distress.    Appearance: Normal appearance.  Eyes:     Pupils: Pupils are equal, round, and reactive to light.  Cardiovascular:     Rate and Rhythm: Normal rate and regular rhythm.     Heart sounds: Normal heart sounds. No murmur heard.    No friction rub. No gallop.  Pulmonary:     Effort: Pulmonary effort is normal. No respiratory distress.     Breath sounds: Normal breath sounds.  Abdominal:     General: Bowel sounds are normal.  Musculoskeletal:        General: No swelling.     Cervical back: Neck supple.  Skin:    General: Skin is warm and dry.  Neurological:     General: No focal deficit present.     Mental Status: She is alert.  Psychiatric:        Mood and Affect: Mood normal.        Behavior: Behavior normal.        Thought Content: Thought content normal.          Assessment & Plan:   Encounter for vaccination -     Flu vaccine trivalent PF, 6mos and older(Flulaval,Afluria,Fluarix,Fluzone) -     HPV 9-valent vaccine,Recombinat  Viral URI with cough -     Albuterol  Sulfate HFA; Inhale 1-2 puffs into the lungs every 6 (six) hours as needed.  Dispense: 8 g; Refill: 2  Screening mammogram for breast cancer -     3D Screening Mammogram, Left and Right; Future  History of Roux-en-Y gastric bypass Assessment & Plan: Significant weight loss post-bariatric surgery. Weight was previously stable in the 160's but patient has gone down ~30-40 lbs over the last several weeks unintentionally. Concerns about fragility and safety.  - Ordered full thyroid panel. - Ordered blood counts, kidney and liver function, cholesterol panel, A1c, vitamin D, B12, folate, and iron levels. - Referred to nutritionist in [facility] for weight management. - Advised to also discuss with GI. Patient already scheduled for endoscopy and colonoscopy to rule out malignancy as a cause.    Orders: -      Amb ref to Medical Nutrition Therapy-MNT -     Iron, TIBC and Ferritin Panel; Future -     VITAMIN D 25 Hydroxy (Vit-D Deficiency, Fractures); Future -     B12 and Folate Panel; Future  Screening for endocrine, nutritional, metabolic and immunity disorder -     Thyroid Panel With TSH; Future -     CBC with Differential/Platelet; Future -     Iron, TIBC and Ferritin Panel; Future -     Lipid panel; Future -     Hemoglobin A1c; Future -     Comprehensive metabolic panel with GFR; Future -     VITAMIN D 25 Hydroxy (Vit-D Deficiency, Fractures); Future -     B12 and Folate Panel; Future  COPD, moderate (HCC) Assessment & Plan: COPD is well-managed with recent weight loss. Has not had an exacerbation in quite some time. Previously had stopped smoking for a period of time but recently restarted smoking 1 ppd.  - Encouraged smoking cessation. - Offered nicotine  patches if needed. - Refilled albuterol  inhaler. - Patient is ready to quit. Declines medication assistance at this time.  - If breathing worsens or if she continues to smoke, will consider adding triple therapy inhaler.  Chronic hepatitis C without hepatic coma (HCC) Assessment & Plan: Hep C with positive antibody and undetectable viral load count. LFTs stable and within normal range.    Bipolar disorder, in partial remission, most recent episode mixed [facility]) Assessment & Plan: Stable and currently on no medications. Restarting Cymbalta  per patient request as she reported that when she previously took this medication in combination with amitriptyline, she had reduction in joint pain and improved sleep. Once Cymbalta  dose is titrated up to 60 mg, will restart amitriptyline at that time.    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 Follows with [facility].  Continue abstinence from illicit substances.   OSA (obstructive sleep apnea) Assessment & Plan: Not  currently on CPAP or BiPAP. Stopped using after significant weight loss s/p RNY Gastric Bypass in 2023. Will cont to monitor.    Gastroesophageal reflux disease without esophagitis Assessment & Plan: Pantoprazole  40 mg daily. Now following with GI and has scheduled endoscopy and colonoscopy for further evaluation.    Acute gastritis, presence of bleeding unspecified, unspecified gastritis type Assessment & Plan: Continue sulcrafate QID and pantoprazole  40 mg. Symptoms improving. Now following with [facility] and has scheduled endoscopy and colonoscopy. Advised to abstain from ETOH and NSAIDs to prevent worsening symptoms.   Chronic musculoskeletal pain Assessment & Plan: Will restart Cymbalta  at 20 mg per patient request. Advised to increase to 40 mg after 1 week and the ultimately titrate up to 60 mg when comfortable. Once stable on 60 mg dose, will then plan to restart amitriptyline for further pain management and aid sleep.   Other orders -     DULoxetine  HCl; Take 1 capsule (20 mg total) by mouth daily.  Dispense: 90 capsule; Refill: 3     Return in about 3 months (around 04/26/2024) for Cymbalta  f/u / med check .   Saddie JULIANNA Sacks, [name]

## 2024-02-01 ENCOUNTER — Ambulatory Visit: Admitting: Family

## 2024-02-07 ENCOUNTER — Ambulatory Visit: Payer: Self-pay | Admitting: *Deleted

## 2024-02-07 ENCOUNTER — Other Ambulatory Visit: Payer: Self-pay

## 2024-02-07 ENCOUNTER — Other Ambulatory Visit

## 2024-02-07 DIAGNOSIS — Z130 Encounter for screening for diseases of the blood and blood-forming organs and certain disorders involving the immune mechanism: Secondary | ICD-10-CM

## 2024-02-07 DIAGNOSIS — Z9884 Bariatric surgery status: Secondary | ICD-10-CM

## 2024-02-07 NOTE — Telephone Encounter (Signed)
 FYI Only or Action Required?: FYI only for provider: appointment scheduled on 12/9.  Patient was last seen in primary care on 01/25/2024 by Gayle Saddie FALCON, [name].  Called Nurse Triage reporting Numbness.  Symptoms began several days ago.  Interventions attempted: Nothing.  Symptoms are: unchanged.  Triage Disposition: See PCP When Office is Open (Within 3 Days)  Patient/caregiver understands and will follow disposition?: Yes  Copied from CRM #[id number]. Topic: Clinical - Red Word Triage >> Feb 07, 2024  8:13 AM Myrick T wrote: Red Word that prompted transfer to Nurse Triage: patient stated she has numbness in her hands that sometimes go up her arm. The pain comes and goes but she thinks its neuropathy. Reason for Disposition  [1] MODERATE pain (e.g., interferes with normal activities) AND [2] present > 3 days  Answer Assessment - Initial Assessment Questions 1. ONSET: When did the pain start?     Friday- woke patient up from sleep 2. LOCATION: Where is the pain located?     L hand worse, R hand gets numb too 3. PAIN: How bad is the pain? (Scale 1-10; or mild, moderate, severe)     1-2/10-now,  comes and goes -worse at night-can be more painful 4. WORK OR EXERCISE: Has there been any recent work or exercise that involved this part (i.e., hand or wrist) of the body?     No 5. CAUSE: What do you think is causing the pain?     Unsure- patient has previous drug use, believes neuropathy 6. AGGRAVATING FACTORS: What makes the pain worse? (e.g., using computer)     no 7. OTHER SYMPTOMS: Do you have any other symptoms? (e.g., fever, neck pain, numbness or tingling, rash, swelling)     Recent diagnosis-Stomach ulcers, ulcers in nose 8. PREGNANCY: Is there any chance you are pregnant? When was your last menstrual period?     No- tubes removed  Protocols used: Hand Pain-A-AH

## 2024-02-08 ENCOUNTER — Ambulatory Visit: Admitting: Family Medicine

## 2024-02-08 ENCOUNTER — Encounter: Payer: Self-pay | Admitting: Family Medicine

## 2024-02-08 VITALS — BP 104/66 | HR 66 | Ht 65.5 in | Wt 126.1 lb

## 2024-02-08 DIAGNOSIS — R7989 Other specified abnormal findings of blood chemistry: Secondary | ICD-10-CM | POA: Insufficient documentation

## 2024-02-08 DIAGNOSIS — D509 Iron deficiency anemia, unspecified: Secondary | ICD-10-CM | POA: Insufficient documentation

## 2024-02-08 DIAGNOSIS — R202 Paresthesia of skin: Secondary | ICD-10-CM | POA: Insufficient documentation

## 2024-02-08 DIAGNOSIS — B009 Herpesviral infection, unspecified: Secondary | ICD-10-CM

## 2024-02-08 LAB — CBC WITH DIFFERENTIAL/PLATELET
Basophils Absolute: 0.1 x10E3/uL (ref 0.0–0.2)
Basos: 1 %
EOS (ABSOLUTE): 0.1 x10E3/uL (ref 0.0–0.4)
Eos: 1 %
Hematocrit: 34.6 % (ref 34.0–46.6)
Hemoglobin: 10.2 g/dL — ABNORMAL LOW (ref 11.1–15.9)
Immature Grans (Abs): 0 x10E3/uL (ref 0.0–0.1)
Immature Granulocytes: 0 %
Lymphocytes Absolute: 1.4 x10E3/uL (ref 0.7–3.1)
Lymphs: 23 %
MCH: 22 pg — ABNORMAL LOW (ref 26.6–33.0)
MCHC: 29.5 g/dL — ABNORMAL LOW (ref 31.5–35.7)
MCV: 75 fL — ABNORMAL LOW (ref 79–97)
Monocytes Absolute: 0.4 x10E3/uL (ref 0.1–0.9)
Monocytes: 6 %
Neutrophils Absolute: 4.3 x10E3/uL (ref 1.4–7.0)
Neutrophils: 69 %
Platelets: 337 x10E3/uL (ref 150–450)
RBC: 4.64 x10E6/uL (ref 3.77–5.28)
RDW: 19 % — ABNORMAL HIGH (ref 11.7–15.4)
WBC: 6.1 x10E3/uL (ref 3.4–10.8)

## 2024-02-08 LAB — COMPREHENSIVE METABOLIC PANEL WITH GFR
ALT: 15 IU/L (ref 0–32)
AST: 20 IU/L (ref 0–40)
Albumin: 4 g/dL (ref 3.9–4.9)
Alkaline Phosphatase: 69 IU/L (ref 41–116)
BUN/Creatinine Ratio: 14 (ref 9–23)
BUN: 11 mg/dL (ref 6–24)
Bilirubin Total: 0.4 mg/dL (ref 0.0–1.2)
CO2: 24 mmol/L (ref 20–29)
Calcium: 9.3 mg/dL (ref 8.7–10.2)
Chloride: 103 mmol/L (ref 96–106)
Creatinine, Ser: 0.77 mg/dL (ref 0.57–1.00)
Globulin, Total: 2.5 g/dL (ref 1.5–4.5)
Glucose: 142 mg/dL — ABNORMAL HIGH (ref 70–99)
Potassium: 4.1 mmol/L (ref 3.5–5.2)
Sodium: 139 mmol/L (ref 134–144)
Total Protein: 6.5 g/dL (ref 6.0–8.5)
eGFR: 100 mL/min/1.73 (ref >59)

## 2024-02-08 LAB — LIPID PANEL
Chol/HDL Ratio: 1.9 ratio (ref 0.0–4.4)
Cholesterol, Total: 113 mg/dL (ref 100–199)
HDL: 61 mg/dL (ref >39)
LDL Chol Calc (NIH): 39 mg/dL (ref 0–99)
Triglycerides: 54 mg/dL (ref 0–149)
VLDL Cholesterol Cal: 13 mg/dL (ref 5–40)

## 2024-02-08 LAB — THYROID PANEL WITH TSH
Free Thyroxine Index: 1.6 (ref 1.2–4.9)
T3 Uptake Ratio: 28 % (ref 24–39)
T4, Total: 5.8 ug/dL (ref 4.5–12.0)
TSH: 0.399 u[IU]/mL — ABNORMAL LOW (ref 0.450–4.500)

## 2024-02-08 LAB — B12 AND FOLATE PANEL
Folate: 8.4 ng/mL (ref >3.0)
Vitamin B-12: 487 pg/mL (ref 232–1245)

## 2024-02-08 LAB — HEMOGLOBIN A1C
Est. average glucose Bld gHb Est-mCnc: 114 mg/dL
Hgb A1c MFr Bld: 5.6 % (ref 4.8–5.6)

## 2024-02-08 LAB — VITAMIN D 25 HYDROXY (VIT D DEFICIENCY, FRACTURES): Vit D, 25-Hydroxy: 34.2 ng/mL (ref 30.0–100.0)

## 2024-02-08 LAB — IRON,TIBC AND FERRITIN PANEL
Ferritin: 9 ng/mL — ABNORMAL LOW (ref 15–150)
Iron Saturation: 5 % — CL (ref 15–55)
Iron: 21 ug/dL — ABNORMAL LOW (ref 27–159)
Total Iron Binding Capacity: 417 ug/dL (ref 250–450)
UIBC: 396 ug/dL (ref 131–425)

## 2024-02-08 MED ORDER — IRON (FERROUS SULFATE) 325 (65 FE) MG PO TABS: 325.0000 mg | ORAL_TABLET | Freq: Every day | ORAL | 1 refills | Status: AC

## 2024-02-08 MED ORDER — VALACYCLOVIR HCL 1 G PO TABS: 1000.0000 mg | ORAL_TABLET | Freq: Two times a day (BID) | ORAL | 1 refills | Status: AC

## 2024-02-08 NOTE — Assessment & Plan Note (Signed)
-   Recent labs show a mildly low TSH with normal T4 and T3 levels. Likely transient. No specific symptoms of hyperthyroidism reported, though weight loss was mentioned. - Plan: - Recheck thyroid  function in one month.

## 2024-02-08 NOTE — Progress Notes (Unsigned)
104/66

## 2024-02-08 NOTE — Patient Instructions (Signed)
 It was nice to see you today,  We addressed the following topics today: - Please restart your oral iron  supplement. It is available over the counter, but I have sent a prescription in case your insurance covers it. - I would like you to wear a wrist brace at night on the affected arm. You can buy one over the counter; get one with a metal support to keep your wrist straight. - I have sent a prescription for an antiviral medication for the sore on your nose. Take it for 7-10 days as directed. You can use it for future outbreaks as they occur.  Have a great day,  Rolan Slain, MD

## 2024-02-08 NOTE — Assessment & Plan Note (Signed)
-   Reports new onset severe nocturnal hand pain and numbness, waking from sleep, with some radiation up the arm. Symptoms are suggestive of nerve entrapment, such as carpal tunnel syndrome or cubital tunnel syndrome, despite her belief it is not carpal tunnel. - Plan: - Advised to wear a wrist brace at night to maintain a neutral position. - If no improvement in a few weeks with wrist brace, consider an elbow brace for possible cubital tunnel syndrome. - If conservative measures fail, will refer to orthopedics for evaluation, likely to include nerve conduction studies. - Advised against using pregabalin (Lyrica) due to sedation, especially while on methadone.

## 2024-02-08 NOTE — Assessment & Plan Note (Signed)
-   Lab work from last three months shows progressively improving but persistent anemia. Recent labs confirm low ferritin, indicating iron  deficiency. Symptoms include pagophagia and feeling cold. May contribute to neuropathic symptoms and Raynaud's-like phenomena. History of gastric bypass increases risk of malabsorption. History of bleeding stomach ulcers is a likely cause of iron  loss. - Plan: - Start oral iron  supplementation. A prescription for an OTC formulation was sent. - Counseled that oral iron  can cause GI upset. - Discussed risk of malabsorption due to gastric bypass. - If oral iron  is not tolerated or ineffective, will consider IV iron  infusion, but counseled on the risk of allergic reactions.

## 2024-02-08 NOTE — Assessment & Plan Note (Signed)
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 Current lesion on nose is scabbing over. - Plan: - Prescribed valacyclovir  to be taken for 7-10 days for the current outbreak. - Discussed suppressive therapy vs. as-needed therapy for future outbreaks. Will start with as-needed treatment.

## 2024-02-08 NOTE — Progress Notes (Unsigned)
 Acute Office Visit  Subjective:     Patient ID: Kathy Burns, female    DOB: 1985-11-07, 38 y.o.   MRN: [id number]  Chief Complaint  Patient presents with   Hand Pain    HPI Patient is in today for *** Subjective - Presents for follow-up on several issues, including recurrent skin lesions and new onset hand symptoms.  - Reports recurrent blistering lesions on the nose and sometimes ears, which she believes are similar to a herpes infection she had at age 42. Describes the lesions as blisters that crust over. Current lesion on the nose has been present since yesterday. Triggers include stress. Self-treats with Brevo, which sometimes helps dry them out. Episodes can take a couple of weeks to resolve and often recur shortly after clearing. Reports localized feverish feeling in the area of the lesion but no systemic fever. Her son and husband have had cold sores.  - Reports new onset of severe, nocturnal hand pain and numbness that wakes her from sleep. Began this past weekend. Describes waking up screaming from the pain. Pain can radiate up the arm. Numbness is also present, primarily in the fingers, worse on the ulnar side of the hand. During the day, experiences episodes where fingers turn white, especially with cold exposure, which has been occurring for the past year since significant weight loss. She notes stinging as color returns to her fingers. Symptoms are not prominent during the day, except for the color changes. Denies this is carpal tunnel syndrome, which she has experienced before.  - Reports eating 3-4 cups of ice daily.  - Reports swollen lymph nodes in her stomach.  - Reports flare-ups of stomach ulcers, approximately once a month or with stress. Had an episode over the weekend.  Medications Currently takes methadone. Has taken an antiviral for herpes in the past for about a year around age 5-14. Previously took pregabalin (Lyrica) 75mg  for wrist pain. Has iron   supplements 60mg  at home but is not currently taking them. Stopped previously due to concern for blood clotting, as advised by a doctor.  PMH, PSH, FH, Social Hx PMHx: Herpes simplex virus, stomach ulcers (bleeding), anemia (iron  deficiency), neuropathy symptoms, Raynaud's phenomenon symptoms, tennis elbow, carpal tunnel syndrome. History of elevated hemoglobin in the past. PSHx: Gastric bypass, panniculectomy. Social Hx: Former IV drug user, clean for 3 years. Works as a research officer, trade union.  ROS Constitutional: Denies systemic fever. Reports feeling cold. GI: Denies nausea, vomiting, or diarrhea. Reports history of bleeding stomach ulcers. Reports pagophagia. Neurologic: Reports nocturnal hand pain and numbness, radiating up the arm. Reports intermittent numbness in hands.  Objective SKIN: Scabbed lesion noted on the nose. No active vesicles observed. No lesions noted inside the nostrils. EXTREMITIES: Reports fingers turn white with cold exposure and color returns slowly with rewarming.   ROS      Objective:    BP 104/66   Pulse 66   Ht 5' 5.5 (1.664 m)   Wt 126 lb 1.9 oz (57.2 kg)   SpO2 100%   BMI 20.67 kg/m  {Vitals History (Optional):23777}  Physical Exam Gen: alert, oriented Pulm: no respiratory distress Psych: pleasant affect  No results found for any visits on 02/08/24.      Assessment & Plan:   HSV (herpes simplex virus) infection Assessment & Plan: - History of recurrent blistering lesions on nose and ears consistent with HSV outbreaks. Current lesion on nose is scabbing over. - Plan: - Prescribed valacyclovir  to be taken for 7-10  days for the current outbreak. - Discussed suppressive therapy vs. as-needed therapy for future outbreaks. Will start with as-needed treatment.   Iron  deficiency anemia, unspecified iron  deficiency anemia type Assessment & Plan: - Lab work from last three months shows progressively improving but persistent anemia. Recent labs confirm  low ferritin, indicating iron  deficiency. Symptoms include pagophagia and feeling cold. May contribute to neuropathic symptoms and Raynaud's-like phenomena. History of gastric bypass increases risk of malabsorption. History of bleeding stomach ulcers is a likely cause of iron  loss. - Plan: - Start oral iron  supplementation. A prescription for an OTC formulation was sent. - Counseled that oral iron  can cause GI upset. - Discussed risk of malabsorption due to gastric bypass. - If oral iron  is not tolerated or ineffective, will consider IV iron  infusion, but counseled on the risk of allergic reactions.   Hand paresthesia Assessment & Plan: - Reports new onset severe nocturnal hand pain and numbness, waking from sleep, with some radiation up the arm. Symptoms are suggestive of nerve entrapment, such as carpal tunnel syndrome or cubital tunnel syndrome, despite her belief it is not carpal tunnel. - Plan: - Advised to wear a wrist brace at night to maintain a neutral position. - If no improvement in a few weeks with wrist brace, consider an elbow brace for possible cubital tunnel syndrome. - If conservative measures fail, will refer to orthopedics for evaluation, likely to include nerve conduction studies. - Advised against using pregabalin (Lyrica) due to sedation, especially while on methadone.   Low TSH level Assessment & Plan: - Recent labs show a mildly low TSH with normal T4 and T3 levels. Likely transient. No specific symptoms of hyperthyroidism reported, though weight loss was mentioned. - Plan: - Recheck thyroid  function in one month.   Other orders -     Iron  (Ferrous Sulfate ); Take 325 mg by mouth daily.  Dispense: 90 tablet; Refill: 1 -     valACYclovir  HCl; Take 1 tablet (1,000 mg total) by mouth 2 (two) times daily for 20 days.  Dispense: 20 tablet; Refill: 1     No follow-ups on file.  Toribio MARLA Slain, MD

## 2024-02-13 ENCOUNTER — Ambulatory Visit: Payer: Self-pay

## 2024-02-14 NOTE — Telephone Encounter (Signed)
-----   Message from Saddie JULIANNA Sacks sent at 02/13/2024  6:50 PM EST ----- Can patient come in in 4 weeks for us  to recheck thyroid  panel and iron  panel please? Thank you

## 2024-02-14 NOTE — Telephone Encounter (Signed)
Called and scheduled lab appointment

## 2024-02-18 ENCOUNTER — Ambulatory Visit (HOSPITAL_COMMUNITY)
Admission: RE | Admit: 2024-02-18 | Discharge: 2024-02-18 | Disposition: A | Discharge status: Home / Self Care | Source: Ambulatory Visit | Attending: Surgery | Admitting: Surgery

## 2024-02-18 ENCOUNTER — Ambulatory Visit

## 2024-02-18 VITALS — BP 114/69 | HR 56 | Temp 98.1°F | Wt 121.9 lb

## 2024-02-18 DIAGNOSIS — I83812 Varicose veins of left lower extremities with pain: Secondary | ICD-10-CM | POA: Diagnosis not present

## 2024-02-18 DIAGNOSIS — I872 Venous insufficiency (chronic) (peripheral): Secondary | ICD-10-CM | POA: Insufficient documentation

## 2024-02-18 DIAGNOSIS — M7989 Other specified soft tissue disorders: Secondary | ICD-10-CM | POA: Insufficient documentation

## 2024-02-18 NOTE — Progress Notes (Signed)
 " Office Note     CC:  follow up Requesting Provider:  Jaycee Greig PARAS, NP  HPI: Kathy Burns is a 38 y.o. 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  She is a former IV drug user who used to inject heroin into the varicosities of her left calf.  She has turned her life around and more weights than 1.  She had gastric bypass surgery in 2023 and has lost 180 pounds since that time.  She had a panniculectomy In August of this year due to her drastic weight loss.  She complains of pain and worsening of these varicosities in her left calf.  She has not worn compression socks or focus on leg elevation.  She denies DVT, venous ulcerations, trauma, or prior vascular interventions.  She smokes cigarettes.  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 02/14/2019   Last Assessment & Plan:    Patient is a 38 yo G4P3003 at [redacted]w[redacted]d with a pregnancy complicated by multiple fetal anomalies, potentially compromising the airway, admitted for extended monitoring and steroids in the setting of BPP 6/10 on day of admission at the MFM office       -  BMZ#1 on 12/15 at 2203, BMZ#2 on 12/16 at 1009   - FHT: category I throughout inpatient admission   - 12/16: BPP 8/8     Past Surgical History:  Procedure Laterality Date   CESAREAN SECTION     x 4   CHOLECYSTECTOMY     GASTRIC BYPASS     IR IMAGE GUIDED DRAINAGE BY PERCUTANEOUS CATHETER  11/19/2023    PANNICULECTOMY N/A 10/11/2023   Procedure: PANNICULECTOMY;  Surgeon: Waddell Leonce NOVAK, MD;  Location: [facility];  Service: Plastics;  Laterality: N/A;   REMOVAL OF EAR TUBE     TEMPOROMANDIBULAR JOINT ARTHROPLASTY     TONSILLECTOMY AND ADENOIDECTOMY     TUBAL LIGATION      Social History   Socioeconomic History   Marital status: Legally Separated    Spouse name: Not on file   Number of children: 4   Years of education: Not on file   Highest education level: High school graduate  Occupational History   Occupation: Surveyor, mining  Tobacco Use   Smoking status: Every Day    Current packs/day: 0.00    Types: Cigarettes    Last attempt to quit: 2023    Years since quitting: 2.9    Passive exposure: Current   Smokeless tobacco: Never  Vaping Use   Vaping status: Every Day   Substances: Nicotine , Flavoring  Substance and Sexual Activity   Alcohol use: Yes    Comment: a pint or more a day -beer and liquor   Drug use: Not Currently    Types: Marijuana, Cocaine, Heroin    Comment: 02/14/2013 last use, heroine last  use 11/21/14   Sexual activity: Yes    Birth control/protection: None  Other Topics Concern   Not on file  Social History Narrative   Not on file   Social Drivers of Health   Tobacco Use: High Risk (02/18/2024)   Patient History    Smoking Tobacco Use: Every Day    Smokeless Tobacco Use: Never    Passive Exposure: Current  Financial Resource Strain: Low Risk (12/08/2022)   Overall Financial Resource Strain (CARDIA)    Difficulty of Paying Living Expenses: Not hard at all  Food Insecurity: No Food Insecurity (12/08/2022)   Hunger Vital Sign    Worried About Running Out of Food in the Last Year: Never true    Ran Out of Food in the Last Year: Never true  Transportation Needs: No Transportation Needs (12/08/2022)   PRAPARE - Administrator, Civil Service (Medical): No    Lack of Transportation (Non-Medical): No  Physical Activity:  Sufficiently Active (12/08/2022)   Exercise Vital Sign    Days of Exercise per Week: 5 days    Minutes of Exercise per Session: 30 min  Stress: No Stress Concern Present (12/08/2022)   [employer] of Occupational Health - Occupational Stress Questionnaire    Feeling of Stress : Only a little  Social Connections: Unknown (12/15/2022)   Received from [facility]   Social Network    Social Network: Not on file  Recent Concern: Social Connections - Moderately Isolated (12/08/2022)   Social Connection and Isolation Panel    Frequency of Communication with Friends and Family: More than three times a week    Frequency of Social Gatherings with Friends and Family: More than three times a week    Attends Religious Services: More than 4 times per year    Active Member of [employer] or Organizations: No    Attends Banker Meetings: Never    Marital Status: Separated  Intimate Partner Violence: Unknown (12/15/2022)   Received from [facility]   HITS    Physically Hurt: Not on file    Insult or Talk Down To: Not on file    Threaten Physical Harm: Not on file    Scream or Curse: Not on file  Depression (PHQ2-9): Medium Risk (01/25/2024)   Depression (PHQ2-9)    PHQ-2 Score: 6  Alcohol Screen: Low Risk (12/08/2022)   Alcohol Screen    Last Alcohol Screening Score (AUDIT): 5  Housing: Low Risk (12/08/2022)   Housing    Last Housing Risk Score: 0  Utilities: Not At Risk (12/08/2022)   AHC Utilities    Threatened with loss of utilities: No  Health Literacy: Adequate Health Literacy (12/08/2022)   B1300 Health Literacy    Frequency of need for help with medical instructions: Never    Family History  Problem Relation Age of Onset   Ovarian cancer Mother    Heart disease Maternal Grandmother    Heart disease Maternal Grandfather    Breast cancer Paternal Grandmother    Esophageal cancer Paternal Grandfather    Hypertension Other    Diabetes Other    Cancer Other    Stroke  Other    Breast cancer Paternal Aunt     Current Outpatient Medications  Medication Sig Dispense Refill   albuterol  (VENTOLIN  HFA) 108 (90 Base) MCG/ACT inhaler Inhale 1-2 puffs into the lungs every 6 (six) hours as needed. 8 g 2   DULoxetine  (CYMBALTA ) 20 MG capsule Take 1 capsule (20 mg total)  by mouth daily. 90 capsule 3   Iron , Ferrous Sulfate , 325 (65 Fe) MG TABS Take 325 mg by mouth daily. 90 tablet 1   lidocaine  (LIDODERM ) 5 % Place 1 patch onto the skin daily. Remove & Discard patch within 12 hours or as directed by MD 30 patch 0   methadone (DOLOPHINE) 10 MG tablet Take 10 mg by mouth every 8 (eight) hours.     ondansetron  (ZOFRAN -ODT) 4 MG disintegrating tablet Take 1 tablet (4 mg total) by mouth every 8 (eight) hours as needed for nausea or vomiting. 20 tablet 0   pantoprazole  (PROTONIX ) 40 MG tablet Take 1 tablet (40 mg total) by mouth daily. 60 tablet 0   sucralfate  (CARAFATE ) 1 g tablet Take 1 tablet (1 g total) by mouth 4 (four) times daily. 30 tablet 0   trimethoprim  (TRIMPEX ) 100 MG tablet Take 100 mg by mouth daily.     valACYclovir  (VALTREX ) 1000 MG tablet Take 1 tablet (1,000 mg total) by mouth 2 (two) times daily for 20 days. 20 tablet 1   No current facility-administered medications for this visit.    Allergies[1]   REVIEW OF SYSTEMS:  Negative unless noted in HPI [X]  denotes positive finding, [ ]  denotes negative finding Cardiac  Comments:  Chest pain or chest pressure:    Shortness of breath upon exertion:    Short of breath when lying flat:    Irregular heart rhythm:        Vascular    Pain in calf, thigh, or hip brought on by ambulation:    Pain in feet at night that wakes you up from your sleep:     Blood clot in your veins:    Leg swelling:         Pulmonary    Oxygen at home:    Productive cough:     Wheezing:         Neurologic    Sudden weakness in arms or legs:     Sudden numbness in arms or legs:     Sudden onset of difficulty speaking or  slurred speech:    Temporary loss of vision in one eye:     Problems with dizziness:         Gastrointestinal    Blood in stool:     Vomited blood:         Genitourinary    Burning when urinating:     Blood in urine:        Psychiatric    Major depression:         Hematologic    Bleeding problems:    Problems with blood clotting too easily:        Skin    Rashes or ulcers:        Constitutional    Fever or chills:      PHYSICAL EXAMINATION:  Vitals:   02/18/24 1238  BP: 114/69  Pulse: (!) 56  Temp: 98.1 F (36.7 C)  TempSrc: Temporal  Weight: 121 lb 14.4 oz (55.3 kg)    General:  WDWN in NAD; vital signs documented above Gait: Not observed HENT: WNL, normocephalic Pulmonary: normal non-labored breathing Cardiac: regular HR Abdomen: soft, NT, no masses Skin: without rashes Vascular Exam/Pulses: Palpable DP pulses Extremities: Ropey varicosities of the left calf Musculoskeletal: no muscle wasting or atrophy  Neurologic: A&O X 3 Psychiatric:  The pt has Normal affect.   Non-Invasive Vascular Imaging:   Left lower extremity venous reflux study negative  for DVT Incompetent common femoral vein Incompetent GSV at the saphenofemoral junction Incompetent and dilated small saphenous vein which measures 1.2 cm at the saphenous popliteal junction, 7.6 mm in the proximal calf and 5.1 mm in the mid calf   ASSESSMENT/PLAN:: 38 y.o. female here for evaluation for symptomatic varicose veins in her left calf  Ms. Kathy Burns is a 38 year old female with symptomatic varicosities of the left calf which cause her discomfort which worsens over the course of the day.  Left lower extremity venous reflux study was negative for DVT however did demonstrate venous insufficiency including a largely incompetent and dilated small saphenous vein which likely gives rise to the varicosities of the left calf.  She may be a candidate for laser ablation therapy of the small saphenous vein.  She  was measured and prescribed 20 to 30 mmHg thigh-high compression socks to wear on a daily basis.  We also discussed proper leg elevation to be performed periodically during the day.  We also discussed avoidance of prolonged sitting and standing.  She will return in about 3 months to discuss small saphenous vein ablation therapy and possible stab phlebectomy with Dr. Serene or Dr. Sheree.   Kathy Sender, [name] [facility] [phone]  Clinic MD:   [place]     [1]  Allergies Allergen Reactions   Brassica Oleracea Anaphylaxis, Itching, Swelling and Other (See Comments)    Only raw broccoli causes reaction:  Tongue swelling with raw broccoli   Tongue swelling with raw broccoli   Other reaction(s): Other- (not listed) - Allergy  Tongue swelling with raw broccoli   Tylenol  [Acetaminophen ] Palpitations   Codeine Itching   "

## 2024-03-02 ENCOUNTER — Telehealth: Payer: MEDICAID | Admitting: Physician Assistant

## 2024-03-02 DIAGNOSIS — J020 Streptococcal pharyngitis: Secondary | ICD-10-CM

## 2024-03-02 MED ORDER — AMOXICILLIN 500 MG PO CAPS: 500.0000 mg | ORAL_CAPSULE | Freq: Two times a day (BID) | ORAL | 0 refills | Status: AC

## 2024-03-02 NOTE — Progress Notes (Signed)

## 2024-03-10 ENCOUNTER — Ambulatory Visit

## 2024-03-10 ENCOUNTER — Other Ambulatory Visit: Payer: Self-pay

## 2024-03-10 DIAGNOSIS — D509 Iron deficiency anemia, unspecified: Secondary | ICD-10-CM

## 2024-03-10 DIAGNOSIS — R7989 Other specified abnormal findings of blood chemistry: Secondary | ICD-10-CM

## 2024-03-13 ENCOUNTER — Other Ambulatory Visit

## 2024-03-13 DIAGNOSIS — D509 Iron deficiency anemia, unspecified: Secondary | ICD-10-CM

## 2024-03-13 DIAGNOSIS — R7989 Other specified abnormal findings of blood chemistry: Secondary | ICD-10-CM

## 2024-03-14 LAB — IRON,TIBC AND FERRITIN PANEL
Ferritin: 16 ng/mL (ref 15–150)
Iron Saturation: 8 % — CL (ref 15–55)
Iron: 32 ug/dL (ref 27–159)
Total Iron Binding Capacity: 384 ug/dL (ref 250–450)
UIBC: 352 ug/dL (ref 131–425)

## 2024-03-14 LAB — THYROID PANEL WITH TSH
Free Thyroxine Index: 1.8 (ref 1.2–4.9)
T3 Uptake Ratio: 30 % (ref 24–39)
T4, Total: 6 ug/dL (ref 4.5–12.0)
TSH: 1.5 u[IU]/mL (ref 0.450–4.500)

## 2024-03-15 ENCOUNTER — Ambulatory Visit: Payer: Self-pay

## 2024-04-26 ENCOUNTER — Ambulatory Visit
